# Patient Record
Sex: Male | Born: 1935 | Race: White | Hispanic: No | Smoking: Former smoker
Health system: Southern US, Community
[De-identification: ages and names within clinical notes are randomized; demographics above are authoritative.]

## PROBLEM LIST (undated history)

## (undated) DIAGNOSIS — H269 Unspecified cataract: Secondary | ICD-10-CM

## (undated) DIAGNOSIS — T7840XA Allergy, unspecified, initial encounter: Secondary | ICD-10-CM

## (undated) DIAGNOSIS — M1990 Unspecified osteoarthritis, unspecified site: Secondary | ICD-10-CM

## (undated) DIAGNOSIS — K219 Gastro-esophageal reflux disease without esophagitis: Secondary | ICD-10-CM

## (undated) HISTORY — DX: Unspecified osteoarthritis, unspecified site: M19.90

## (undated) HISTORY — PX: WRIST ARTHROPLASTY: SHX1088

## (undated) HISTORY — PX: REPLACEMENT TOTAL KNEE BILATERAL: SUR1225

## (undated) HISTORY — PX: TOTAL HIP ARTHROPLASTY: SHX124

## (undated) HISTORY — DX: Allergy, unspecified, initial encounter: T78.40XA

## (undated) HISTORY — DX: Gastro-esophageal reflux disease without esophagitis: K21.9

## (undated) HISTORY — DX: Unspecified cataract: H26.9

---

## 2012-10-10 ENCOUNTER — Ambulatory Visit: Payer: Self-pay | Admitting: Orthopedic Surgery

## 2012-10-10 DIAGNOSIS — I10 Essential (primary) hypertension: Secondary | ICD-10-CM

## 2012-10-10 LAB — BASIC METABOLIC PANEL
Anion Gap: 5 — ABNORMAL LOW (ref 7–16)
Calcium, Total: 8.8 mg/dL (ref 8.5–10.1)
Chloride: 105 mmol/L (ref 98–107)
Creatinine: 0.83 mg/dL (ref 0.60–1.30)
EGFR (Non-African Amer.): >60
Glucose: 131 mg/dL — ABNORMAL HIGH (ref 65–99)

## 2012-10-10 LAB — PROTIME-INR: Prothrombin Time: 13.5 secs (ref 11.5–14.7)

## 2012-10-10 LAB — CBC
HCT: 41.6 % (ref 40.0–52.0)
MCH: 30.6 pg (ref 26.0–34.0)
MCHC: 35.3 g/dL (ref 32.0–36.0)
Platelet: 241 10*3/uL (ref 150–440)
RDW: 13.2 % (ref 11.5–14.5)

## 2012-10-10 LAB — MRSA PCR SCREENING

## 2012-10-27 ENCOUNTER — Inpatient Hospital Stay: Payer: Self-pay | Admitting: Orthopedic Surgery

## 2012-10-28 LAB — BASIC METABOLIC PANEL
Anion Gap: 1 — ABNORMAL LOW (ref 7–16)
BUN: 10 mg/dL (ref 7–18)
Calcium, Total: 8.2 mg/dL — ABNORMAL LOW (ref 8.5–10.1)
Chloride: 107 mmol/L (ref 98–107)
Creatinine: 0.84 mg/dL (ref 0.60–1.30)
EGFR (African American): >60
EGFR (Non-African Amer.): >60
Glucose: 98 mg/dL (ref 65–99)
Osmolality: 277 (ref 275–301)
Potassium: 4 mmol/L (ref 3.5–5.1)

## 2012-10-28 LAB — PLATELET COUNT: Platelet: 188 10*3/uL (ref 150–440)

## 2012-11-01 LAB — PATHOLOGY REPORT

## 2013-03-13 ENCOUNTER — Ambulatory Visit: Payer: Self-pay | Admitting: Gastroenterology

## 2013-03-15 LAB — PATHOLOGY REPORT

## 2013-03-28 ENCOUNTER — Ambulatory Visit: Payer: Self-pay | Admitting: Orthopedic Surgery

## 2013-04-10 ENCOUNTER — Ambulatory Visit: Payer: Self-pay | Admitting: Orthopedic Surgery

## 2013-04-10 LAB — URINALYSIS, COMPLETE
Bacteria: NONE SEEN
Bilirubin,UR: NEGATIVE
Glucose,UR: NEGATIVE mg/dL (ref 0–75)
Ketone: NEGATIVE
Leukocyte Esterase: NEGATIVE
Ph: 5 (ref 4.5–8.0)
RBC,UR: 1 /HPF (ref 0–5)
Specific Gravity: 1.025 (ref 1.003–1.030)
Squamous Epithelial: <1

## 2013-04-10 LAB — PROTIME-INR: INR: 1.1

## 2013-04-10 LAB — CBC
HCT: 43.1 % (ref 40.0–52.0)
HGB: 15.1 g/dL (ref 13.0–18.0)
MCHC: 35 g/dL (ref 32.0–36.0)
Platelet: 235 10*3/uL (ref 150–440)
RDW: 14.1 % (ref 11.5–14.5)
WBC: 7.5 10*3/uL (ref 3.8–10.6)

## 2013-04-10 LAB — BASIC METABOLIC PANEL
Calcium, Total: 8.9 mg/dL (ref 8.5–10.1)
Chloride: 106 mmol/L (ref 98–107)
Co2: 33 mmol/L — ABNORMAL HIGH (ref 21–32)
EGFR (African American): >60
EGFR (Non-African Amer.): >60
Osmolality: 279 (ref 275–301)
Potassium: 3.9 mmol/L (ref 3.5–5.1)
Sodium: 139 mmol/L (ref 136–145)

## 2013-04-10 LAB — SEDIMENTATION RATE: Erythrocyte Sed Rate: 2 mm/hr (ref 0–20)

## 2013-04-10 LAB — APTT: Activated PTT: 29 secs (ref 23.6–35.9)

## 2013-04-25 ENCOUNTER — Inpatient Hospital Stay: Payer: Self-pay | Admitting: Orthopedic Surgery

## 2013-04-26 LAB — BASIC METABOLIC PANEL
Calcium, Total: 7.8 mg/dL — ABNORMAL LOW (ref 8.5–10.1)
Creatinine: 1.02 mg/dL (ref 0.60–1.30)
EGFR (Non-African Amer.): >60
Glucose: 97 mg/dL (ref 65–99)
Potassium: 3.8 mmol/L (ref 3.5–5.1)
Sodium: 139 mmol/L (ref 136–145)

## 2013-04-27 LAB — PATHOLOGY REPORT

## 2014-09-28 NOTE — Discharge Summary (Signed)
PATIENT NAME:  Donald Conley, Donald Conley MR#:  [id number] DATE OF BIRTH:  10/02/1935  DATE OF ADMISSION:  04/25/2013 DATE OF DISCHARGE:  04/27/2013  ADMITTING DIAGNOSIS: Left knee osteoarthritis.   DISCHARGE DIAGNOSIS: Left knee osteoarthritis.   PROCEDURE: Left total knee replacement.   SURGEON: Leitha Schuller, MD/[facility].   ESTIMATED BLOOD LOSS: 50 mL.   COMPLICATIONS: None.   SPECIMEN: Cut ends of bone.   TOURNIQUET TIME: 74 minutes at 300 mmHg.  IMPLANTS: Medacta GMK sphere, left 5 fixed tibial tray and a femoral left 5 sphere femoral component with a 11 x 65 extension stem, a size 5 left 10 mm Flex tibial insert, and a size 4 cemented patellar.   CONDITION: To recovery room stable.   COMPLICATIONS: None.  HISTORY: The patient is a 79 year old male who had a previous My Knee CT that showed severe osteoarthritis of the knee. He follows up to discuss surgery and schedule it. He has had persisting pain that has limited his activities of daily living.   PHYSICAL EXAMINATION: Lungs clear. Heart regular rate and rhythm. HEENT remarkable for full upper and lower dentures. On exam, he has 5 to 115 degrees with varus deformity. There is trace dorsalis pedis and posterior tibialis pulse. He has crepitation to the medial and patellofemoral joint as well as mild effusion. His varus deformity is passively correctable.   HOSPITAL COURSE: The patient was admitted to the hospital on 04/25/2013. He had surgery that same day and was brought to the orthopedic floor from the PAC-U in stable condition. On postop day 1, the patient progressed very well with physical therapy had ambulation of 280 feet and 79 degrees range of motion. His pain was well controlled. Vital signs and labs were stable. On postop day 2, the patient continued to progress with physical therapy. He was safe enough to return home with home physical therapy. He did have a bowel movement and he was ready for discharge to home with  home health PT.  CONDITION AT DISCHARGE: Stable.   DISCHARGE INSTRUCTIONS: The patient may gradually increase weight-bearing on the affected extremity and he is to elevate the affected foot or leg on 1 or 2 pillows with the foot higher than the knee. Thigh-high TED hose on both legs and remove at bedtime and replace when arising the next morning. Elevate the heels off the bed. Use incentive spirometry every hour while awake and encourage cough and deep breathing. He may resume a regular diet as tolerated. Continue using Polar Care unit maintaining a temp between 40 and 50 degrees. Do not get the dressing or bandage wet or dirty. Call [facility] orthopedics if the dressing gets water under it. Leave the dressing on. Call [facility] orthopedics if any of the following occur: Bright red bleeding from the incision or wound, fever above 101.5 degrees, redness, swelling, or drainage at the incision. Call [facility] orthopedics if you experience any increased leg pain, numbness or weakness in your legs or bowel or bladder symptoms. Home physical therapy has been arranged for continuation of rehab program. Please call [facility] orthopedics if a therapist has not contacted you within 48 hours of your return home. Call [facility] orthopedics for follow-up appointment in 2 weeks.   DISCHARGE MEDICATIONS: 1.  Fexofenadine 180 mg oral tablet 1 tablet orally once a day. 2.  Vitamin C 500 mg 1 tablet orally once a day. 3.  Calcium 600 plus D 600 mg/200 units of oral tablet 1 tablet orally  once a day. 4.  Doxazosin 8 mg 1 tab orally once a day. 5.  Fish oil 1200 mg 1 cap orally once a day. 6.  Bayer 81 mg 1 tab orally once a day. 7.  Finasteride 5 mg 1 tablet orally once a day in the morning. 8.  Simvastatin 40 mg 1/2 tab orally once a day in the morning. 9.  Omeprazole 20 mg oral delayed-release capsule 1 cap orally once a day in the morning. 10.  Tylenol 500 mg oral tablet 1 tablet orally  every 4 hours needed for pain or temp greater than 100.4.  11.  Oxycodone 5 mg 1 tablet orally every 4 hours as needed for pain. 12.  Milk of magnesia 30 mL orally 2 times a day as needed for constipation. 13.  Xarelto 10 mg oral tablet 1 tablet orally once a day in the morning x10 days. 14.  Bisacodyl 10 mg rectal suppository 1 suppository rectally once a day as needed for constipation.   ____________________________ T. Cranston Neighborhris Jeane Cashatt, PA-C tcg:sb D: 04/27/2013 10:25:27 ET T: 04/27/2013 10:52:04 ET JOB#: [id number]  cc: T. Cranston Neighborhris Bensyn Bornemann, PA-C, <Dictator> Evon SlackHOMAS C Jacobo Moncrief GeorgiaPA ELECTRONICALLY SIGNED 04/27/2013 13:18

## 2014-09-28 NOTE — Op Note (Signed)
PATIENT NAME:  Donald Conley, Donald Conley MR#:  [id number] DATE OF BIRTH:  [birth year]  DATE OF PROCEDURE:  04/25/2013  SURGEON: Kennedy Bucker, M.D.   ASSISTANT:  Cranston Neighbor, PA-C.   ANESTHESIA: Spinal.   PREOPERATIVE DIAGNOSIS: Left knee osteoarthritis.   POSTOPERATIVE DIAGNOSIS: Left knee osteoarthritis.   PROCEDURE: Left total knee replacement.   DESCRIPTION OF PROCEDURE: The patient was brought to the Operating Room and after adequate anesthesia was obtained, the left leg was prepped and draped in the usual sterile fashion with the tourniquet applied to the upper thigh. After patient identification and timeout procedures were completed and having prepped and draped in the usual sterile manner, the leg was exsanguinated with an Esmarch and the tourniquet raised to 300 mmHg. A skin incision was made over the anterior knee in the midline, followed by a medial parapatellar arthrotomy. Inspection revealed significant medial and lateral compartment degenerative change and significant loss of bone of the lateral facet of the patella with approximately half the thickness of the patella being worn. The anterior cruciate ligament and infrapatellar fat pad were excised. The proximal tibia was exposed to allow for application of the proximal tibia cutting block after excising the anterior horns of the menisci. The proximal tibia cut was carried out and most of the bone removed. Attention was then turned to the femur. Cartilage removed and the femoral cutting guide applied, drill holes made and the distal femoral cuts made. A 5 cutting block was applied, anterior and posterior chamfer cuts made without notching. The residual horns of the menisci were removed at this time along with remaining bone off the tibia. The tibial trial was placed and pinned in place, proximal drill hole made. The tibial bone was very soft, and it was deemed necessary to put a stem in to try to prevent subsidence. Preparation was done for the  stemmed implant. After this had been done, the trial was placed and followed by the 5 femur. A 10 mm insert gave excellent stability through range of motion with appropriate laxity on the lateral side and a 10 mm component was chosen. Distal femoral drill holes were made and the notch cut made with the trochlear cutting guide off of the femur. All trials were removed at this time and the patella was cut using the cutting guide. After drill holes were made a size 4 patella gave good coverage to the patella. Injection was made at this point with 0.5% Sensorcaine with morphine and Toradol and a mixture of saline in the periarticular tissue along with Exparel injection diluted with 40 mL of saline in the posterior knee as well as along the arthrotomy. Following this, the tourniquet was let down to check so hemostasis could be checked The tourniquet was then raised again. The bony surfaces thoroughly irrigated and dried. The components were then cemented into place first, first placing the tibial component. It had about 1 mm of overhang anteromedially, but otherwise had very good coverage. The tibial insert was then placed with the set screw, followed by the femoral component with excess cement removed. The knee was held in extension and full extension was obtained. The patellar button was clamped into place as well with cement to hold all components. After the cement had set, excess cement was removed and the knee was again thoroughly irrigated. The tourniquet was let down and the arthrotomy repaired using a heavy quill suture with excellent patellofemoral tracking, 2 - 0 quill subcutaneously and skin staples. Xeroform, 4 x 4's, ABD, Webril,  Polar Care and Ace wrap applied along with a knee immobilizer. The patient was sent to the recovery room in stable condition.   ESTIMATED BLOOD LOSS: 50 mL.   COMPLICATIONS: None.   SPECIMEN: Cut ends of bone.   TOURNIQUET TIME: 74 minutes at 300 mmHg.   IMPLANTS: Medacta  GMK Sphere left 5 fixed tibial tray and femoral left 5 Sphere femoral component with 11 x 65 extension stem, a size 5 left 10 mm flex tibial insert, and a size 4 cemented patella.   CONDITION: To recovery room, stable.   COMPLICATIONS: None.    ____________________________ Leitha SchullerMichael J. Ivyanna Sibert, MD mjm:cc D: 04/25/2013 21:06:20 ET T: 04/25/2013 21:43:26 ET JOB#: [id number]  cc: Leitha SchullerMichael J. Mallika Sanmiguel, MD, <Dictator> Leitha SchullerMICHAEL J Boomer Winders MD ELECTRONICALLY SIGNED 04/26/2013 8:16

## 2014-09-28 NOTE — Discharge Summary (Signed)
PATIENT NAME:  Donald Conley, Donald Conley MR#:  [id number] DATE OF BIRTH:  January 05, 1936  DATE OF ADMISSION:  10/27/2012 DATE OF DISCHARGE:  10/29/2012   ADMITTING DIAGNOSIS: Degenerative arthritis, left hip.   DISCHARGE DIAGNOSIS:  Degenerative arthritis, left hip.  OPERATION: On 10/27/2012, he had a left total hip arthroplasty.   SURGEON: Dr. Hessie Knows.  ANESTHESIA: Spinal.   ESTIMATED BLOOD LOSS: 250 mL.   DRAINS: None.   COMPLICATIONS: None.   IMPLANTS:  Medacta 3 Amis H stem, M head with 56 mm DM  liner and cup.  HISTORY: This patient is a 79 year old male who failed conservative measures for left hip degenerative arthritis. He consented for a total hip arthroplasty.   PHYSICAL EXAMINATION: LUNGS: Clear to auscultation.  HEART: Regular rate and rhythm.  HEENT: Has full upper and lower dentures.  MUSCULOSKELETAL: 10 degrees external rotation and contracture. The left hip can be actually rotated about 10 additional degrees. There is a prior right total knee with 0 to 20 degrees range of motion with stable knee and good result. On lumbar spine exam is limited flexion-extension, but no radicular symptoms with straight leg raising. No clonus.   HOSPITAL COURSE: After an initial admission on 10/27/2012, he underwent a total hip arthroplasty the same day. On postoperative day #1, 10/28/2012, his hemoglobin was 12.7 and platelets 188. He began physical therapy had good progress with therapy. He had one episode of emesis in the evening that he thought was possibly related to salad he ate. On postoperative day #2, he met his goals with physical therapy. No labs were drawn that day. He had a temperature overnight of 101.4, but this improved quickly without intervention. He tolerated therapy well on postoperative day #2, and has met his goals and is ready for discharge home.   CONDITION AT DISCHARGE: Stable.   DISPOSITION: The patient was sent home.   DISCHARGE INSTRUCTIONS: The patient will follow  up with [facility] orthopedics in 2 weeks for staple removal. He will do physical therapy and may weight-bear as tolerated on the left lower extremity. His diet is regular. TED hose knee-high bilaterally. Dressing can be changed once daily on an as needed basis.   DISCHARGE MEDICATIONS: 1.  Fexofenadine 100 mg oral tablet 1 tab p.o. once daily.  2.  Vitamin C with rose hips 500 mg oral tablet 1 tab p.o. once daily.  3.  Aspirin low dose 81 mg oral delayed release 1 tab p.o. once daily.  4.  Fish oil 1200 mg oral capsule 2 oral p.o. once daily.  5.  Omeprazole 20 mg oral delayed release one cap p.o. once daily.  6.  Calcium 600+ D 20o one tabs p.o. once daily.  7.  Finasteride 5 mg 1 tab p.o. once daily.  8.  Simvastatin 40 mg 1 tab p.o. once daily.  9.  Doxazosin 80 mg 1 tab p.o. at bedtime.  10.  Acetaminophen 500 mg 1 tab p.o. q.4h. p.r.n. pain or temperature greater than 100.4. 11.  11.  11.  Oxycodone 50 mg 1 tab p.o. q.4h. p.r.n. pain. (This prescription was placed in the chart).  12.  Magnesium hydroxide 8% oral suspension 30 mg p.o. 2 times daily p.r.n. constipation.  13. Aluminum hydroxide/magnesium hydroxide/stomach simethicone 400/400/40/5 mL oral suspension 30 mg p.o. q.6 hours p.r.n. indigestion or heartburn.  14.  Rivaroxaban 10 mg oral tablet 1 tab p.o. once daily q.a.m., x 33 days.  15.  Zolpidem 5 mg 1 tab p.o. at bedtime p.r.n. insomnia.  16.  Bisacodyl 10 mg rectal suppository, 1 tab p.o. once daily p.r.n. constipation.  17.  Docusate/senna 50 mg/8.6 mg 1 tab p.o. twice daily.    ____________________________ Kendarious Gudino M. Tretha Sciara, NP amb:ct D: 10/29/2012 08:31:15 ET T: 10/29/2012 09:46:41 ET JOB#: [id number]  cc: Domitila Stetler M. Tretha Sciara, NP, <Dictator> Laurene Footman, MD [place] FNP ELECTRONICALLY SIGNED 11/17/2012 9:36

## 2014-09-28 NOTE — Op Note (Signed)
PATIENT NAME:  Donald Conley, Donald MR#:  [id number] DATE OF BIRTH:  [birth year]  DATE OF PROCEDURE:  10/27/2012  PREOPERATIVE DIAGNOSIS: Left hip osteoarthritis.   POSTOPERATIVE DIAGNOSIS: Left hip osteoarthritis.   PROCEDURE: Left total hip replacement, anterior approach.   SURGEON: Leitha SchullerMichael J. Cottrell Gentles, MD  ASSISTANT:  Devota PaceApril Berndt, nurse practitioner.   ANESTHESIA: Spinal.   DESCRIPTION OF PROCEDURE: The patient was brought to the operating room and, after adequate spinal anesthesia was obtained, the patient was placed on the operative table with the right leg in a well legholder, left leg in the Medacta attachment. C-arm was brought in and preprocedure picture taken of the left hip. The hip was then prepped and draped using the usual sterile method. Appropriate patient identification and timeout procedures were completed. An anterolateral incision was then made over the tensor fascia muscle with the fascia incised and the muscle retracted laterally. Deep fascia was incised and the rectus sheath opened. The anterior circumflex vessels were identified and ligated. The anterior capsule was then opened with arthrotomy, opening the capsule, retractors were placed, and the femoral neck cut made. The head was then removed and there was severe degenerative change on the head and sclerotic bone around the entire acetabulum. Anterior labrum was excised and sequential reaming was carried out to 56 mm at which point there was good bleeding bone. The 56 mm trial fit well and the 56 cup was then impacted into place and was very stable with appropriate anteversion on imaging during the procedure. At this point, the pubofemoral and ischiofemoral pubofemoral ligaments were released. The leg was externally rotated and placed in extension and adduction. This provided exposure of the proximal femur. The proximal femur was opened with a box osteotome and rasp. Sequential broaching was carried out and the #3 gave a tight fit in  the canal with good rotational stability. Trials were made off of this and then a #3 stem impacted down the canal. Repeat trial was made with a medium head and leg lengths appeared equal so the medium head with 56 mm dual mobility liner were assembled, and the acetabulum checked and clear of any debris, thoroughly irrigated. The hip was reduced and stable to 90 degrees external rotation and leg lengths again appeared to be appropriate. The wound was irrigated.  Local and postop pain medication was infiltrated in the subcutaneous tissue with 30 mL of 0.25% Sensorcaine with epinephrine, Toradol, and morphine mixed with saline. The deep fascia was repaired using a heavy quill suture, 2-0 quill subcutaneously, and skin staples. Xeroform, 4 x 4's, ABD, and tape applied. The patient was sent to the recovery room in stable condition.   ESTIMATED BLOOD LOSS: 250 mL.   COMPLICATIONS: None.   SPECIMENS: Femoral head.   IMPLANTS: Medacta 3 Amis H stem with a 56 mm Versafit cup DM with a 56 mm dual mobility liner and a M 28 mm femoral head.  ____________________________ Leitha SchullerMichael J. Devarious Pavek, MD mjm:sb D: 10/27/2012 14:14:20 ET T: 10/27/2012 15:13:25 ET JOB#: [id number]  cc: Leitha SchullerMichael J. Laraina Sulton, MD, <Dictator> Leitha SchullerMICHAEL J Sherlin Sonier MD ELECTRONICALLY SIGNED 10/27/2012 17:35

## 2017-10-25 ENCOUNTER — Other Ambulatory Visit: Payer: Self-pay | Admitting: Internal Medicine

## 2017-10-25 ENCOUNTER — Ambulatory Visit
Admission: RE | Admit: 2017-10-25 | Discharge: 2017-10-25 | Disposition: A | Discharge status: Home / Self Care | Payer: Medicare Other | Source: Ambulatory Visit | Attending: Internal Medicine | Admitting: Internal Medicine

## 2017-10-25 ENCOUNTER — Encounter (INDEPENDENT_AMBULATORY_CARE_PROVIDER_SITE_OTHER): Payer: Self-pay

## 2017-10-25 DIAGNOSIS — R600 Localized edema: Secondary | ICD-10-CM | POA: Insufficient documentation

## 2017-10-26 ENCOUNTER — Other Ambulatory Visit
Admission: RE | Admit: 2017-10-26 | Discharge: 2017-10-26 | Disposition: A | Discharge status: Home / Self Care | Payer: Medicare Other | Source: Ambulatory Visit | Attending: Internal Medicine | Admitting: Internal Medicine

## 2017-10-26 ENCOUNTER — Ambulatory Visit (HOSPITAL_COMMUNITY)
Admission: RE | Admit: 2017-10-26 | Discharge: 2017-10-26 | Disposition: A | Discharge status: Home / Self Care | Payer: Medicare Other | Source: Ambulatory Visit | Attending: Internal Medicine | Admitting: Internal Medicine

## 2017-10-26 ENCOUNTER — Other Ambulatory Visit (HOSPITAL_COMMUNITY): Payer: Self-pay | Admitting: Internal Medicine

## 2017-10-26 DIAGNOSIS — I700 Atherosclerosis of aorta: Secondary | ICD-10-CM | POA: Insufficient documentation

## 2017-10-26 DIAGNOSIS — R600 Localized edema: Secondary | ICD-10-CM | POA: Diagnosis present

## 2017-10-26 DIAGNOSIS — R918 Other nonspecific abnormal finding of lung field: Secondary | ICD-10-CM | POA: Diagnosis not present

## 2017-10-26 DIAGNOSIS — K229 Disease of esophagus, unspecified: Secondary | ICD-10-CM | POA: Insufficient documentation

## 2017-10-26 DIAGNOSIS — I2510 Atherosclerotic heart disease of native coronary artery without angina pectoris: Secondary | ICD-10-CM | POA: Diagnosis not present

## 2017-10-26 DIAGNOSIS — R0602 Shortness of breath: Secondary | ICD-10-CM

## 2017-10-26 DIAGNOSIS — R7989 Other specified abnormal findings of blood chemistry: Secondary | ICD-10-CM

## 2017-10-26 DIAGNOSIS — E041 Nontoxic single thyroid nodule: Secondary | ICD-10-CM | POA: Diagnosis not present

## 2017-10-26 LAB — POCT I-STAT, CHEM 8
BUN: 11 mg/dL (ref 6–20)
CHLORIDE: 101 mmol/L (ref 101–111)
CREATININE: 0.9 mg/dL (ref 0.61–1.24)
Calcium, Ion: 1.17 mmol/L (ref 1.15–1.40)
GLUCOSE: 107 mg/dL — AB (ref 65–99)
HEMATOCRIT: 42 % (ref 39.0–52.0)
HEMOGLOBIN: 14.3 g/dL (ref 13.0–17.0)
POTASSIUM: 3.8 mmol/L (ref 3.5–5.1)
Sodium: 138 mmol/L (ref 135–145)
TCO2: 26 mmol/L (ref 22–32)

## 2017-10-26 LAB — FIBRIN DERIVATIVES D-DIMER (ARMC ONLY): FIBRIN DERIVATIVES D-DIMER (ARMC): 666.03 ng{FEU}/mL — AB (ref 0.00–499.00)

## 2017-10-26 MED ORDER — IOPAMIDOL (ISOVUE-370) INJECTION 76%
100.0000 mL | Freq: Once | INTRAVENOUS | Status: AC | PRN
  Administered 2017-10-26: 100 mL via INTRAVENOUS

## 2017-10-27 ENCOUNTER — Inpatient Hospital Stay: Payer: Medicare Other | Attending: Hematology and Oncology | Admitting: Hematology and Oncology

## 2017-10-27 ENCOUNTER — Encounter: Payer: Self-pay | Admitting: Hematology and Oncology

## 2017-10-27 ENCOUNTER — Other Ambulatory Visit: Payer: Self-pay

## 2017-10-27 ENCOUNTER — Inpatient Hospital Stay: Payer: Medicare Other

## 2017-10-27 VITALS — BP 152/83 | HR 85 | Temp 96.8°F | Resp 18 | Ht 67.0 in | Wt 210.1 lb

## 2017-10-27 DIAGNOSIS — Z87891 Personal history of nicotine dependence: Secondary | ICD-10-CM

## 2017-10-27 DIAGNOSIS — R351 Nocturia: Secondary | ICD-10-CM | POA: Diagnosis not present

## 2017-10-27 DIAGNOSIS — Z7982 Long term (current) use of aspirin: Secondary | ICD-10-CM | POA: Diagnosis not present

## 2017-10-27 DIAGNOSIS — I2782 Chronic pulmonary embolism: Secondary | ICD-10-CM | POA: Diagnosis present

## 2017-10-27 DIAGNOSIS — R233 Spontaneous ecchymoses: Secondary | ICD-10-CM | POA: Insufficient documentation

## 2017-10-27 DIAGNOSIS — M2550 Pain in unspecified joint: Secondary | ICD-10-CM | POA: Insufficient documentation

## 2017-10-27 DIAGNOSIS — I2690 Septic pulmonary embolism without acute cor pulmonale: Secondary | ICD-10-CM

## 2017-10-27 DIAGNOSIS — Z77090 Contact with and (suspected) exposure to asbestos: Secondary | ICD-10-CM | POA: Diagnosis not present

## 2017-10-27 DIAGNOSIS — Z807 Family history of other malignant neoplasms of lymphoid, hematopoietic and related tissues: Secondary | ICD-10-CM | POA: Insufficient documentation

## 2017-10-27 DIAGNOSIS — Z8042 Family history of malignant neoplasm of prostate: Secondary | ICD-10-CM | POA: Diagnosis not present

## 2017-10-27 DIAGNOSIS — R600 Localized edema: Secondary | ICD-10-CM | POA: Insufficient documentation

## 2017-10-27 DIAGNOSIS — Z808 Family history of malignant neoplasm of other organs or systems: Secondary | ICD-10-CM | POA: Insufficient documentation

## 2017-10-27 DIAGNOSIS — R05 Cough: Secondary | ICD-10-CM | POA: Diagnosis not present

## 2017-10-27 DIAGNOSIS — R0789 Other chest pain: Secondary | ICD-10-CM | POA: Insufficient documentation

## 2017-10-27 DIAGNOSIS — R0602 Shortness of breath: Secondary | ICD-10-CM | POA: Insufficient documentation

## 2017-10-27 LAB — CBC WITH DIFFERENTIAL/PLATELET
Basophils Absolute: 0.1 10*3/uL (ref 0–0.1)
Basophils Relative: 1 %
Eosinophils Absolute: 0.5 10*3/uL (ref 0–0.7)
Eosinophils Relative: 6 %
HEMATOCRIT: 46.2 % (ref 40.0–52.0)
Hemoglobin: 16.3 g/dL (ref 13.0–18.0)
LYMPHS PCT: 20 %
Lymphs Abs: 1.7 10*3/uL (ref 1.0–3.6)
MCH: 30.9 pg (ref 26.0–34.0)
MCHC: 35.2 g/dL (ref 32.0–36.0)
MCV: 87.7 fL (ref 80.0–100.0)
Monocytes Absolute: 0.7 10*3/uL (ref 0.2–1.0)
Monocytes Relative: 8 %
NEUTROS ABS: 5.8 10*3/uL (ref 1.4–6.5)
Neutrophils Relative %: 65 %
PLATELETS: 263 10*3/uL (ref 150–440)
RBC: 5.27 MIL/uL (ref 4.40–5.90)
RDW: 13.3 % (ref 11.5–14.5)
WBC: 8.9 10*3/uL (ref 3.8–10.6)

## 2017-10-27 NOTE — Progress Notes (Signed)
[facility]-  Cancer Center  Clinic day:  10/27/2017  Chief Complaint: Donald Conley is a 82 y.o. male with a pulmonary embolism who is referred in consultation by Dr. Einar Crow for assessment and management.  HPI:   The patient notes shortness of breath that developed gradually over the course of a month. Family notes that patient is normally "hyper", but as of late, he has been sitting around more. Patient experiences chest pain after eating and sometimes late at night. Patient denies radiation of pain into his arms, neck, and subscapular area. Patient has "weird feelings" in his chest at times. When this happens, he hits himself in the chest and the sensation resolved.   Labs on 10/26/2017 revealed a D-dimer of 666.03 (0-499).  Creatinine was 0.90.  PSA was 0.45.  CMP on 07/12/2017 revealed a creatinine of 0.8 and normal LFTs.  Left lower extremity duplex on 10/26/2017 revealed no evidence of DVT.  Chest CT angiogram on 10/26/2017 revealed a 3 mm focus of what was felt to represent chronic nonobstructing thrombus/embolus in a subsegmental branch of the right pulmonary artery.  There was no other evidence suggesting thrombus or embolus in the pulmonary arterial system. Significance of this finding clinically was uncertain given its small size and likely chronic nature.  There was no thoracic aortic aneurysm or dissection. There was a 4 mm nodular opacity inferior lingula.  There was no thoracic adenopathy.  There was mild generalized esophageal wall thickening, a finding likely representing chronic reflux esophagitis.  There was a subcentimeter nodular opacity in the left lobe of the thyroid.  Patient underwent a nuclear stress test last week. He has an echocardiogram planned for next week. He is scheduled to be seen in consult by cardiology (provider unknown) and pulmonology (Dr. Meredeth Ide).   Symptomatically, he has been having increased sweating at night. He will wake  up and his sheets are "damp".  Patient has had a chronic cough "all of his life". Patient is a former 4 pack per day smoker for 6-7 years. He quit 50 years ago. 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  Pain or cough is not reproducible with deep inspiration. He denies pleuritic chest pain.   Last colonoscopy was about 5 years ago. He has chronic arthritic pain in his lower extremities. He is status post BILATERAL knee and LEFT replacements. Patient notes that he "bruises and bleeds" a lot. He is on daily ASA 81 mg. Patient has swelling to his LEFT lower extremity. He experiences intermittent contralateral edema at times that he attributes to wearing "tight socks". Patient denies claudication pain.   Patient's father had a history of prostate and bone cancer. His son (age 73) has Hodgkin's lymphoma, and his daughter has a history of thyroid cancer.    Past Medical History:  Diagnosis Date  . Allergy   . Arthritis   . Cataract   . GERD (gastroesophageal reflux disease)     Past Surgical History:  Procedure Laterality Date  . REPLACEMENT TOTAL KNEE BILATERAL    . TOTAL HIP ARTHROPLASTY Left   . WRIST ARTHROPLASTY Right     Family History  Problem Relation Age of Onset  . Cancer Father     Social History:  has no tobacco, alcohol, and drug history on file.  Patient is a former 4 pack per day smoker for 6-7 years. He quit 50 years ago. 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  No other radiation or toxin exposure. Patient lives in [place], [place], with his daughter. The patient is accompanied by his daughter, [name], today.  Allergies: No Known Allergies  Current Medications: Current Outpatient Medications  Medication Sig Dispense Refill  . acetaminophen (TYLENOL) 650 MG CR tablet Take 650 mg by mouth as needed.    . Ascorbic Acid (VITAMIN C) 1000 MG tablet Take 1,000 mg by mouth  daily.    Marland Kitchen aspirin 81 MG chewable tablet Chew 81 mg by mouth daily.    . Calcium Carb-Cholecalciferol (CALCIUM 1000 + D PO) Take 1 tablet by mouth daily.    . Cholecalciferol (VITAMIN D-1000 MAX ST) 1000 units tablet Take 1,000 Units by mouth daily.    Marland Kitchen docusate sodium (COLACE) 100 MG capsule Take 100 mg by mouth daily.    Marland Kitchen doxazosin (CARDURA) 8 MG tablet Take 8 mg by mouth daily.    . fexofenadine (ALLEGRA) 180 MG tablet Take 180 mg by mouth daily.    . finasteride (PROSCAR) 5 MG tablet Take 5 mg by mouth daily.    . Omega-3 Fatty Acids (KP FISH OIL) 1200 MG CAPS Take 1,200 mg by mouth daily.    Marland Kitchen omeprazole (PRILOSEC) 20 MG capsule Take 20 mg by mouth daily.    . simvastatin (ZOCOR) 20 MG tablet Take 20 mg by mouth daily.     No current facility-administered medications for this visit.     Review of Systems  Constitutional: Positive for diaphoresis (damp at night). Negative for fever and weight loss.  HENT: Negative.   Eyes: Negative.   Respiratory: Positive for cough and shortness of breath (exertional). Negative for hemoptysis and sputum production.        Works with sheet rock. Previous exposures to asbestos.  Cardiovascular: Positive for chest pain, palpitations and leg swelling. Negative for orthopnea and PND.  Gastrointestinal: Positive for constipation (now and then). Negative for abdominal pain, blood in stool, diarrhea, melena, nausea and vomiting.  Genitourinary: Positive for frequency (nocturia). Negative for dysuria, hematuria and urgency.  Musculoskeletal: Positive for joint pain. Negative for back pain, falls and myalgias.  Skin: Negative for itching and rash.  Neurological: Negative for dizziness, tremors, weakness and headaches.  Endo/Heme/Allergies: Bruises/bleeds easily.  Psychiatric/Behavioral: Negative for depression, memory loss and suicidal ideas. The patient is not nervous/anxious and does not have insomnia.   All other systems reviewed and are  negative.  Physical Exam: Blood pressure (!) 152/83, pulse 85, temperature (!) 96.8 F (36 C), temperature source Tympanic, resp. rate 18, height 5\' 7"  (1.702 m), weight 210 lb 1.6 oz (95.3 kg), SpO2 97 %. GENERAL:  Well developed, well nourished, gentleman sitting comfortably in the exam room in no acute distress. MENTAL STATUS:  Alert and oriented to person, place and time. HEAD:  Wallace Cullens hair.  Normocephalic, atraumatic, face symmetric, no Cushingoid features. EYES:  Blue eyes.  Pupils equal round and reactive to light and accomodation.  No conjunctivitis or scleral icterus. ENT:  Oropharynx clear without lesion.  Tongue normal. Mucous membranes moist.  RESPIRATORY:  Clear to auscultation without rales, wheezes or rhonchi. CARDIOVASCULAR:  Regular rate and rhythm without murmur, rub or gallop. ABDOMEN:  Ventral hernia.  Soft, non-tender, with active bowel sounds, and no hepatosplenomegaly.  No masses. SKIN:  No rashes, ulcers or lesions.  Upper extremity scratches. EXTREMITIES:  1+ left lower extremity edema.  No skin discoloration or tenderness.  No palpable cords.  No Homan's  sign. LYMPH NODES: No palpable cervical, supraclavicular, axillary or inguinal adenopathy  NEUROLOGICAL: Unremarkable. PSYCH:  Appropriate.   Appointment on 10/27/2017  Component Date Value Ref Range Status  . Beta-2 Glyco I IgG 10/27/2017 <9  0 - 20 GPI IgG units Final   Comment: (NOTE) The reference interval reflects a 3SD or 99th percentile interval, which is thought to represent a potentially clinically significant result in accordance with the International Consensus Statement on the classification criteria for definitive antiphospholipid syndrome (APS). J Thromb Haem 2006;4:295-306.   . Beta-2-Glycoprotein I IgM 10/27/2017 <9  0 - 32 GPI IgM units Final   Comment: (NOTE) The reference interval reflects a 3SD or 99th percentile interval, which is thought to represent a potentially clinically  significant result in accordance with the International Consensus Statement on the classification criteria for definitive antiphospholipid syndrome (APS). J Thromb Haem 2006;4:295-306. Performed At: [facility] [address] [place], [place] [id number] Jolene Schimke MD FA:[id number]   . Beta-2-Glycoprotein I IgA 10/27/2017 <9  0 - 25 GPI IgA units Final   Comment: (NOTE) The reference interval reflects a 3SD or 99th percentile interval, which is thought to represent a potentially clinically significant result in accordance with the International Consensus Statement on the classification criteria for definitive antiphospholipid syndrome (APS). J Thromb Haem 2006;4:295-306. Performed at [facility], [address] [place], [place] [address]   . Anticardiolipin IgG 10/27/2017 <9  0 - 14 GPL U/mL Final   Comment: (NOTE)                          Negative:              <15                          Indeterminate:     15 - 20                          Low-Med Positive: >20 - 80                          High Positive:         >80   . Anticardiolipin IgM 10/27/2017 <9  0 - 12 MPL U/mL Final   Comment: (NOTE)                          Negative:              <13                          Indeterminate:     13 - 20                          Low-Med Positive: >20 - 80                          High Positive:         >80   . Anticardiolipin IgA 10/27/2017 <9  0 - 11 APL U/mL Final   Comment: (NOTE)                          Negative:              <  12                          Indeterminate:     12 - 20                          Low-Med Positive: >20 - 80                          High Positive:         >80 Performed At: [facility] [address] [place], [place] [id number] Jolene Schimke MD WU:[id number] Performed at [facility], [address], [place], [place] [address]   . PTT Lupus Anticoagulant 10/27/2017 30.4  0.0 - 51.9 sec Final  . DRVVT  10/27/2017 34.2  0.0 - 47.0 sec Final  . Lupus Anticoag Interp 10/27/2017 Comment:   Corrected   Comment: (NOTE) No lupus anticoagulant was detected. Performed At: [facility] [address] [place], [place] [id number] Jolene Schimke MD NG:[id number] Performed at [facility], [address], [place], [place] [address]   . Recommendations-PTGENE: 10/27/2017 Comment   Final   Comment: (NOTE) NEGATIVE No mutation identified. Comment: A point mutation ([id number]) in the factor II (prothrombin) gene is the second most common cause of inherited thrombophilia. The incidence of this mutation in the [place] Caucasian population is about 2% and in the [place] American population it is approximately 0.5%. This mutation is rare in the [place] and Native American population. Being heterozygous for a prothrombin mutation increases the risk for developing venous thrombosis about 2 to 3 times above the general population risk. Being homozygous for the prothrombin gene mutation increases the relative risk for venous thrombosis further, although it is not yet known how much further the risk is increased. In women heterozygous for the prothrombin gene mutation, the use of estrogen containing oral contraceptives increases the relative risk of venous thrombosis about 16 times and the risk of developing cerebral thrombosis is also significantly increased. In pregnancy the pr                          othrombin gene mutation increases risk for venous thrombosis and may increase risk for stillbirth, placental abruption, pre-eclampsia and fetal growth restriction. If the patient possesses two or more congenital or acquired thrombophilic risk factors, the risk for thrombosis may rise to more than the sum of the risk ratios for the individual mutations. This assay detects only the prothrombin G20210A mutation and does not measure genetic abnormalities elsewhere in the genome. Other  thrombotic risk factors may be pursued through systematic clinical laboratory analysis. These factors include the R506Q (Leiden) mutation in the Factor V gene, plasma homocysteine levels, as well as testing for deficiencies of antithrombin III, protein C and protein S. Genetic Counselors are available for health care providers to discuss results at [phone]). Methodology: DNA analysis of the Factor II gene was performed by PCR amplification followed by restriction analysis. The di                          agnostic sensitivity is >99% for both. All the tests must be combined with clinical information for the most accurate interpretation. Molecular-based testing is highly accurate, but as in any laboratory test, diagnostic errors may occur. This test was developed and its  performance characteristics determined by [facility]. It has not been cleared or approved by the Food and Drug Administration. Poort SR, et al. Blood. 1996; 16:1096-0454. Varga EA. Circulation. 2004; 110:e15-e18. Marland Kitchen, et al. Arterioscler Thromb Vasc Biol. [phone]. Vincenza Hews, PhD, [facility] Ernestene Mention, PhD, [facility] Wyatt Portela, M.S., PhD, [facility] Manya Silvas, PhD, [facility] Bonnielee Haff, PhD, [facility] Alpha Gula, PhD, [facility] Performed At: [facility] [address] [place], [place] [id number] Oley Balm MD HQ:[id number] Performed at [facility], [address] [place], [place] [address]   . WBC 10/27/2017 8.9  3.8 - 10.6 K/uL Final  . RBC 10/27/2017 5.27  4.40 - 5.90 MIL/uL Final  . Hemoglobin 10/27/2017 16.3  13.0 - 18.0 g/dL Final  . HCT 06/10/7251 46.2  40.0 - 52.0 % Final  . MCV 10/27/2017 87.7  80.0 - 100.0 fL Final  . MCH 10/27/2017 30.9  26.0 - 34.0 pg Final  . MCHC 10/27/2017 35.2  32.0 - 36.0 g/dL Final  . RDW 66/44/0347 13.3  11.5 - 14.5 % Final  . Platelets 10/27/2017 263  150 - 440 K/uL Final  . Neutrophils Relative % 10/27/2017 65  %  Final  . Neutro Abs 10/27/2017 5.8  1.4 - 6.5 K/uL Final  . Lymphocytes Relative 10/27/2017 20  % Final  . Lymphs Abs 10/27/2017 1.7  1.0 - 3.6 K/uL Final  . Monocytes Relative 10/27/2017 8  % Final  . Monocytes Absolute 10/27/2017 0.7  0.2 - 1.0 K/uL Final  . Eosinophils Relative 10/27/2017 6  % Final  . Eosinophils Absolute 10/27/2017 0.5  0 - 0.7 K/uL Final  . Basophils Relative 10/27/2017 1  % Final  . Basophils Absolute 10/27/2017 0.1  0 - 0.1 K/uL Final   Performed at [facility], [address] [place], [place] [address]  Hospital Outpatient Visit on 10/26/2017  Component Date Value Ref Range Status  . Sodium 10/26/2017 138  135 - 145 mmol/L Final  . Potassium 10/26/2017 3.8  3.5 - 5.1 mmol/L Final  . Chloride 10/26/2017 101  101 - 111 mmol/L Final  . BUN 10/26/2017 11  6 - 20 mg/dL Final  . Creatinine, Ser 10/26/2017 0.90  0.61 - 1.24 mg/dL Final  . Glucose, Bld 63/87/5643 107* 65 - 99 mg/dL Final  . Calcium, Ion 32/95/1884 1.17  1.15 - 1.40 mmol/L Final  . TCO2 10/26/2017 26  22 - 32 mmol/L Final  . Hemoglobin 10/26/2017 14.3  13.0 - 17.0 g/dL Final  . HCT 16/60/6301 42.0  39.0 - 52.0 % Final  Hospital Outpatient Visit on 10/26/2017  Component Date Value Ref Range Status  . Fibrin derivatives D-dimer ([facility]) 10/26/2017 666.03* 0.00 - 499.00 ng/mL (FEU) Final   Comment: (NOTE) <> Exclusion of Venous Thromboembolism (VTE) - OUTPATIENT ONLY   (Emergency Department or [place])   0-499 ng/ml (FEU): With a low to intermediate pretest probability                      for VTE this test result excludes the diagnosis                      of VTE.   >499 ng/ml (FEU) : VTE not excluded; additional work up for VTE is                      required. <> Testing on Inpatients and Evaluation of Disseminated Intravascular  Coagulation (DIC) Reference Range:   0-499 ng/ml (FEU) Performed at [facility], [address] [place] [place], [place] [address]     Assessment:   Donald Conley is a 82 y.o. male with small pulmonary embolism (chronic) discovered on evaluation of shortness of breath.  D-dimer was 666.03 (0-499 on 10/26/2017.  Chest CT angiogram on 10/26/2017 revealed a 3 mm focus of what was felt to represent chronic nonobstructing thrombus/embolus in a subsegmental branch of the right pulmonary artery.  There was no other evidence suggesting thrombus or embolus in the pulmonary arterial system. Significance of this finding clinically was uncertain given its small size and likely chronic nature.  There was no thoracic aortic aneurysm or dissection. There was a 4 mm nodular opacity inferior lingula.  There was no thoracic adenopathy.  There was mild generalized esophageal wall thickening, a finding likely representing chronic reflux esophagitis.  There was a subcentimeter nodular opacity in the left lobe of the thyroid.  Left lower extremity duplex on 10/26/2017 revealed no evidence of DVT.  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  He has a 24-28 pack year smoking history.  Symptomatically, he has shortness of breath.  He has a chronic cough.  Exam reveals mild LLE edema.  Plan: 1. Labs today: CBC with diff, Factor V Leiden, prothrombin gene mutation, anticardiolipin antibodies, beta-2 glycoprotein, lupus anticoagulant panel 2. Discuss pulmonary embolism. Likely chronic in nature. Thrombus is 3 mm in size and  felt not to be causing patient's symptoms. Will perform limited hypercoagulable workup to further assess for any significant risk factors of thrombosis.  Do not feel patient requires anticoagulation unless hypercoagulable work-up concerning. 3. Discuss elevated D-dimer.  Differential is broad and includes: MI, CVA, atrial fibrillation, CVD, CHF, infection/sepsis, trauma, liver disease, kidney disease, malignancy as well as DVT/PE.  D-dimer on 10/26/2017 was only mildly elevated. 4. Discuss shortness of breath. Patient is  scheduled to see both cardiology and pulmonology in consult for further evaluation and testing.  5.   RTC in 1 week for MD assessment and review of workup and discussion regarding direction of therapy.    Quentin Mulling, NP  10/27/2017, 4:22 PM   I saw and evaluated the patient, participating in the key portions of the service and reviewing pertinent diagnostic studies and records.  I reviewed the nurse practitioner's note and agree with the findings and the plan.  The assessment and plan were discussed with the patient.  Multiple questions were asked by the patient and answered.   Nelva Nay, MD 10/27/2017,4:22 PM

## 2017-10-27 NOTE — Progress Notes (Signed)
Patient here today as new evaluation regarding SOB.  Referred by Dr. Dareen Piano @ [facility].  Patient accompanied by his daughter, Gerome Apley,  today.

## 2017-10-28 LAB — LUPUS ANTICOAGULANT PANEL
DRVVT: 34.2 s (ref 0.0–47.0)
PTT Lupus Anticoagulant: 30.4 s (ref 0.0–51.9)

## 2017-10-29 LAB — BETA-2-GLYCOPROTEIN I ABS, IGG/M/A: Beta-2-Glycoprotein I IgA: <9 GPI IgA units (ref 0–25)

## 2017-10-31 LAB — CARDIOLIPIN ANTIBODIES, IGG, IGM, IGA
Anticardiolipin IgA: <9 APL U/mL (ref 0–11)
Anticardiolipin IgG: <9 GPL U/mL (ref 0–14)
Anticardiolipin IgM: <9 MPL U/mL (ref 0–12)

## 2017-11-02 LAB — PROTHROMBIN GENE MUTATION

## 2017-11-03 ENCOUNTER — Encounter: Payer: Self-pay | Admitting: Hematology and Oncology

## 2017-11-03 ENCOUNTER — Inpatient Hospital Stay (HOSPITAL_BASED_OUTPATIENT_CLINIC_OR_DEPARTMENT_OTHER): Payer: Medicare Other | Admitting: Hematology and Oncology

## 2017-11-03 VITALS — BP 143/79 | HR 82 | Temp 97.7°F | Resp 18 | Wt 214.5 lb

## 2017-11-03 DIAGNOSIS — Z87891 Personal history of nicotine dependence: Secondary | ICD-10-CM | POA: Diagnosis not present

## 2017-11-03 DIAGNOSIS — R233 Spontaneous ecchymoses: Secondary | ICD-10-CM | POA: Diagnosis not present

## 2017-11-03 DIAGNOSIS — R600 Localized edema: Secondary | ICD-10-CM | POA: Diagnosis not present

## 2017-11-03 DIAGNOSIS — R351 Nocturia: Secondary | ICD-10-CM | POA: Diagnosis not present

## 2017-11-03 DIAGNOSIS — M2550 Pain in unspecified joint: Secondary | ICD-10-CM

## 2017-11-03 DIAGNOSIS — R05 Cough: Secondary | ICD-10-CM

## 2017-11-03 DIAGNOSIS — R0602 Shortness of breath: Secondary | ICD-10-CM | POA: Diagnosis not present

## 2017-11-03 DIAGNOSIS — Z77090 Contact with and (suspected) exposure to asbestos: Secondary | ICD-10-CM | POA: Diagnosis not present

## 2017-11-03 DIAGNOSIS — I2782 Chronic pulmonary embolism: Secondary | ICD-10-CM

## 2017-11-03 DIAGNOSIS — R0789 Other chest pain: Secondary | ICD-10-CM

## 2017-11-03 LAB — FACTOR 5 LEIDEN

## 2017-11-03 NOTE — Progress Notes (Signed)
Patient offers no complaints today. 

## 2017-11-03 NOTE — Progress Notes (Signed)
[facility]-  Cancer Center  Clinic day:  11/03/2017   Chief Complaint: Donald Conley is a 82 y.o. male with a pulmonary embolism who is seen for review of work-up and discussion regarding direction of therapy.  HPI:   The patient was last seen in the hematology clinic on 10/27/2017 for initial consultation.  On a work-up for shortness of breath, chest CT angiogram revealed a 3 mm focus of chronic non-obstructing thrombus/embolus in a subsegmental branch of the pulmonary artery.  Significance was unclear.  He has mild left lower extremity edema.  LLE duplex revealed no DVT.  D-dimers were mildly elevated.  Labs on 10/27/2017 revealed a hematocrit of 46.6, hemoglobin 16.3, MCV 87.7, platelets 263,000, WBC 8900 with an ANC of 5800.  Negative studies included: prothrombin gene mutation, lupus anticoagulant panel, beta-2 glycoprotein, anticardiolipin antibodies.  Factor V Leiden is pending.  During the interim, he denies any new complaints.  He denies any shortness of breath or cough.  He denies any change in LLE edema.   Past Medical History:  Diagnosis Date  . Allergy   . Arthritis   . Cataract   . GERD (gastroesophageal reflux disease)     Past Surgical History:  Procedure Laterality Date  . REPLACEMENT TOTAL KNEE BILATERAL    . TOTAL HIP ARTHROPLASTY Left   . WRIST ARTHROPLASTY Right     Family History  Problem Relation Age of Onset  . Cancer Father     Social History:  reports that he has quit smoking. He has a 24.00 pack-year smoking history. He has never used smokeless tobacco. His alcohol and drug histories are not on file.  Patient is a former 4 pack per day smoker for 6-7 years. He quit 50 years ago. 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  No other radiation or toxin exposure. Patient lives in [place], [place], with his daughter. The patient is accompanied by his daughter, Donald Conley, today.  Allergies: No Known  Allergies  Current Medications: Current Outpatient Medications  Medication Sig Dispense Refill  . acetaminophen (TYLENOL) 650 MG CR tablet Take 650 mg by mouth as needed.    . Ascorbic Acid (VITAMIN C) 1000 MG tablet Take 1,000 mg by mouth daily.    Marland Kitchen aspirin 81 MG chewable tablet Chew 81 mg by mouth daily.    . Calcium Carb-Cholecalciferol (CALCIUM 1000 + D PO) Take 1 tablet by mouth daily.    . Cholecalciferol (VITAMIN D-1000 MAX ST) 1000 units tablet Take 1,000 Units by mouth daily.    Marland Kitchen docusate sodium (COLACE) 100 MG capsule Take 100 mg by mouth daily.    Marland Kitchen doxazosin (CARDURA) 8 MG tablet Take 8 mg by mouth daily.    . fexofenadine (ALLEGRA) 180 MG tablet Take 180 mg by mouth daily.    . finasteride (PROSCAR) 5 MG tablet Take 5 mg by mouth daily.    . Omega-3 Fatty Acids (KP FISH OIL) 1200 MG CAPS Take 1,200 mg by mouth daily.    Marland Kitchen omeprazole (PRILOSEC) 20 MG capsule Take 20 mg by mouth daily.    . simvastatin (ZOCOR) 20 MG tablet Take 20 mg by mouth daily.     No current facility-administered medications for this visit.     Review of Systems  Constitutional: Positive for diaphoresis (damp at night). Negative for fever and weight loss.  HENT: Negative.  Negative for congestion, ear pain, nosebleeds and sore throat.   Eyes: Negative.  Negative for  blurred vision, discharge and redness.  Respiratory: Positive for cough and shortness of breath (exertional). Negative for hemoptysis and sputum production.        Works with sheet rock. Previous exposures to asbestos.  Cardiovascular: Positive for chest pain, palpitations and leg swelling. Negative for orthopnea and PND.  Gastrointestinal: Positive for constipation (now and then). Negative for abdominal pain, blood in stool, diarrhea, melena, nausea and vomiting.  Genitourinary: Positive for frequency (nocturia). Negative for dysuria, hematuria and urgency.  Musculoskeletal: Positive for joint pain. Negative for back pain, falls and  myalgias.  Skin: Negative.  Negative for itching and rash.  Neurological: Negative.  Negative for dizziness, tremors, focal weakness, weakness and headaches.  Endo/Heme/Allergies: Bruises/bleeds easily.  Psychiatric/Behavioral: Negative for depression and memory loss. The patient is not nervous/anxious and does not have insomnia.   All other systems reviewed and are negative.  Physical Exam: Blood pressure (!) 143/79, pulse 82, temperature 97.7 F (36.5 C), temperature source Tympanic, resp. rate 18, weight 214 lb 8.1 oz (97.3 kg), SpO2 99 %. GENERAL:  Well developed, well nourished, gentleman sitting comfortably in the exam room in no acute distress. MENTAL STATUS:  Alert and oriented to person, place and time. HEAD:  Wallace Cullens hair.  Normocephalic, atraumatic, face symmetric, no Cushingoid features. EYES:  Blue eyes. No conjunctivitis or scleral icterus. EXTREMITIES: 1+ left lower extremity edema. NEUROLOGICAL: Unremarkable. PSYCH:  Appropriate.    No visits with results within 3 Day(s) from this visit.  Latest known visit with results is:  Appointment on 10/27/2017  Component Date Value Ref Range Status  . Beta-2 Glyco I IgG 10/27/2017 <9  0 - 20 GPI IgG units Final   Comment: (NOTE) The reference interval reflects a 3SD or 99th percentile interval, which is thought to represent a potentially clinically significant result in accordance with the International Consensus Statement on the classification criteria for definitive antiphospholipid syndrome (APS). J Thromb Haem 2006;4:295-306.   . Beta-2-Glycoprotein I IgM 10/27/2017 <9  0 - 32 GPI IgM units Final   Comment: (NOTE) The reference interval reflects a 3SD or 99th percentile interval, which is thought to represent a potentially clinically significant result in accordance with the International Consensus Statement on the classification criteria for definitive antiphospholipid syndrome (APS). J Thromb Haem  2006;4:295-306. Performed At: [facility] [address] [place], [place] [id number] Jolene Schimke MD [id number]   . Beta-2-Glycoprotein I IgA 10/27/2017 <9  0 - 25 GPI IgA units Final   Comment: (NOTE) The reference interval reflects a 3SD or 99th percentile interval, which is thought to represent a potentially clinically significant result in accordance with the International Consensus Statement on the classification criteria for definitive antiphospholipid syndrome (APS). J Thromb Haem 2006;4:295-306. Performed at [facility], [address] [place], [place] [address]   . Anticardiolipin IgG 10/27/2017 <9  0 - 14 GPL U/mL Final   Comment: (NOTE)                          Negative:              <15                          Indeterminate:     15 - 20                          Low-Med Positive: >20 -  80                          High Positive:         >80   . Anticardiolipin IgM 10/27/2017 <9  0 - 12 MPL U/mL Final   Comment: (NOTE)                          Negative:              <13                          Indeterminate:     13 - 20                          Low-Med Positive: >20 - 80                          High Positive:         >80   . Anticardiolipin IgA 10/27/2017 <9  0 - 11 APL U/mL Final   Comment: (NOTE)                          Negative:              <12                          Indeterminate:     12 - 20                          Low-Med Positive: >20 - 80                          High Positive:         >80 Performed At: [facility] [address] [place], [place] [id number] Jolene Schimke MD WU:[id number] Performed at [facility], [address], [place], [place] [address]   . PTT Lupus Anticoagulant 10/27/2017 30.4  0.0 - 51.9 sec Final  . DRVVT 10/27/2017 34.2  0.0 - 47.0 sec Final  . Lupus Anticoag Interp 10/27/2017 Comment:   Corrected   Comment: (NOTE) No lupus anticoagulant was detected. Performed At: [facility] [address] [place], [place] [id number] Jolene Schimke MD NG:[id number] Performed at [facility], [address], [place], [place] [address]   . Recommendations-PTGENE: 10/27/2017 Comment   Final   Comment: (NOTE) NEGATIVE No mutation identified. Comment: A point mutation (G20210A) in the factor II (prothrombin) gene is the second most common cause of inherited thrombophilia. The incidence of this mutation in the [place] Caucasian population is about 2% and in the [place] American population it is approximately 0.5%. This mutation is rare in the [place] and Native American population. Being heterozygous for a prothrombin mutation increases the risk for developing venous thrombosis about 2 to 3 times above the general population risk. Being homozygous for the prothrombin gene mutation increases the relative risk for venous thrombosis further, although it is not yet known how much further the risk is increased. In women heterozygous for the prothrombin gene mutation, the use of estrogen containing oral contraceptives increases the relative risk of venous thrombosis about 16 times and  the risk of developing cerebral thrombosis is also significantly increased. In pregnancy the pr                          othrombin gene mutation increases risk for venous thrombosis and may increase risk for stillbirth, placental abruption, pre-eclampsia and fetal growth restriction. If the patient possesses two or more congenital or acquired thrombophilic risk factors, the risk for thrombosis may rise to more than the sum of the risk ratios for the individual mutations. This assay detects only the prothrombin G20210A mutation and does not measure genetic abnormalities elsewhere in the genome. Other thrombotic risk factors may be pursued through systematic clinical laboratory analysis. These factors include the R506Q (Leiden) mutation in the Factor V gene, plasma  homocysteine levels, as well as testing for deficiencies of antithrombin III, protein C and protein S. Genetic Counselors are available for health care providers to discuss results at [phone] [phone]). Methodology: DNA analysis of the Factor II gene was performed by PCR amplification followed by restriction analysis. The di                          agnostic sensitivity is >99% for both. All the tests must be combined with clinical information for the most accurate interpretation. Molecular-based testing is highly accurate, but as in any laboratory test, diagnostic errors may occur. This test was developed and its performance characteristics determined by [facility]. It has not been cleared or approved by the Food and Drug Administration. Poort SR, et al. Blood. 1996; 96:0454-0981. Varga EA. Circulation. 2004; 110:e15-e18. Marland Kitchen, et al. Arterioscler Thromb Vasc Biol. [phone]. Vincenza Hews, PhD, [facility] Ernestene Mention, PhD, [facility] Wyatt Portela, M.S., PhD, [facility] Manya Silvas, PhD, [facility] Bonnielee Haff, PhD, [facility] Alpha Gula, PhD, [facility] Performed At: [facility] [address] [place], [place] [id number] Oley Balm MD [id number] Performed at [facility], [address], [place], [place] [address]   . Recommendations-F5LEID: 10/27/2017 Comment   Final   Comment: (NOTE) Result:  Negative (no mutation found) Factor V Leiden is a specific mutation (R506Q) in the factor V gene that is associated with an increased risk of venous thrombosis. Factor V Leiden is more resistant to inactivation by activated protein C.  As a result, factor V persists in the circulation leading to a mild hyper- coagulable state.  The Leiden mutation accounts for 90% - 95% of APC resistance.  Factor V Leiden has been reported in patients with deep vein thrombosis, pulmonary embolus, central retinal vein occlusion, cerebral sinus thrombosis and  hepatic vein thrombosis. Other risk factors to be considered in the workup for venous thrombosis include the G20210A mutation in the factor II (prothrombin) gene, protein S and C deficiency, and antithrombin deficiencies. Anticardiolipin antibody and lupus anticoagulant analysis may be appropriate for certain patients, as well as homocysteine levels. Contact your local [facility] for information on how to order additi                          onal testing if desired. **Genetic counselors are available for health care providers to**  discuss results at [phone] [phone]). Methodology: DNA analysis of the Factor V gene was performed by allele-specific PCR. The diagnostic sensitivity and specificity is >99% for both. Molecular-based testing is highly accurate, but as in any laboratory test, diagnostic errors may occur. All test results  must be combined with clinical information for the most accurate interpretation. This test was developed and its performance characteristics determined by [facility]. It has not been cleared or approved by the Food and Drug Administration. References: Voelkerding K (1996).  Clin Lab Med [phone]. Vincenza Hews, PhD, [facility] Ernestene Mention, PhD, [facility] Wyatt Portela, M.S., PhD, [facility] Manya Silvas, PhD, [facility] Bonnielee Haff, PhD, [facility] Alpha Gula PhD, [facility] Performed At: [facility] [address] [place], [place] [id number] Oley Balm MD YN:[id number]                          7 Performed at [facility], [address], [place], [place] [address]   . WBC 10/27/2017 8.9  3.8 - 10.6 K/uL Final  . RBC 10/27/2017 5.27  4.40 - 5.90 MIL/uL Final  . Hemoglobin 10/27/2017 16.3  13.0 - 18.0 g/dL Final  . HCT 46/96/2952 46.2  40.0 - 52.0 % Final  . MCV 10/27/2017 87.7  80.0 - 100.0 fL Final  . MCH 10/27/2017 30.9  26.0 - 34.0 pg Final  . MCHC 10/27/2017 35.2  32.0 - 36.0 g/dL Final  . RDW 84/13/2440 13.3  11.5 - 14.5 %  Final  . Platelets 10/27/2017 263  150 - 440 K/uL Final  . Neutrophils Relative % 10/27/2017 65  % Final  . Neutro Abs 10/27/2017 5.8  1.4 - 6.5 K/uL Final  . Lymphocytes Relative 10/27/2017 20  % Final  . Lymphs Abs 10/27/2017 1.7  1.0 - 3.6 K/uL Final  . Monocytes Relative 10/27/2017 8  % Final  . Monocytes Absolute 10/27/2017 0.7  0.2 - 1.0 K/uL Final  . Eosinophils Relative 10/27/2017 6  % Final  . Eosinophils Absolute 10/27/2017 0.5  0 - 0.7 K/uL Final  . Basophils Relative 10/27/2017 1  % Final  . Basophils Absolute 10/27/2017 0.1  0 - 0.1 K/uL Final   Performed at [facility], [address] [place], [place] [address]    Assessment:  Tyrone Pautsch is a 82 y.o. male with small pulmonary embolism (chronic) discovered on evaluation of shortness of breath.  D-dimer was 666.03 (0-499 on 10/26/2017.  Chest CT angiogram on 10/26/2017 revealed a 3 mm focus of what was felt to represent chronic nonobstructing thrombus/embolus in a subsegmental branch of the right pulmonary artery.  There was no other evidence suggesting thrombus or embolus in the pulmonary arterial system. Significance of this finding clinically was uncertain given its small size and likely chronic nature.  There was no thoracic aortic aneurysm or dissection. There was a 4 mm nodular opacity inferior lingula.  There was no thoracic adenopathy.  There was mild generalized esophageal wall thickening, a finding likely representing chronic reflux esophagitis.  There was a subcentimeter nodular opacity in the left lobe of the thyroid.  Left lower extremity duplex on 10/26/2017 revealed no evidence of DVT.  Work-up on 10/27/2017 revealed a hematocrit of 46.6, hemoglobin 16.3, and platelets 263,000.  Negative studies included: Factor V Leiden, prothrombin gene mutation, lupus anticoagulant panel, beta-2 glycoprotein, anticardiolipin antibodies.  Factor V Leiden is pending.  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  He has a 24-28 pack year smoking history.  Symptomatically, he has shortness of breath.  He has a chronic cough.  Exam reveals mild LLE edema.  Plan: 1. Review work-up.  No evidence of a myeloproliferative disorder or concerning hypercoagulable state.  Prothrombin gene mutation and lupus anticoagulant testing was negative.  Patient will be contacted about pending Factor V Leiden testing. 2.   Discuss follow-up of 4 mm lingular nodule with imaging in 6 months. 3.   Discuss follow-up esophageal wall thickening with EGD.  4.   Discuss need for life long anticoagulation if thrombosis in future detected. 5.   RTC prn.  Addendum:  Patient contacted regarded negative Factor V Leiden (returned after clinic).   Nelva Nay, MD 11/03/2017, 4:35 PM

## 2017-11-04 ENCOUNTER — Telehealth: Payer: Self-pay | Admitting: *Deleted

## 2017-11-04 NOTE — Telephone Encounter (Signed)
-----   Message from Rosey Bath, MD sent at 11/04/2017  6:56 AM EDT ----- Regarding: Please call patient with Factor V Leiden result  Negative.  M  ----- Message ----- From: Verlee Monte, NP Sent: 11/03/2017   6:42 PM To: Rosey Bath, MD  MAGIC!!!!  ----- Message ----- From: Leory Plowman, Lab In [place] Sent: 10/27/2017   4:34 PM To: Verlee Monte, NP

## 2017-11-04 NOTE — Telephone Encounter (Signed)
Called patient to let him know his Factor V Leiden is negative.

## 2017-11-05 ENCOUNTER — Encounter: Payer: Self-pay | Admitting: Oncology

## 2018-05-17 ENCOUNTER — Other Ambulatory Visit: Payer: Self-pay | Admitting: Specialist

## 2018-05-17 DIAGNOSIS — R0602 Shortness of breath: Secondary | ICD-10-CM

## 2018-05-17 DIAGNOSIS — R053 Chronic cough: Secondary | ICD-10-CM

## 2018-05-17 DIAGNOSIS — R05 Cough: Secondary | ICD-10-CM

## 2018-06-14 ENCOUNTER — Ambulatory Visit: Payer: Medicare PPO | Attending: Specialist

## 2018-06-14 DIAGNOSIS — R05 Cough: Secondary | ICD-10-CM | POA: Diagnosis present

## 2018-06-14 DIAGNOSIS — R0602 Shortness of breath: Secondary | ICD-10-CM | POA: Insufficient documentation

## 2018-06-14 DIAGNOSIS — R053 Chronic cough: Secondary | ICD-10-CM

## 2018-06-14 MED ORDER — METHACHOLINE 0.0625 MG/ML NEB SOLN
2.0000 mL | Freq: Once | RESPIRATORY_TRACT | Status: AC
  Administered 2018-06-14: 0.125 mg via RESPIRATORY_TRACT
  Filled 2018-06-14: qty 2

## 2018-06-14 MED ORDER — SODIUM CHLORIDE 0.9 % IN NEBU
3.0000 mL | INHALATION_SOLUTION | Freq: Once | RESPIRATORY_TRACT | Status: AC
  Administered 2018-06-14: 3 mL via RESPIRATORY_TRACT

## 2018-06-14 MED ORDER — METHACHOLINE 16 MG/ML NEB SOLN
2.0000 mL | Freq: Once | RESPIRATORY_TRACT | Status: AC
  Administered 2018-06-14: 32 mg via RESPIRATORY_TRACT
  Filled 2018-06-14: qty 2

## 2018-06-14 MED ORDER — METHACHOLINE 4 MG/ML NEB SOLN
2.0000 mL | Freq: Once | RESPIRATORY_TRACT | Status: AC
  Administered 2018-06-14: 8 mg via RESPIRATORY_TRACT
  Filled 2018-06-14: qty 2

## 2018-06-14 MED ORDER — ALBUTEROL SULFATE (2.5 MG/3ML) 0.083% IN NEBU
2.5000 mg | INHALATION_SOLUTION | Freq: Once | RESPIRATORY_TRACT | Status: AC
  Administered 2018-06-14: 2.5 mg via RESPIRATORY_TRACT
  Filled 2018-06-14: qty 3

## 2018-06-14 MED ORDER — METHACHOLINE 0.25 MG/ML NEB SOLN
2.0000 mL | Freq: Once | RESPIRATORY_TRACT | Status: AC
  Administered 2018-06-14: 0.5 mg via RESPIRATORY_TRACT
  Filled 2018-06-14: qty 2

## 2018-06-14 MED ORDER — METHACHOLINE 1 MG/ML NEB SOLN
2.0000 mL | Freq: Once | RESPIRATORY_TRACT | Status: AC
  Administered 2018-06-14: 2 mg via RESPIRATORY_TRACT
  Filled 2018-06-14: qty 2

## 2018-07-05 ENCOUNTER — Other Ambulatory Visit: Payer: Self-pay | Admitting: Orthopedic Surgery

## 2018-07-05 ENCOUNTER — Other Ambulatory Visit (HOSPITAL_COMMUNITY): Payer: Self-pay | Admitting: Orthopedic Surgery

## 2018-07-05 DIAGNOSIS — M5442 Lumbago with sciatica, left side: Principal | ICD-10-CM

## 2018-07-05 DIAGNOSIS — M5441 Lumbago with sciatica, right side: Secondary | ICD-10-CM

## 2018-07-05 DIAGNOSIS — M4807 Spinal stenosis, lumbosacral region: Secondary | ICD-10-CM

## 2018-07-12 ENCOUNTER — Ambulatory Visit (HOSPITAL_COMMUNITY)
Admission: RE | Admit: 2018-07-12 | Discharge: 2018-07-12 | Disposition: A | Discharge status: Home / Self Care | Payer: Medicare PPO | Source: Ambulatory Visit | Attending: Orthopedic Surgery | Admitting: Orthopedic Surgery

## 2018-07-12 DIAGNOSIS — M5441 Lumbago with sciatica, right side: Secondary | ICD-10-CM | POA: Diagnosis present

## 2018-07-12 DIAGNOSIS — M4807 Spinal stenosis, lumbosacral region: Secondary | ICD-10-CM | POA: Insufficient documentation

## 2018-07-12 DIAGNOSIS — M5442 Lumbago with sciatica, left side: Secondary | ICD-10-CM | POA: Insufficient documentation

## 2018-11-21 ENCOUNTER — Other Ambulatory Visit: Payer: Self-pay | Admitting: Specialist

## 2018-11-21 ENCOUNTER — Other Ambulatory Visit (HOSPITAL_COMMUNITY): Payer: Self-pay | Admitting: Specialist

## 2018-11-21 DIAGNOSIS — R0602 Shortness of breath: Secondary | ICD-10-CM

## 2018-11-21 DIAGNOSIS — R05 Cough: Secondary | ICD-10-CM

## 2018-11-21 DIAGNOSIS — R053 Chronic cough: Secondary | ICD-10-CM

## 2018-11-28 ENCOUNTER — Other Ambulatory Visit: Payer: Self-pay

## 2018-11-28 ENCOUNTER — Ambulatory Visit (HOSPITAL_COMMUNITY)
Admission: RE | Admit: 2018-11-28 | Discharge: 2018-11-28 | Disposition: A | Discharge status: Home / Self Care | Payer: Medicare PPO | Source: Ambulatory Visit | Attending: Specialist | Admitting: Specialist

## 2018-11-28 DIAGNOSIS — R0602 Shortness of breath: Secondary | ICD-10-CM | POA: Insufficient documentation

## 2018-11-28 DIAGNOSIS — R05 Cough: Secondary | ICD-10-CM | POA: Diagnosis present

## 2018-11-28 DIAGNOSIS — R053 Chronic cough: Secondary | ICD-10-CM

## 2019-03-28 ENCOUNTER — Other Ambulatory Visit: Payer: Self-pay | Admitting: Internal Medicine

## 2019-03-28 DIAGNOSIS — R270 Ataxia, unspecified: Secondary | ICD-10-CM

## 2019-03-29 ENCOUNTER — Ambulatory Visit (HOSPITAL_COMMUNITY)
Admission: RE | Admit: 2019-03-29 | Discharge: 2019-03-29 | Disposition: A | Discharge status: Home / Self Care | Payer: Medicare PPO | Source: Ambulatory Visit | Attending: Internal Medicine | Admitting: Internal Medicine

## 2019-03-29 ENCOUNTER — Other Ambulatory Visit: Payer: Self-pay

## 2019-03-29 ENCOUNTER — Other Ambulatory Visit: Payer: Self-pay | Admitting: Internal Medicine

## 2019-03-29 ENCOUNTER — Other Ambulatory Visit (HOSPITAL_COMMUNITY): Payer: Self-pay | Admitting: Internal Medicine

## 2019-03-29 DIAGNOSIS — R270 Ataxia, unspecified: Secondary | ICD-10-CM | POA: Insufficient documentation

## 2019-03-30 ENCOUNTER — Encounter (HOSPITAL_COMMUNITY): Payer: Self-pay

## 2019-03-30 ENCOUNTER — Emergency Department (HOSPITAL_COMMUNITY)
Admission: EM | Admit: 2019-03-30 | Discharge: 2019-03-30 | Disposition: A | Discharge status: Home / Self Care | Payer: Medicare PPO | Attending: Emergency Medicine | Admitting: Emergency Medicine

## 2019-03-30 ENCOUNTER — Emergency Department (HOSPITAL_COMMUNITY): Payer: Medicare PPO

## 2019-03-30 DIAGNOSIS — Z7982 Long term (current) use of aspirin: Secondary | ICD-10-CM | POA: Insufficient documentation

## 2019-03-30 DIAGNOSIS — Z96642 Presence of left artificial hip joint: Secondary | ICD-10-CM | POA: Insufficient documentation

## 2019-03-30 DIAGNOSIS — R42 Dizziness and giddiness: Secondary | ICD-10-CM | POA: Insufficient documentation

## 2019-03-30 DIAGNOSIS — W19XXXD Unspecified fall, subsequent encounter: Secondary | ICD-10-CM

## 2019-03-30 DIAGNOSIS — Z79899 Other long term (current) drug therapy: Secondary | ICD-10-CM | POA: Diagnosis not present

## 2019-03-30 DIAGNOSIS — M25512 Pain in left shoulder: Secondary | ICD-10-CM | POA: Insufficient documentation

## 2019-03-30 DIAGNOSIS — W010XXD Fall on same level from slipping, tripping and stumbling without subsequent striking against object, subsequent encounter: Secondary | ICD-10-CM | POA: Insufficient documentation

## 2019-03-30 DIAGNOSIS — Z96653 Presence of artificial knee joint, bilateral: Secondary | ICD-10-CM | POA: Diagnosis not present

## 2019-03-30 DIAGNOSIS — Z87891 Personal history of nicotine dependence: Secondary | ICD-10-CM | POA: Diagnosis not present

## 2019-03-30 DIAGNOSIS — M25511 Pain in right shoulder: Secondary | ICD-10-CM | POA: Diagnosis not present

## 2019-03-30 LAB — CBC WITH DIFFERENTIAL/PLATELET
Abs Immature Granulocytes: 0.04 10*3/uL (ref 0.00–0.07)
Basophils Absolute: 0.1 10*3/uL (ref 0.0–0.1)
Basophils Relative: 1 %
Eosinophils Absolute: 0.1 10*3/uL (ref 0.0–0.5)
Eosinophils Relative: 1 %
HCT: 46.6 % (ref 39.0–52.0)
Hemoglobin: 16.2 g/dL (ref 13.0–17.0)
Immature Granulocytes: 0 %
Lymphocytes Relative: 12 %
Lymphs Abs: 1.3 10*3/uL (ref 0.7–4.0)
MCH: 30.5 pg (ref 26.0–34.0)
MCHC: 34.8 g/dL (ref 30.0–36.0)
MCV: 87.6 fL (ref 80.0–100.0)
Monocytes Absolute: 0.7 10*3/uL (ref 0.1–1.0)
Monocytes Relative: 7 %
Neutro Abs: 8.8 10*3/uL — ABNORMAL HIGH (ref 1.7–7.7)
Neutrophils Relative %: 79 %
Platelets: 243 10*3/uL (ref 150–400)
RBC: 5.32 MIL/uL (ref 4.22–5.81)
RDW: 12.5 % (ref 11.5–15.5)
WBC: 11.1 10*3/uL — ABNORMAL HIGH (ref 4.0–10.5)
nRBC: 0 % (ref 0.0–0.2)

## 2019-03-30 LAB — URINALYSIS, ROUTINE W REFLEX MICROSCOPIC
Bilirubin Urine: NEGATIVE
Glucose, UA: 50 mg/dL — AB
Hgb urine dipstick: NEGATIVE
Ketones, ur: NEGATIVE mg/dL
Leukocytes,Ua: NEGATIVE
Nitrite: NEGATIVE
Protein, ur: NEGATIVE mg/dL
Specific Gravity, Urine: 1.02 (ref 1.005–1.030)
pH: 5 (ref 5.0–8.0)

## 2019-03-30 LAB — TSH: TSH: 1.081 u[IU]/mL (ref 0.350–4.500)

## 2019-03-30 LAB — SEDIMENTATION RATE: Sed Rate: 8 mm/hr (ref 0–16)

## 2019-03-30 LAB — C-REACTIVE PROTEIN: CRP: <0.8 mg/dL (ref <1.0)

## 2019-03-30 NOTE — ED Provider Notes (Addendum)
[facility] EMERGENCY DEPARTMENT Provider Note   CSN: [id number] Arrival date & time: 03/30/19  9407  History    Chief Complaint  Patient presents with   Fall    HPI Donald Conley is a 83 y.o. male with PMHx s/f Bilateral total knee, left total hip, hypertension, generalized OA, BPH, controlled type 2 diabetes, PVD, history of PE, who presents to the ED with recent fall with pain to his bilateral shoulders, right worse than left.  Patient believes that his falls are likely more due to his left replaced hip. Pt saw Dr. Ouida Sills on 03/28/19 and took DG of left hip (no report on available), but patient's daughter reports that they said it had "fallen apart".  Patient denies any dizziness at the time of falls.  He reports that he does not know what happens he just falls over.  This morning, he went up to go to the bathroom and tried to catch himself on his dresser with his left arm and ultimately fell on his right arm.  He reported that he did not have any shoulder pain prior to the fall.  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  The pain that he is experiencing today is an acute pain.  Allergies: Patient has no known allergies. Medications: Current Outpatient Medications:    acetaminophen (TYLENOL) 650 MG CR tablet, Take 650 mg by mouth as needed., Disp: , Rfl:    Ascorbic Acid (VITAMIN C) 1000 MG tablet, Take 1,000 mg by mouth daily., Disp: , Rfl:    aspirin 81 MG chewable tablet, Chew 81 mg by mouth daily., Disp: , Rfl:    Calcium Carb-Cholecalciferol (CALCIUM 1000 + D PO), Take 1 tablet by mouth daily., Disp: , Rfl:    Cholecalciferol (VITAMIN D-1000 MAX ST) 1000 units tablet, Take 1,000 Units by mouth daily., Disp: , Rfl:    docusate sodium (COLACE) 100 MG capsule, Take 100 mg by mouth daily., Disp: , Rfl:    doxazosin (CARDURA) 8 MG tablet, Take 8 mg by mouth daily., Disp: ,  Rfl:    fexofenadine (ALLEGRA) 180 MG tablet, Take 180 mg by mouth daily., Disp: , Rfl:    finasteride (PROSCAR) 5 MG tablet, Take 5 mg by mouth daily., Disp: , Rfl:    Omega-3 Fatty Acids (KP FISH OIL) 1200 MG CAPS, Take 1,200 mg by mouth daily., Disp: , Rfl:    omeprazole (PRILOSEC) 20 MG capsule, Take 20 mg by mouth daily., Disp: , Rfl:    simvastatin (ZOCOR) 20 MG tablet, Take 20 mg by mouth daily., Disp: , Rfl:    Past Medical/Surgical History Past Medical History:  Diagnosis Date   Allergy    Arthritis    Cataract    GERD (gastroesophageal reflux disease)     Patient Active Problem List   Diagnosis Date Noted   Chronic pulmonary embolism without acute cor pulmonale ([place]) 11/03/2017    Past Surgical History:  Procedure Laterality Date   REPLACEMENT TOTAL KNEE BILATERAL     TOTAL HIP ARTHROPLASTY Left    WRIST ARTHROPLASTY Right     Family History  Problem Relation Age of Onset   Cancer Father    Social History   Tobacco Use   Smoking status: Former Smoker    Packs/day: 4.00    Years: 6.00    Pack years: 24.00   Smokeless tobacco: Never Used  Substance  Use Topics   Alcohol use: Not on file   Drug use: Not on file   Review of Systems Review of Systems  Constitutional: Negative for chills and fever.  HENT: Negative for ear pain and sore throat.   Eyes: Negative for pain and visual disturbance.  Respiratory: Negative for cough and shortness of breath.   Cardiovascular: Negative for chest pain and palpitations.  Gastrointestinal: Negative for abdominal pain and vomiting.  Genitourinary: Negative for dysuria and hematuria.  Musculoskeletal: Negative for arthralgias and back pain.  Skin: Negative for color change and rash.  Neurological: Negative for dizziness, seizures, syncope, light-headedness and numbness.  All other systems reviewed and are negative.   Physical Exam Updated Vital Signs BP (!) 134/96 (BP Location: Right Arm)    Pulse 86     Temp 97.6 F (36.4 C) (Oral)    Resp 12    Ht 5' 6" (1.676 m)    Wt 95.7 kg    SpO2 97%    BMI 34.06 kg/m   Physical Exam Vitals signs and nursing note reviewed.  Constitutional:      Appearance: He is well-developed.  HENT:     Head: Normocephalic and atraumatic.  Eyes:     Conjunctiva/sclera: Conjunctivae normal.  Neck:     Musculoskeletal: Neck supple.  Cardiovascular:     Rate and Rhythm: Normal rate and regular rhythm.     Heart sounds: No murmur.  Pulmonary:     Effort: Pulmonary effort is normal. No respiratory distress.     Breath sounds: Normal breath sounds.  Abdominal:     Palpations: Abdomen is soft.     Tenderness: There is no abdominal tenderness.  Musculoskeletal:     Right shoulder: He exhibits bony tenderness. He exhibits normal range of motion and normal strength.     Left shoulder: He exhibits normal range of motion, no bony tenderness, no effusion, no deformity and normal strength.  Skin:    General: Skin is warm and dry.  Neurological:     Mental Status: He is alert.     ED Treatments / Results  Labs (all labs ordered are listed, but only abnormal results are displayed) Labs Reviewed  CBC WITH DIFFERENTIAL/PLATELET - Abnormal; Notable for the following components:      Result Value   WBC 11.1 (*)    Neutro Abs 8.8 (*)    All other components within normal limits  SEDIMENTATION RATE  C-REACTIVE PROTEIN  TSH  URINALYSIS, ROUTINE W REFLEX MICROSCOPIC    EKG None  Radiology Dg Shoulder Right  Result Date: 03/30/2019 CLINICAL DATA:  Fall, bilateral shoulder pain right worse than left. EXAM: RIGHT SHOULDER - 2+ VIEW COMPARISON:  Contralateral shoulder. FINDINGS: Mild glenohumeral degenerative changes without signs of acute fracture or dislocation. Incidentally imaged portions of the upper chest are unremarkable. IMPRESSION: Negative. Electronically Signed   By: Zetta Bills M.D.   On: 03/30/2019 07:58   Ct Head Wo Contrast  Result Date:  03/29/2019 CLINICAL DATA:  Dizziness, ataxia, fell 1 week ago after getting dizzy, denies head injury and loss of consciousness, former smoker EXAM: CT HEAD WITHOUT CONTRAST TECHNIQUE: Contiguous axial images were obtained from the base of the skull through the vertex without intravenous contrast. Sagittal and coronal MPR images reconstructed from axial data set. COMPARISON:  None FINDINGS: Brain: Generalized atrophy. Normal ventricular morphology. No midline shift or mass effect. Small vessel chronic ischemic changes of deep cerebral white matter. Small LEFT occipital infarct, likely subacute to  old. No intracranial hemorrhage, mass lesion, evidence of acute infarction, or extra-axial fluid collection. Vascular: Atherosclerotic calcifications of internal carotid and vertebral arteries at skull base Skull: Intact Sinuses/Orbits: Mucosal thickening and small amount of mucous within the sphenoid sinus. Partial opacification of posterior RIGHT ethmoid air cells. Other: N/A IMPRESSION: Atrophy with small vessel chronic ischemic changes of deep cerebral white matter. 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 No acute intracranial abnormalities. Electronically Signed   By: Lavonia Dana M.D.   On: 03/29/2019 17:08   Mr Brain Wo Contrast  Result Date: 03/30/2019 CLINICAL DATA:  Worsening falls over the last several weeks. Dizziness and ataxia. Abnormal head CT yesterday. EXAM: MRI HEAD WITHOUT CONTRAST TECHNIQUE: Multiplanar, multiecho pulse sequences of the brain and surrounding structures were obtained without intravenous contrast. COMPARISON:  Head CT 03/29/2019 FINDINGS: Brain: Diffusion imaging does not show any acute or subacute infarction. Chronic small-vessel ischemic changes affect the pons. There are old small vessel cerebellar infarctions. There is an old left occipital cortical and subcortical infarction. There chronic small-vessel changes of the thalami and basal ganglia. Extensive chronic small-vessel  ischemic changes affect the cerebral hemispheric white matter. No cortical or large vessel territory infarction otherwise. No mass lesion, hemorrhage, hydrocephalus or extra-axial collection. Vascular: Major vessels at the base of the brain show flow. Skull and upper cervical spine: Negative Sinuses/Orbits: Clear/normal Other: None IMPRESSION: No acute, subacute or reversible finding. Extensive chronic small-vessel ischemic changes throughout the brain as outlined above. 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 Electronically Signed   By: Nelson Chimes M.D.   On: 03/30/2019 10:51   Dg Shoulder Left  Result Date: 03/30/2019 CLINICAL DATA:  Bilateral shoulder pain., status post fall. EXAM: LEFT SHOULDER - 2+ VIEW COMPARISON:  Contralateral shoulder. FINDINGS: Moderate left glenohumeral degenerative changes. No signs of acute fracture or subluxation. Incidentally imaged portions of the left upper chest are unremarkable. IMPRESSION: Moderate degenerative changes in the left glenohumeral joint. No acute abnormality. Electronically Signed   By: Zetta Bills M.D.   On: 03/30/2019 08:00   Procedures Procedures (including critical care time)  Medications Ordered in ED Medications - No data to display  Initial Impression / Assessment and Plan / ED Course  I have reviewed the triage vital signs and the nursing notes. Pertinent labs & imaging results that were available during my care of the patient were reviewed by me and considered in my medical decision making (see chart for details). Patient presenting with multiple falls over the last several weeks and presents to make sure he does not have any acute injuries to his bilateral shoulders and arm pain after fall this morning.  Patient's family has been to their PCP, Dr. Ouida Sills who obtained radiographs of his left hip a few days ago.  I can seen the x-ray was done in care everywhere but there are no results available for review.  Patient was  told to follow-up with orthopedist following that visit, however, patient and daughter report that they have not received any call recently.  I have called over to the clinic to ask them to fax over any results from 03/28/2019.  Additionally, spoke to Dr. Theodore Demark nurse on the phone.  Dr. Rudene Christians does not have any room in her schedule to see the patient in the next couple weeks, but I have asked to talk to the [facility] for a consult on what to do in this patient's case.  Patient is otherwise stable at this time.  He has  full range of motion in his upper extremities, however does have some pain on the right side with shoulder flexion and abduction.  His shoulder x-rays both came back within normal limits without any acute findings. Patient denies any dizziness or side effects from blood pressure medications. He does not take his blood pressure at home, but it is always normal in the office. He denies any new medications, LOC, syncope. Patient will likely be discharged home as there are no acute abnormalities with close follow-up with his orthopedic surgeon.  Additionally, have encouraged patient to use walker and assistive walking devices at home when possible.  Patient does have a history of pulmonary embolism but is only on antiplatelet (Plavix, just changed from ASA this morning by his PCP).   Spoke to PCP office who sent her over radiograph diagnostic report showing mild loosening of the upper portion of the left femoral prosthesis cannot be excluded.  Otherwise no acute bony or joint abnormalities.  Patient does have peripheral vascular disease.   Spoke to orthopedic PA Gerald Stabs from Dr. Theodore Demark office who is going to try to fit the patient into be seen tomorrow.  They recommend that patient use a walker at home.  They would like [place] to obtain an ESR, CRP, CBC to rule out any infection at this time.  Patient can continue his Plavix. No other recommendations at this time.   Obtain MRI to rule out small infarct.   MRI came back normal with no acute findings.  Additionally, will obtain UA, TSH to rule out other major causes of falls. CT head yesterday was negative for any acute findings.    final Clinical Impressions(s) / ED Diagnoses   Final diagnoses:  Fall, subsequent encounter   ED Discharge Orders    None    We will discharge patient home with return precautions and safety precautions.  Patient to follow-up with his PCP with referral for home health PT/OT and home evaluation for safety.   Disposition: home   Wilber Oliphant, M.D. FM PGY-2     Wilber Oliphant, MD 03/30/19 1135    Wilber Oliphant, MD 03/30/19 1221    Elnora Morrison, MD 03/31/19 [phone]

## 2019-03-30 NOTE — ED Triage Notes (Signed)
Pt has been having increasing falls for the last few weeks. Yesterday, pt had a CT of his head. Pt has also had left hip pain. Got an xray of that hip a few weeks in [place] and believes he is falling due to this hip. Complaining right and left shoulder pain. Denies hitting head or losing consciousness.

## 2019-03-30 NOTE — Discharge Instructions (Addendum)
Your imaging came back normal today.  We also took labs, which were stable.  Please go to your orthopedic surgery appointment tomorrow for further follow-up.  Please use your walker and assistive devices at home.  Please refrain from walking by yourself and do ask for assistance when needed.  Check your blood pressures at home once daily to ensure they are not dropping too low.  This could also cause falls.

## 2019-08-01 IMAGING — MR MR LUMBAR SPINE W/O CM
4 of 5 series · 15 of 48 positions shown · non-contrast
Comparison: None.

CLINICAL DATA: Low back pain extending into the lower extremities
bilaterally.

EXAM:
MRI LUMBAR SPINE WITHOUT CONTRAST
TECHNIQUE: Multiplanar, multisequence MR imaging of the lumbar spine was
performed. No intravenous contrast was administered.

[Series 3: T2 · sagittal · 4.0mm · 0.62mm/px · 6 of 17 slices shown (1 of 2)]
[im 1/17]
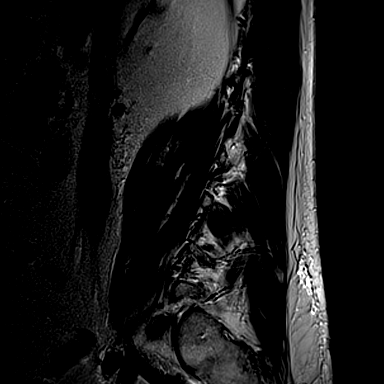
[im 4/17]
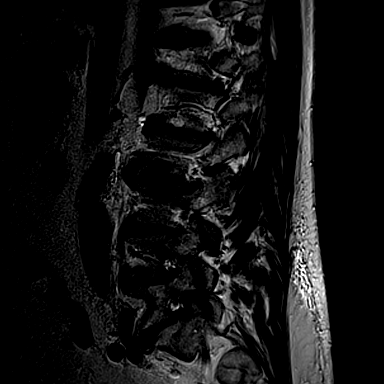
[im 7/17]
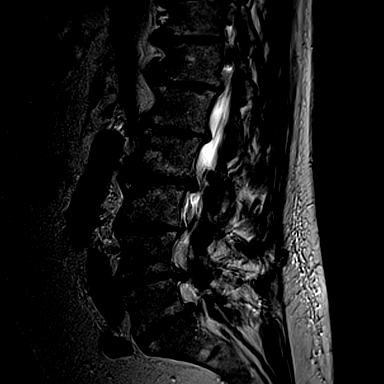
[im 10/17]
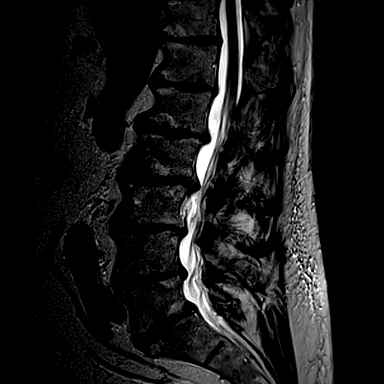
[im 13/17]
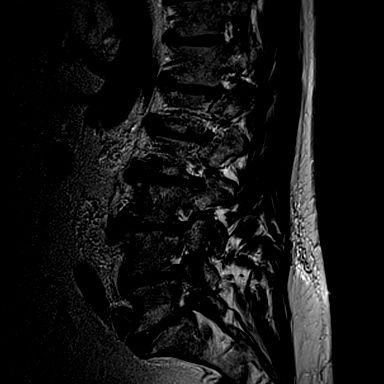
[im 17/17]
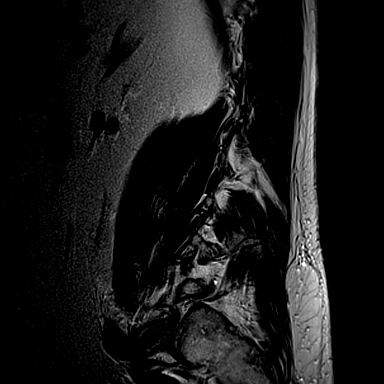

[Series 4: T1 · sagittal · 4.0mm · 0.31mm/px · 3 of 17 slices shown (1 of 2)]
[im 3/17]
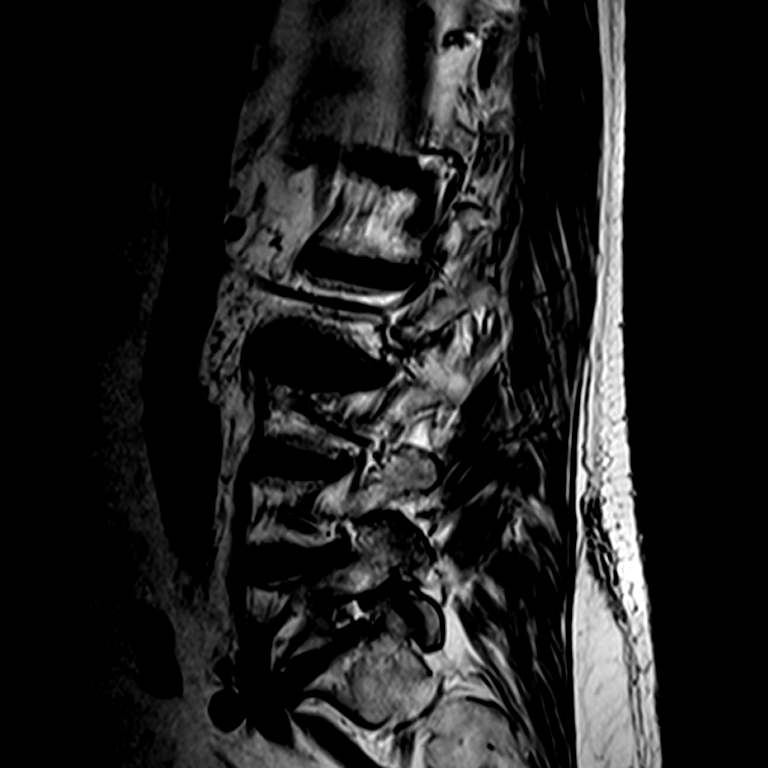
[im 9/17]
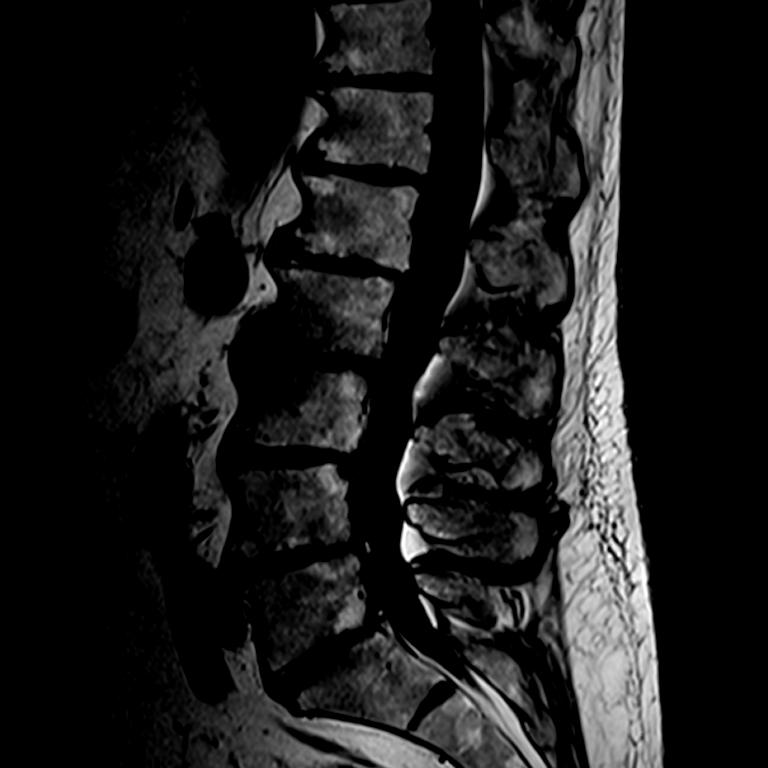
[im 14/17]
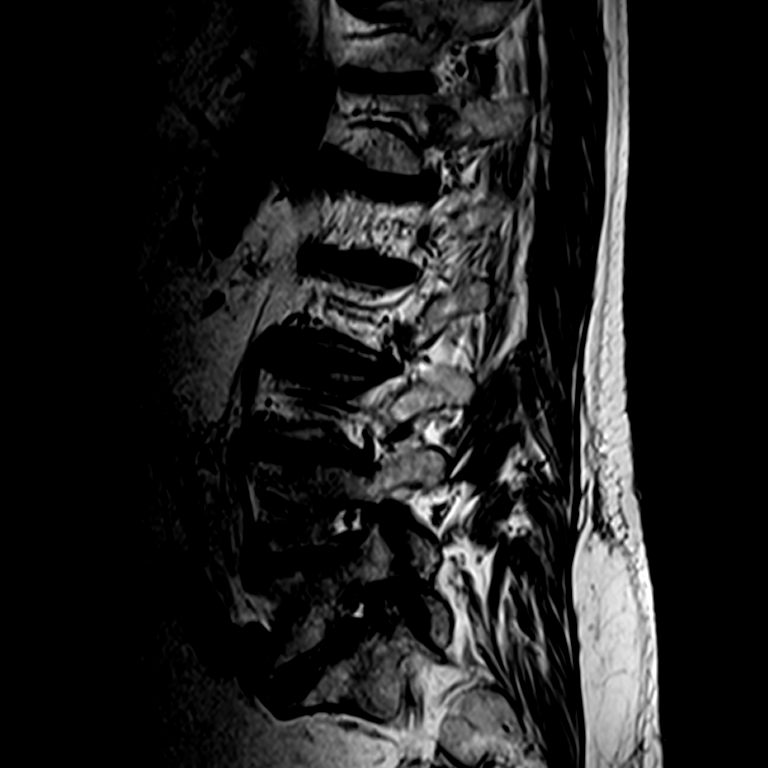

[Series 6: T2 · axial · 4.0mm · 0.24mm/px · z∈[-73,+50]mm · 3 of 37 slices shown (2 of 2)]
[im 6/37]
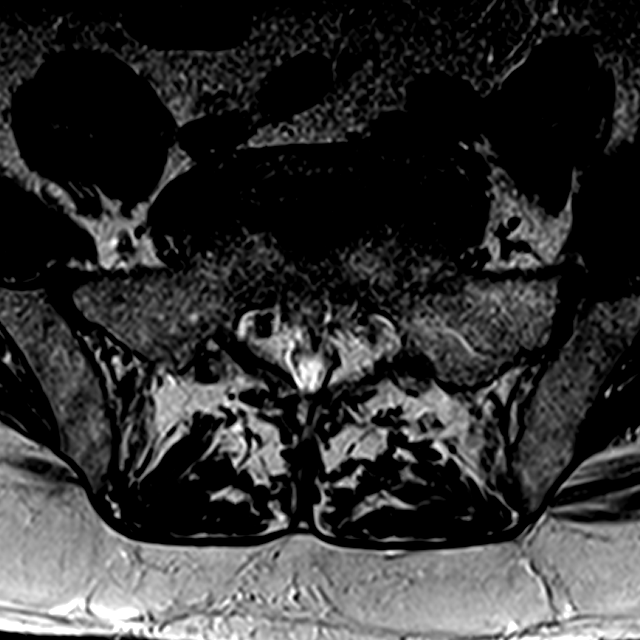
[im 20/37]
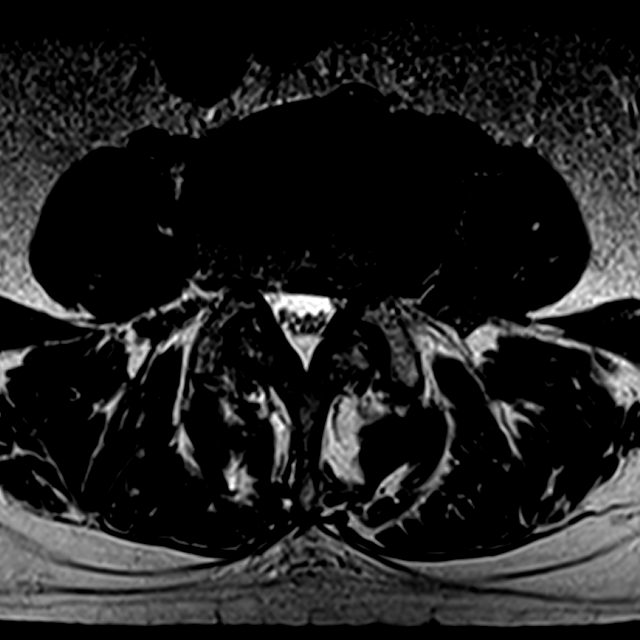
[im 31/37]
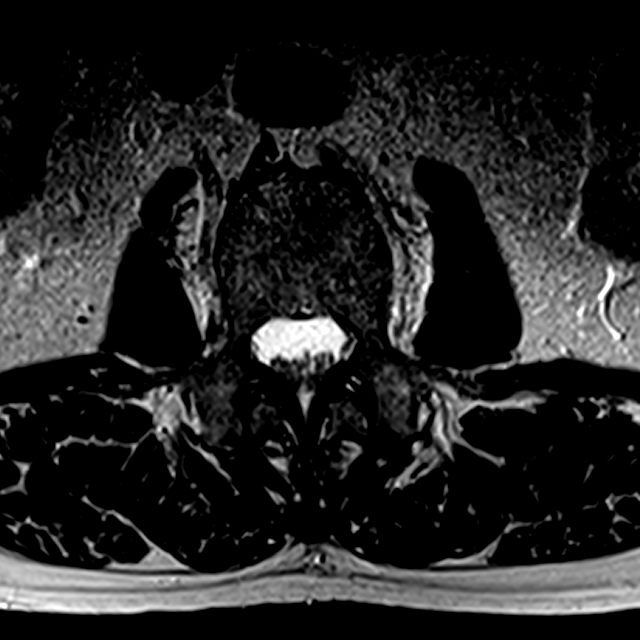

[Series 7: T1 · axial · 4.0mm · 0.24mm/px · z∈[-73,+50]mm · 3 of 37 slices shown (2 of 2)]
[im 6/37]
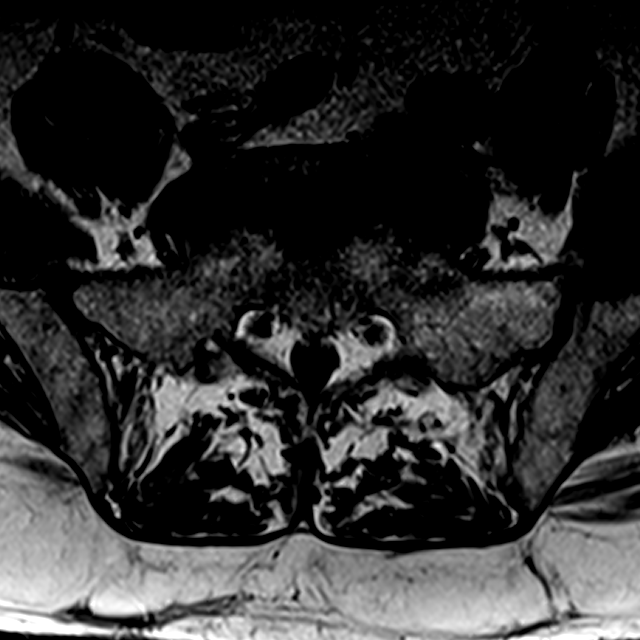
[im 20/37]
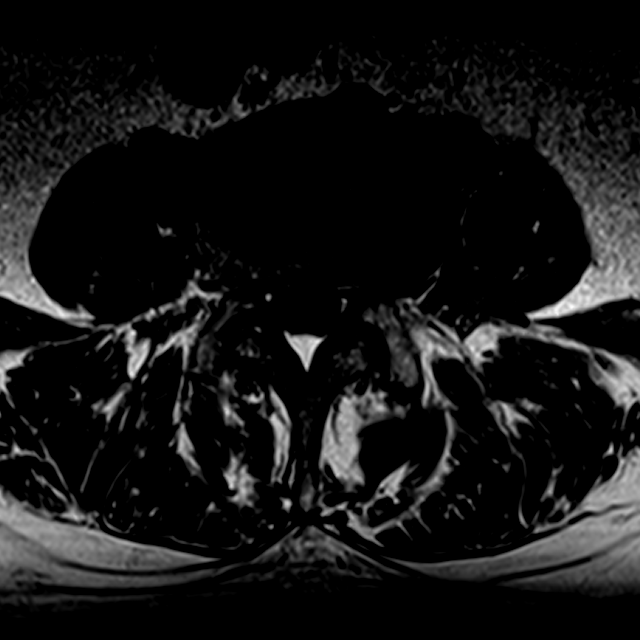
[im 31/37]
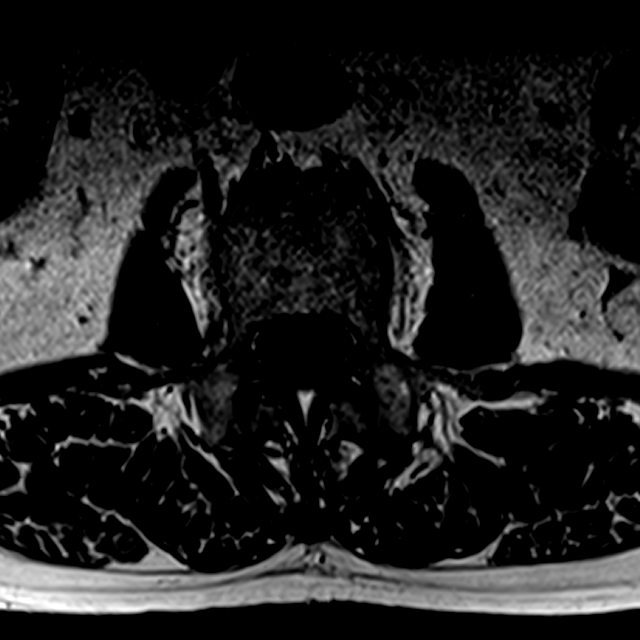

[15 of 48 positions shown; findings below may reference images not displayed]

FINDINGS: Segmentation: 5 non rib-bearing lumbar type vertebral bodies are
present. The lowest fully formed vertebral body is L5.

Alignment: Slight retrolisthesis is present at T12-L1, L1-2, and
L2-3.

Vertebrae: Chronic endplate marrow changes are present posteriorly
at L3-4, L4-5, and L5-S1. Edematous endplate marrow changes are
present anteriorly at L2-3. The hemangioma on the right inferiorly
at L1 measures 13 mm.

Conus medullaris and cauda equina: Conus extends to the L1-2 level.
Conus and cauda equina appear normal.

Paraspinal and other soft tissues: Limited imaging the abdomen is
unremarkable. There is no significant adenopathy. No solid organ
lesions are present.

Disc levels:

L1-2: Mild disc bulging and facet hypertrophy is present
bilaterally. The central canal is patent. Mild bilateral foraminal
narrowing is noted.

L2-3: A broad-based disc protrusion is present. Moderate facet
hypertrophy is noted. This leads to mild subarticular and foraminal
narrowing bilaterally.

L3-4: A broad-based disc protrusion is present. Bilateral facet
hypertrophy is noted. Mild foraminal narrowing is worse on the left.
The central canal is patent.

L4-5: A broad-based disc protrusion is present. Facet hypertrophy is
worse on the right. Subarticular narrowing is worse on the left.
Moderate foraminal stenosis is present bilaterally.

L5-S1: A shallow rightward disc protrusion is present. There is
potential contact of the traversing right S1 nerve roots. Mild
foraminal stenosis is present bilaterally.
IMPRESSION: 1. Multilevel spondylosis of the lumbar spine is greatest at L4-5.
2. Mild subarticular narrowing bilaterally at L4-5 is worse on the
left. Moderate foraminal stenosis is present bilaterally.
3. Rightward disc protrusion at L5-S1 potentially contacts the
traversing right S1 nerve roots.
4. Mild foraminal narrowing bilaterally at L5-S1.
5. Mild foraminal narrowing at L3-4 is worse on the left.
6. Mild central and foraminal narrowing bilaterally at L2-3.
7. Mild bilateral foraminal narrowing at L1-2.

## 2019-09-21 ENCOUNTER — Inpatient Hospital Stay (HOSPITAL_COMMUNITY)
Admission: EM | Admit: 2019-09-21 | Discharge: 2019-09-29 | DRG: 064 | Disposition: A | Discharge status: Hospice | Payer: Medicare PPO | Attending: Internal Medicine | Admitting: Internal Medicine

## 2019-09-21 ENCOUNTER — Emergency Department (HOSPITAL_COMMUNITY): Payer: Medicare PPO

## 2019-09-21 ENCOUNTER — Observation Stay (HOSPITAL_COMMUNITY): Payer: Medicare PPO

## 2019-09-21 ENCOUNTER — Other Ambulatory Visit: Payer: Self-pay

## 2019-09-21 ENCOUNTER — Encounter (HOSPITAL_COMMUNITY): Payer: Self-pay

## 2019-09-21 DIAGNOSIS — R0902 Hypoxemia: Secondary | ICD-10-CM

## 2019-09-21 DIAGNOSIS — I639 Cerebral infarction, unspecified: Secondary | ICD-10-CM

## 2019-09-21 DIAGNOSIS — I10 Essential (primary) hypertension: Secondary | ICD-10-CM | POA: Diagnosis present

## 2019-09-21 DIAGNOSIS — G9341 Metabolic encephalopathy: Secondary | ICD-10-CM | POA: Diagnosis not present

## 2019-09-21 DIAGNOSIS — R2990 Unspecified symptoms and signs involving the nervous system: Secondary | ICD-10-CM | POA: Diagnosis not present

## 2019-09-21 DIAGNOSIS — E538 Deficiency of other specified B group vitamins: Secondary | ICD-10-CM | POA: Diagnosis present

## 2019-09-21 DIAGNOSIS — R29810 Facial weakness: Secondary | ICD-10-CM | POA: Diagnosis present

## 2019-09-21 DIAGNOSIS — R1310 Dysphagia, unspecified: Secondary | ICD-10-CM

## 2019-09-21 DIAGNOSIS — Z79899 Other long term (current) drug therapy: Secondary | ICD-10-CM

## 2019-09-21 DIAGNOSIS — J690 Pneumonitis due to inhalation of food and vomit: Secondary | ICD-10-CM | POA: Diagnosis not present

## 2019-09-21 DIAGNOSIS — K219 Gastro-esophageal reflux disease without esophagitis: Secondary | ICD-10-CM | POA: Diagnosis present

## 2019-09-21 DIAGNOSIS — Z66 Do not resuscitate: Secondary | ICD-10-CM | POA: Diagnosis not present

## 2019-09-21 DIAGNOSIS — K317 Polyp of stomach and duodenum: Secondary | ICD-10-CM | POA: Diagnosis present

## 2019-09-21 DIAGNOSIS — E872 Acidosis: Secondary | ICD-10-CM | POA: Diagnosis not present

## 2019-09-21 DIAGNOSIS — E1151 Type 2 diabetes mellitus with diabetic peripheral angiopathy without gangrene: Secondary | ICD-10-CM | POA: Diagnosis present

## 2019-09-21 DIAGNOSIS — Z7189 Other specified counseling: Secondary | ICD-10-CM

## 2019-09-21 DIAGNOSIS — Z20822 Contact with and (suspected) exposure to covid-19: Secondary | ICD-10-CM | POA: Diagnosis present

## 2019-09-21 DIAGNOSIS — N400 Enlarged prostate without lower urinary tract symptoms: Secondary | ICD-10-CM | POA: Diagnosis present

## 2019-09-21 DIAGNOSIS — R471 Dysarthria and anarthria: Secondary | ICD-10-CM | POA: Diagnosis present

## 2019-09-21 DIAGNOSIS — Z87891 Personal history of nicotine dependence: Secondary | ICD-10-CM

## 2019-09-21 DIAGNOSIS — Z8673 Personal history of transient ischemic attack (TIA), and cerebral infarction without residual deficits: Secondary | ICD-10-CM

## 2019-09-21 DIAGNOSIS — R270 Ataxia, unspecified: Secondary | ICD-10-CM | POA: Diagnosis present

## 2019-09-21 DIAGNOSIS — I071 Rheumatic tricuspid insufficiency: Secondary | ICD-10-CM | POA: Diagnosis present

## 2019-09-21 DIAGNOSIS — J9601 Acute respiratory failure with hypoxia: Secondary | ICD-10-CM | POA: Diagnosis not present

## 2019-09-21 DIAGNOSIS — Z7902 Long term (current) use of antithrombotics/antiplatelets: Secondary | ICD-10-CM

## 2019-09-21 DIAGNOSIS — J310 Chronic rhinitis: Secondary | ICD-10-CM | POA: Diagnosis present

## 2019-09-21 DIAGNOSIS — Z515 Encounter for palliative care: Secondary | ICD-10-CM | POA: Diagnosis not present

## 2019-09-21 DIAGNOSIS — Z96642 Presence of left artificial hip joint: Secondary | ICD-10-CM | POA: Diagnosis present

## 2019-09-21 DIAGNOSIS — H5500 Unspecified nystagmus: Secondary | ICD-10-CM | POA: Diagnosis present

## 2019-09-21 DIAGNOSIS — R4781 Slurred speech: Secondary | ICD-10-CM | POA: Diagnosis present

## 2019-09-21 DIAGNOSIS — R1313 Dysphagia, pharyngeal phase: Secondary | ICD-10-CM | POA: Diagnosis present

## 2019-09-21 DIAGNOSIS — Z96653 Presence of artificial knee joint, bilateral: Secondary | ICD-10-CM | POA: Diagnosis present

## 2019-09-21 DIAGNOSIS — M1990 Unspecified osteoarthritis, unspecified site: Secondary | ICD-10-CM | POA: Diagnosis present

## 2019-09-21 DIAGNOSIS — Z01818 Encounter for other preprocedural examination: Secondary | ICD-10-CM

## 2019-09-21 DIAGNOSIS — E785 Hyperlipidemia, unspecified: Secondary | ICD-10-CM | POA: Diagnosis present

## 2019-09-21 DIAGNOSIS — E869 Volume depletion, unspecified: Secondary | ICD-10-CM | POA: Diagnosis not present

## 2019-09-21 DIAGNOSIS — H269 Unspecified cataract: Secondary | ICD-10-CM | POA: Diagnosis present

## 2019-09-21 DIAGNOSIS — I469 Cardiac arrest, cause unspecified: Secondary | ICD-10-CM | POA: Diagnosis not present

## 2019-09-21 DIAGNOSIS — R29703 NIHSS score 3: Secondary | ICD-10-CM | POA: Diagnosis present

## 2019-09-21 DIAGNOSIS — I9581 Postprocedural hypotension: Secondary | ICD-10-CM | POA: Diagnosis not present

## 2019-09-21 DIAGNOSIS — Z7984 Long term (current) use of oral hypoglycemic drugs: Secondary | ICD-10-CM

## 2019-09-21 DIAGNOSIS — K2970 Gastritis, unspecified, without bleeding: Secondary | ICD-10-CM | POA: Diagnosis present

## 2019-09-21 LAB — COMPREHENSIVE METABOLIC PANEL
ALT: 19 U/L (ref 0–44)
AST: 19 U/L (ref 15–41)
Albumin: 4.1 g/dL (ref 3.5–5.0)
Alkaline Phosphatase: 46 U/L (ref 38–126)
Anion gap: 13 (ref 5–15)
BUN: 11 mg/dL (ref 8–23)
CO2: 26 mmol/L (ref 22–32)
Calcium: 9.2 mg/dL (ref 8.9–10.3)
Chloride: 99 mmol/L (ref 98–111)
Creatinine, Ser: 0.79 mg/dL (ref 0.61–1.24)
GFR calc Af Amer: >60 mL/min (ref >60)
GFR calc non Af Amer: >60 mL/min (ref >60)
Glucose, Bld: 218 mg/dL — ABNORMAL HIGH (ref 70–99)
Potassium: 3.7 mmol/L (ref 3.5–5.1)
Sodium: 138 mmol/L (ref 135–145)
Total Bilirubin: 1.1 mg/dL (ref 0.3–1.2)
Total Protein: 7.4 g/dL (ref 6.5–8.1)

## 2019-09-21 LAB — CBC WITH DIFFERENTIAL/PLATELET
Abs Immature Granulocytes: 0.05 10*3/uL (ref 0.00–0.07)
Basophils Absolute: 0 10*3/uL (ref 0.0–0.1)
Basophils Relative: 0 %
Eosinophils Absolute: 0.1 10*3/uL (ref 0.0–0.5)
Eosinophils Relative: 1 %
HCT: 45.5 % (ref 39.0–52.0)
Hemoglobin: 15.3 g/dL (ref 13.0–17.0)
Immature Granulocytes: 1 %
Lymphocytes Relative: 11 %
Lymphs Abs: 1.1 10*3/uL (ref 0.7–4.0)
MCH: 29.9 pg (ref 26.0–34.0)
MCHC: 33.6 g/dL (ref 30.0–36.0)
MCV: 88.9 fL (ref 80.0–100.0)
Monocytes Absolute: 0.6 10*3/uL (ref 0.1–1.0)
Monocytes Relative: 6 %
Neutro Abs: 8 10*3/uL — ABNORMAL HIGH (ref 1.7–7.7)
Neutrophils Relative %: 81 %
Platelets: 257 10*3/uL (ref 150–400)
RBC: 5.12 MIL/uL (ref 4.22–5.81)
RDW: 12.8 % (ref 11.5–15.5)
WBC: 9.8 10*3/uL (ref 4.0–10.5)
nRBC: 0 % (ref 0.0–0.2)

## 2019-09-21 LAB — GLUCOSE, CAPILLARY: Glucose-Capillary: 148 mg/dL — ABNORMAL HIGH (ref 70–99)

## 2019-09-21 LAB — TSH: TSH: 0.65 u[IU]/mL (ref 0.350–4.500)

## 2019-09-21 LAB — RESPIRATORY PANEL BY RT PCR (FLU A&B, COVID)
Influenza A by PCR: NEGATIVE
Influenza B by PCR: NEGATIVE
SARS Coronavirus 2 by RT PCR: NEGATIVE

## 2019-09-21 LAB — VITAMIN B12: Vitamin B-12: 212 pg/mL (ref 180–914)

## 2019-09-21 LAB — LIPASE, BLOOD: Lipase: 22 U/L (ref 11–51)

## 2019-09-21 MED ORDER — LORATADINE 10 MG PO TABS
10.0000 mg | ORAL_TABLET | Freq: Every day | ORAL | Status: DC
  Filled 2019-09-21 (×4): qty 1

## 2019-09-21 MED ORDER — SODIUM CHLORIDE 0.9 % IV BOLUS
500.0000 mL | Freq: Once | INTRAVENOUS | Status: AC
  Administered 2019-09-21: 500 mL via INTRAVENOUS

## 2019-09-21 MED ORDER — PANTOPRAZOLE SODIUM 40 MG PO TBEC
40.0000 mg | DELAYED_RELEASE_TABLET | Freq: Every day | ORAL | Status: DC
  Filled 2019-09-21: qty 1

## 2019-09-21 MED ORDER — SIMVASTATIN 20 MG PO TABS
20.0000 mg | ORAL_TABLET | Freq: Every day | ORAL | Status: DC
  Filled 2019-09-21 (×4): qty 1

## 2019-09-21 MED ORDER — ACETAMINOPHEN 325 MG PO TABS: 650.0000 mg | ORAL_TABLET | ORAL | Status: DC | PRN

## 2019-09-21 MED ORDER — ACETAMINOPHEN 160 MG/5ML PO SOLN: 650.0000 mg | ORAL | Status: DC | PRN

## 2019-09-21 MED ORDER — DOXAZOSIN MESYLATE 4 MG PO TABS
8.0000 mg | ORAL_TABLET | Freq: Every day | ORAL | Status: DC
  Filled 2019-09-21: qty 1
  Filled 2019-09-21: qty 2
  Filled 2019-09-21 (×2): qty 4
  Filled 2019-09-21 (×2): qty 1
  Filled 2019-09-21: qty 4
  Filled 2019-09-21: qty 2
  Filled 2019-09-21: qty 1
  Filled 2019-09-21: qty 2
  Filled 2019-09-21: qty 1
  Filled 2019-09-21: qty 4
  Filled 2019-09-21: qty 2

## 2019-09-21 MED ORDER — SENNOSIDES-DOCUSATE SODIUM 8.6-50 MG PO TABS
1.0000 | ORAL_TABLET | Freq: Every evening | ORAL | Status: DC | PRN
  Filled 2019-09-21: qty 1

## 2019-09-21 MED ORDER — ASCORBIC ACID 500 MG PO TABS
1000.0000 mg | ORAL_TABLET | Freq: Every day | ORAL | Status: DC
  Filled 2019-09-21 (×4): qty 2

## 2019-09-21 MED ORDER — CLOPIDOGREL BISULFATE 75 MG PO TABS
75.0000 mg | ORAL_TABLET | Freq: Every day | ORAL | Status: DC
  Filled 2019-09-21 (×4): qty 1

## 2019-09-21 MED ORDER — SODIUM CHLORIDE 0.9 % IV SOLN: INTRAVENOUS | Status: DC

## 2019-09-21 MED ORDER — HEPARIN SODIUM (PORCINE) 5000 UNIT/ML IJ SOLN
5000.0000 [IU] | Freq: Three times a day (TID) | INTRAMUSCULAR | Status: DC
  Administered 2019-09-22 – 2019-09-26 (×13): 5000 [IU] via SUBCUTANEOUS
  Filled 2019-09-21 (×12): qty 1

## 2019-09-21 MED ORDER — STROKE: EARLY STAGES OF RECOVERY BOOK
Freq: Once | Status: AC
  Filled 2019-09-21: qty 1

## 2019-09-21 MED ORDER — VITAMIN D 25 MCG (1000 UNIT) PO TABS
1000.0000 [IU] | ORAL_TABLET | Freq: Every day | ORAL | Status: DC
  Filled 2019-09-21 (×4): qty 1

## 2019-09-21 MED ORDER — INSULIN ASPART 100 UNIT/ML ~~LOC~~ SOLN
0.0000 [IU] | Freq: Three times a day (TID) | SUBCUTANEOUS | Status: DC
  Administered 2019-09-22: 1 [IU] via SUBCUTANEOUS
  Administered 2019-09-22: 2 [IU] via SUBCUTANEOUS

## 2019-09-21 MED ORDER — INSULIN ASPART 100 UNIT/ML ~~LOC~~ SOLN: 0.0000 [IU] | Freq: Every day | SUBCUTANEOUS | Status: DC

## 2019-09-21 MED ORDER — ACETAMINOPHEN 650 MG RE SUPP: 650.0000 mg | RECTAL | Status: DC | PRN

## 2019-09-21 MED ORDER — LORAZEPAM 2 MG/ML IJ SOLN
1.0000 mg | Freq: Once | INTRAMUSCULAR | Status: AC | PRN
  Administered 2019-09-21: 1 mg via INTRAVENOUS
  Filled 2019-09-21: qty 1

## 2019-09-21 MED ORDER — FINASTERIDE 5 MG PO TABS
5.0000 mg | ORAL_TABLET | Freq: Every day | ORAL | Status: DC
  Filled 2019-09-21 (×4): qty 1

## 2019-09-21 MED ORDER — FLUTICASONE PROPIONATE 50 MCG/ACT NA SUSP
1.0000 | Freq: Every day | NASAL | Status: DC
  Administered 2019-09-22 – 2019-09-27 (×6): 1 via NASAL
  Filled 2019-09-21: qty 16

## 2019-09-21 NOTE — H&P (Signed)
History and Physical    Donald Conley JME:[id number] DOB: 02-23-1936 DOA: 09/21/2019  Referring MD/NP/PA: Dr. Laverta Baltimore. PCP: Kirk Ruths, MD  Patient coming from: Home  Chief Complaint: Slurred speech, dysphagia, dizziness and weakness on the left side.  HPI: Donald Conley is a 84 y.o. 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  Patient woke up earlier not noticing any significant abnormality, he used the bathroom and then went to bed again; after waking up and trying to eat breakfast noticing difficulty swallowing and at the moment was noted by family members difficulty on his speech and left facial droop.  Patient was brought to the hospital for further evaluation and management. Of note, patient reported while attempting to eat experiencing some nausea and vomiting after having difficulty passing the food from the back of his throat; it was just the content of the food that he was trying to wait seen in the vomitus.  Patient also expressed having some left-sided weakness. No fever, no chest pain, no palpitation, no dysuria, no hematuria, no cough, no shortness of breath, no melena, no hematochezia or any other complaints.  In the ED work-up demonstrated CT scan of the head negative for acute intracranial normalities, no signs of acute infection on her chest x-ray; UA without signs of infection, normal electrolytes, renal function WBCs and hemoglobin.  No EKGs abnormalities.  Neurology was consulted which recommended admission for TIA/CVA work-up.  TRH was consulted to facilitate further evaluation and management.  Past Medical/Surgical  History: Past Medical History:  Diagnosis Date  . Allergy   . Arthritis   . Cataract   . GERD (gastroesophageal reflux disease)     Past Surgical History:  Procedure Laterality Date  . REPLACEMENT TOTAL KNEE BILATERAL    . TOTAL HIP ARTHROPLASTY Left   . WRIST ARTHROPLASTY Right     Social History:  reports that he has quit smoking. He has a 24.00 pack-year smoking history. He has never used smokeless tobacco. He reports that he does not drink alcohol or use drugs.  Allergies: No Known Allergies  Family History:  Family History  Problem Relation Age of Onset  . Cancer Father     Prior to Admission medications   Medication Sig Start Date End Date Taking? Authorizing Provider  acetaminophen (TYLENOL) 650 MG CR tablet Take 650 mg by mouth as needed.   Yes [provider]  Ascorbic Acid (VITAMIN C) 1000 MG tablet Take 1,000 mg by mouth daily.   Yes [provider]  Calcium Carb-Cholecalciferol (CALCIUM 1000 + D PO) Take 1 tablet by mouth daily.   Yes [provider]  Cholecalciferol (VITAMIN D-1000 MAX ST) 1000 units tablet Take 1,000 Units by mouth daily.   Yes [provider]  clopidogrel (PLAVIX) 75 MG tablet Take 75 mg by mouth daily.   Yes [provider]  doxazosin (CARDURA) 8 MG tablet Take 8 mg by mouth daily. 04/12/17  Yes [provider]  fexofenadine (ALLEGRA) 180 MG tablet Take 180 mg by mouth daily. 07/19/17  Yes [provider]  finasteride (PROSCAR) 5 MG tablet Take 5 mg by mouth daily. 04/12/17  Yes [provider]  fluticasone (FLONASE) 50 MCG/ACT nasal spray Place 1  spray into both nostrils daily.   Yes [provider]  metFORMIN (GLUCOPHAGE) 500 MG tablet Take 500 mg by mouth 2 (two) times daily with a meal.   Yes [provider]  Omega-3 Fatty Acids (KP FISH OIL) 1200 MG CAPS Take 1,200 mg by mouth daily.   Yes [provider]  omeprazole (PRILOSEC) 20 MG capsule  Take 20 mg by mouth daily. 04/12/17  Yes [provider]  psyllium (METAMUCIL) 58.6 % powder Take 1 packet by mouth daily.   Yes [provider]  simvastatin (ZOCOR) 20 MG tablet Take 20 mg by mouth daily. 04/12/17  Yes [provider]    Review of Systems: Negative except as otherwise mentioned in HPI.  Physical Exam: Vitals:   09/21/19 1430 09/21/19 1500 09/21/19 1530 09/21/19 1712  BP: (!) 146/80 139/69 133/68 136/77  Pulse: 82 78 82 81  Resp: (!) 26 19 (!) 22 (!) 21  Temp:    98 F (36.7 C)  TempSrc:    Oral  SpO2: 97% 96% 96% 96%  Weight:      Height:       Constitutional: reports some difficulties finding words and feeling weak on left side. Calm, comfortable; no fever, no nausea, no vomiting. Eyes: PERRL, lids and conjunctivae normal, no icterus. ENMT: Mucous membranes are moist. Posterior pharynx clear of any exudate or lesions. No thrush  Neck: normal, supple, no masses, no thyromegaly, no JVD Respiratory: clear to auscultation bilaterally, no wheezing, no crackles. Normal respiratory effort. No accessory muscle use.  Good oxygen saturation on room air. Cardiovascular: Regular rate and rhythm, no murmurs / rubs / gallops. No extremity edema. 2+ pedal pulses.  Abdomen: no tenderness, no masses palpated. No hepatosplenomegaly. Bowel sounds positive.  Musculoskeletal: no clubbing / cyanosis. No joint deformity upper and lower extremities. Good ROM, no contractures. Normal muscle tone.  Skin: no rashes, lesions, ulcers. No petechiae. Neurologic: CN 2-12 grossly intact. Sensation intact, DTR normal. Strength 4/5 in his upper and lower extremities; no significant weakness on right side appreciated. Positive left facial droop. Psychiatric: Normal judgment and insight. Alert and oriented x 3. Normal mood.    Labs on Admission: I have personally reviewed the following labs and imaging studies  CBC: Recent Labs  Lab 09/21/19 1027  WBC 9.8  NEUTROABS  8.0*  HGB 15.3  HCT 45.5  MCV 88.9  PLT 257   Basic Metabolic Panel: Recent Labs  Lab 09/21/19 1027  NA 138  K 3.7  CL 99  CO2 26  GLUCOSE 218*  BUN 11  CREATININE 0.79  CALCIUM 9.2   GFR: Estimated Creatinine Clearance: 82.1 mL/min (by C-G formula based on SCr of 0.79 mg/dL).   Liver Function Tests: Recent Labs  Lab 09/21/19 1027  AST 19  ALT 19  ALKPHOS 46  BILITOT 1.1  PROT 7.4  ALBUMIN 4.1   Recent Labs  Lab 09/21/19 1027  LIPASE 22   Thyroid Function Tests: Recent Labs    09/21/19 1027  TSH 0.650   Urine analysis:    Component Value Date/Time   COLORURINE YELLOW 03/30/2019 0916   APPEARANCEUR CLEAR 03/30/2019 0916   APPEARANCEUR Clear 04/10/2013 1155   LABSPEC 1.020 03/30/2019 0916   LABSPEC 1.025 04/10/2013 1155   PHURINE 5.0 03/30/2019 0916   GLUCOSEU 50 (A) 03/30/2019 0916   GLUCOSEU Negative 04/10/2013 1155   HGBUR NEGATIVE 03/30/2019 0916   BILIRUBINUR NEGATIVE 03/30/2019 0916   BILIRUBINUR Negative 04/10/2013 1155   KETONESUR NEGATIVE  03/30/2019 0916   PROTEINUR NEGATIVE 03/30/2019 0916   NITRITE NEGATIVE 03/30/2019 0916   LEUKOCYTESUR NEGATIVE 03/30/2019 0916   LEUKOCYTESUR Negative 04/10/2013 1155    Recent Results (from the past 240 hour(s))  Respiratory Panel by RT PCR (Flu A&B, Covid) - Nasopharyngeal Swab     Status: None   Collection Time: 09/21/19 10:53 AM   Specimen: Nasopharyngeal Swab  Result Value Ref Range Status   SARS Coronavirus 2 by RT PCR NEGATIVE NEGATIVE Final    Comment: (NOTE) SARS-CoV-2 target nucleic acids are NOT DETECTED. The SARS-CoV-2 RNA is generally detectable in upper respiratoy specimens during the acute phase of infection. The lowest concentration of SARS-CoV-2 viral copies this assay can detect is 131 copies/mL. A negative result does not preclude SARS-Cov-2 infection and should not be used as the sole basis for treatment or other patient management decisions. A negative result may occur with   improper specimen collection/handling, submission of specimen other than nasopharyngeal swab, presence of viral mutation(s) within the areas targeted by this assay, and inadequate number of viral copies (<131 copies/mL). A negative result must be combined with clinical observations, patient history, and epidemiological information. The expected result is Negative. Fact Sheet for Patients:  https://www.moore.com/https://www.fda.gov/media/142436/download Fact Sheet for Healthcare Providers:  https://www.young.biz/https://www.fda.gov/media/142435/download This test is not yet ap proved or cleared by the [place] FDA and  has been authorized for detection and/or diagnosis of SARS-CoV-2 by FDA under an Emergency Use Authorization (EUA). This EUA will remain  in effect (meaning this test can be used) for the duration of the COVID-19 declaration under Section 564(b)(1) of the Act, 21 U.S.C. section 360bbb-3(b)(1), unless the authorization is terminated or revoked sooner.    Influenza A by PCR NEGATIVE NEGATIVE Final   Influenza B by PCR NEGATIVE NEGATIVE Final    Comment: (NOTE) The Xpert Xpress SARS-CoV-2/FLU/RSV assay is intended as an aid in  the diagnosis of influenza from Nasopharyngeal swab specimens and  should not be used as a sole basis for treatment. Nasal washings and  aspirates are unacceptable for Xpert Xpress SARS-CoV-2/FLU/RSV  testing. Fact Sheet for Patients: https://www.moore.com/https://www.fda.gov/media/142436/download Fact Sheet for Healthcare Providers: https://www.young.biz/https://www.fda.gov/media/142435/download This test is not yet approved or cleared by the [place] FDA and  has been authorized for detection and/or diagnosis of SARS-CoV-2 by  FDA under an Emergency Use Authorization (EUA). This EUA will remain  in effect (meaning this test can be used) for the duration of the  Covid-19 declaration under Section 564(b)(1) of the Act, 21  U.S.C. section 360bbb-3(b)(1), unless the authorization is  terminated or revoked. Performed at  [facility], [address] [place], [place] [id number]      Radiological Exams on Admission: CT Head Wo Contrast  Result Date: 09/21/2019 CLINICAL DATA:  Dysphagia EXAM: CT HEAD WITHOUT CONTRAST CT NECK WITHOUT CONTRAST TECHNIQUE: Contiguous axial images were obtained from the base of the skull through the vertex without contrast. Multidetector CT imaging of the neck was performed using the standard protocol without intravenous contrast. COMPARISON:  MRI brain 03/30/2019 FINDINGS: CT HEAD Brain: There is no acute intracranial hemorrhage mass effect or edema. 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 Gray-white differentiation is otherwise preserved. Confluent areas of hypoattenuation in the supratentorial white matter nonspecific but may reflect moderate to advanced chronic microvascular ischemic changes. There is ex vacuo dilatation lateral ventricles. No extra-axial fluid collection. Vascular: There is intracranial atherosclerotic calcification at the skull base. Skull: Unremarkable. Orbits: Unremarkable. CT NECK Pharynx and larynx: Unremarkable without mass  or swelling. Salivary glands: Unremarkable. Thyroid: Subcentimeter hypodense nodule of the left thyroid lobe. No further follow-up required per current guidelines. Lymph nodes: No enlarged lymph nodes. Vascular: Calcified plaque at the common carotid bifurcations. Mastoids and visualized paranasal sinuses: Patchy retained secretions in right sphenoid and posterior ethmoid sinuses. Mild left maxillary sinus mucosal thickening. Included mastoid air cells are clear. Skeleton: Degenerative changes of the included spine. Upper chest: No apical lung mass. Other: None. IMPRESSION: No acute intracranial abnormality. Stable chronic findings detailed above No neck mass or adenopathy. Electronically Signed   By: Guadlupe Spanish M.D.   On: 09/21/2019 13:00   CT Soft Tissue Neck Wo Contrast  Result Date: 09/21/2019 CLINICAL DATA:  Dysphagia EXAM: CT HEAD  WITHOUT CONTRAST CT NECK WITHOUT CONTRAST TECHNIQUE: Contiguous axial images were obtained from the base of the skull through the vertex without contrast. Multidetector CT imaging of the neck was performed using the standard protocol without intravenous contrast. COMPARISON:  MRI brain 03/30/2019 FINDINGS: CT HEAD Brain: There is no acute intracranial hemorrhage mass effect or edema. 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 Gray-white differentiation is otherwise preserved. Confluent areas of hypoattenuation in the supratentorial white matter nonspecific but may reflect moderate to advanced chronic microvascular ischemic changes. There is ex vacuo dilatation lateral ventricles. No extra-axial fluid collection. Vascular: There is intracranial atherosclerotic calcification at the skull base. Skull: Unremarkable. Orbits: Unremarkable. CT NECK Pharynx and larynx: Unremarkable without mass or swelling. Salivary glands: Unremarkable. Thyroid: Subcentimeter hypodense nodule of the left thyroid lobe. No further follow-up required per current guidelines. Lymph nodes: No enlarged lymph nodes. Vascular: Calcified plaque at the common carotid bifurcations. Mastoids and visualized paranasal sinuses: Patchy retained secretions in right sphenoid and posterior ethmoid sinuses. Mild left maxillary sinus mucosal thickening. Included mastoid air cells are clear. Skeleton: Degenerative changes of the included spine. Upper chest: No apical lung mass. Other: None. IMPRESSION: No acute intracranial abnormality. Stable chronic findings detailed above No neck mass or adenopathy. Electronically Signed   By: Guadlupe Spanish M.D.   On: 09/21/2019 13:00   MR BRAIN WO CONTRAST  Result Date: 09/21/2019 CLINICAL DATA:  Dizziness, headache EXAM: MRI HEAD WITHOUT CONTRAST TECHNIQUE: Multiplanar, multiecho pulse sequences of the brain and surrounding structures were obtained without intravenous contrast. COMPARISON:  October 2020 FINDINGS:  Motion artifact is present. Brain: There is no acute infarction or intracranial hemorrhage. There is no intracranial mass, mass effect, or edema. There is no hydrocephalus or extra-axial fluid collection. Confluent areas of T2 hyperintensity in the supratentorial white matter are nonspecific probably reflect moderate chronic microvascular ischemic changes. 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 Small chronic cerebellar infarcts are again identified. Prominence of the ventricles and sulci reflects stable parenchymal volume loss. Vascular: Loss of the expected flow void of the intracranial proximal to mid right vertebral artery is again noted. This may reflect chronic occlusion or slow flow. Major vessel flow voids at the skull base are otherwise preserved. Skull and upper cervical spine: Normal marrow signal is preserved. Sinuses/Orbits: Mild mucosal thickening.  Orbits are unremarkable. Other: Sella is unremarkable.  Mastoid air cells are clear. IMPRESSION: No evidence of recent infarction, hemorrhage, or mass. Stable chronic findings detailed above. Electronically Signed   By: Guadlupe Spanish M.D.   On: 09/21/2019 16:59   US Carotid Bilateral (at [facility] and [facility] only)  Result Date: 09/21/2019 CLINICAL DATA:  84 year old male with a history of dizziness EXAM: BILATERAL CAROTID DUPLEX ULTRASOUND TECHNIQUE: Wallace Cullens scale imaging, color Doppler and  duplex ultrasound were performed of bilateral carotid and vertebral arteries in the neck. COMPARISON:  None. FINDINGS: Criteria: Quantification of carotid stenosis is based on velocity parameters that correlate the residual internal carotid diameter with NASCET-based stenosis levels, using the diameter of the distal internal carotid lumen as the denominator for stenosis measurement. The following velocity measurements were obtained: RIGHT ICA:  Systolic 103 cm/sec, Diastolic 23 cm/sec CCA:  106 cm/sec SYSTOLIC ICA/CCA RATIO:  0.97 ECA:  120 cm/sec LEFT ICA:  Systolic 124  cm/sec, Diastolic 26 cm/sec CCA:  91 cm/sec SYSTOLIC ICA/CCA RATIO:  1.3 ECA:  93 cm/sec Right Brachial SBP: Not acquired Left Brachial SBP: Not acquired RIGHT CAROTID ARTERY: No significant calcifications of the right common carotid artery. Intermediate waveform maintained. Heterogeneous and partially calcified plaque at the right carotid bifurcation. No significant lumen shadowing. Low resistance waveform of the right ICA. Tortuosity RIGHT VERTEBRAL ARTERY: Antegrade flow with low resistance waveform. LEFT CAROTID ARTERY: No significant calcifications of the left common carotid artery. Intermediate waveform maintained. Heterogeneous and partially calcified plaque at the left carotid bifurcation without significant lumen shadowing. Low resistance waveform of the left ICA. Tortuosity LEFT VERTEBRAL ARTERY:  Antegrade flow with low resistance waveform. IMPRESSION: Color duplex indicates minimal heterogeneous and calcified plaque, with no hemodynamically significant stenosis by duplex criteria in the extracranial cerebrovascular circulation. Signed, Yvone Neu. Reyne Dumas, RPVI [facility] Electronically Signed   By: Gilmer Mor D.O.   On: 09/21/2019 16:22   DG Chest Portable 1 View  Result Date: 09/21/2019 CLINICAL DATA:  Dysphagia EXAM: PORTABLE CHEST 1 VIEW COMPARISON:  10/10/2012.  CT 11/28/2018 FINDINGS: Heart is normal size. No confluent opacities, effusions or edema. No acute bony abnormality. IMPRESSION: No active disease. Electronically Signed   By: Charlett Nose M.D.   On: 09/21/2019 10:56    EKG: Independently reviewed.  Sinus rhythm, normal rate, normal QT; no acute ischemic changes.  Assessment/Plan 1-stroke-like symptoms: TIA vs acute ischemic stroke  -Patient with dysphagia and left-sided weakness -Prior history of a stroke also affecting the left side (no significant residual deficits after previous nonhemorrhagic CVA). -Risk factors  includes hypertension, diabetes, hyperlipidemia, prior hx of stroke and age. -For now we will continue the use of Plavix -Neurology service has been consulted -Check an MRI, carotid Dopplers, 2D echo, PT, OT and SPL -Will also check TSH, B12 and lipid panel -Follow clinical response.  2-essential hypertension -Blood pressure stable -Will allow permissive hypertension in the setting of acute ischemia  3-BPH -Currently without complaints of symptomatic urinary retention -Continue Proscar and Cardura  4-hyperlipidemia -As mentioned above we will check lipid panel -Continue Zocor  5-type 2 diabetes mellitus (chronically not on insulin therapy) -Holding oral hypoglycemic agents while inpatient -Follow CBGs and A1c -Patient started on sliding scale insulin.  6-allergy rhinitis -Continue Flonase and loratadine  7-GERD -Continue PPI.   DVT prophylaxis: Heparin Code Status: Full code Family Communication: no family at bedside. Disposition Plan: Discharge either home or SNF base on evaluation by PT/OT after stroke/TIA work up completed.  Consults called: neurology   Admission status:    Time Spent: 70 minutes.  Vassie Loll MD [facility] Pager [phone]  09/21/2019, 5:59 PM

## 2019-09-21 NOTE — ED Notes (Signed)
PT to MRI

## 2019-09-21 NOTE — ED Provider Notes (Signed)
Emergency Department Provider Note   I have reviewed the triage vital signs and the nursing notes.   HISTORY  Chief Complaint Dysphagia   HPI Donald Conley is a 84 y.o. male with past history of GERD and report of dysphagia in the past but unsure of the ultimate diagnosis returns emergency department with inability to swallow with vomiting.  Patient states that he awoke this morning and was feeling overall well.  He had some dizzy/lightheaded feeling this morning along with headache.  He was able to get up and walk around without difficulty.  He went back to bed and awoke at 7:30 AM with difficulty swallowing. Did not drink anything earlier in the night so cannot comment on dysphagia in the night. States that he tried to eat eggs and cereal but he "could not get it to go down."  Tells me he just seemed to sit in the back of his mouth.  He does not feel tightness or soreness in his throat as if something is stuck.  He did not take an initial bite of food and then have difficulty.  He states for a while he was having to spit out saliva as well but that this is improved and he is now able to swallow his own saliva.  He denies any unilateral weakness or numbness.  No vision changes.  No speech changes. Denies any known history of esophageal food impaction or need for dilation. No prior CVA history.    Past Medical History:  Diagnosis Date  . Allergy   . Arthritis   . Cataract   . GERD (gastroesophageal reflux disease)     Patient Active Problem List   Diagnosis Date Noted  . Stroke-like symptoms 09/21/2019  . Chronic pulmonary embolism without acute cor pulmonale (HCC) 11/03/2017    Past Surgical History:  Procedure Laterality Date  . REPLACEMENT TOTAL KNEE BILATERAL    . TOTAL HIP ARTHROPLASTY Left   . WRIST ARTHROPLASTY Right     Allergies Patient has no known allergies.  Family History  Problem Relation Age of Onset  . Cancer Father     Social History Social History     Tobacco Use  . Smoking status: Former Smoker    Packs/day: 4.00    Years: 6.00    Pack years: 24.00  . Smokeless tobacco: Never Used  Substance Use Topics  . Alcohol use: Never  . Drug use: Never    Review of Systems  Constitutional: No fever/chills Eyes: No visual changes. ENT: No sore throat.  Cardiovascular: Denies chest pain. Respiratory: Denies shortness of breath. Gastrointestinal: No abdominal pain.  Positive dysphagia. Positive vomiting.  No diarrhea.  No constipation. Genitourinary: Negative for dysuria. Musculoskeletal: Negative for back pain. Skin: Negative for rash. Neurological: Negative for headaches, focal weakness or numbness.  10-point ROS otherwise negative.  ____________________________________________   PHYSICAL EXAM:  VITAL SIGNS: ED Triage Vitals  Enc Vitals Group     BP 09/21/19 1012 (!) 174/122     Pulse Rate 09/21/19 1012 (!) 114     Resp 09/21/19 1012 18     Temp 09/21/19 1012 97.8 F (36.6 C)     Temp Source 09/21/19 1012 Oral     SpO2 09/21/19 1012 97 %     Weight 09/21/19 1009 200 lb (90.7 kg)     Height 09/21/19 1009 5\' 11"  (1.803 m)   Constitutional: Alert and oriented. Well appearing and in no acute distress. Eyes: Conjunctivae are normal.  Head: Atraumatic.  Nose: No congestion/rhinnorhea. Mouth/Throat: Mucous membranes are moist. Normal oropharynx. Managing oral secretions. Soft submandibular compartment.  Neck: No stridor.  Cardiovascular: Tachycardia. Good peripheral circulation. Grossly normal heart sounds.   Respiratory: Normal respiratory effort.  No retractions. Lungs CTAB. Gastrointestinal: Soft and nontender. No distention.  Musculoskeletal: No lower extremity tenderness nor edema. No gross deformities of extremities. Neurologic:  Normal speech and language. No gross focal neurologic deficits are appreciated. Equal strength and sensation in the bilateral upper and lower extremities without drift.  Skin:  Skin is warm,  dry and intact. No rash noted.  ____________________________________________   LABS (all labs ordered are listed, but only abnormal results are displayed)  Labs Reviewed  COMPREHENSIVE METABOLIC PANEL - Abnormal; Notable for the following components:      Result Value   Glucose, Bld 218 (*)    All other components within normal limits  CBC WITH DIFFERENTIAL/PLATELET - Abnormal; Notable for the following components:   Neutro Abs 8.0 (*)    All other components within normal limits  RESPIRATORY PANEL BY RT PCR (FLU A&B, COVID)  LIPASE, BLOOD  TSH  VITAMIN B12   ____________________________________________  EKG   EKG Interpretation  Date/Time:  Thursday September 21 2019 10:15:13 EDT Ventricular Rate:  99 PR Interval:    QRS Duration: 89 QT Interval:  366 QTC Calculation: 470 R Axis:     Text Interpretation: Sinus rhythm Abnormal R-wave progression, early transition No STEMI Confirmed by Alona Bene [phone]) on 09/21/2019 4:57:27 PM       ____________________________________________  RADIOLOGY  CT Head Wo Contrast  Result Date: 09/21/2019 CLINICAL DATA:  Dysphagia EXAM: CT HEAD WITHOUT CONTRAST CT NECK WITHOUT CONTRAST TECHNIQUE: Contiguous axial images were obtained from the base of the skull through the vertex without contrast. Multidetector CT imaging of the neck was performed using the standard protocol without intravenous contrast. COMPARISON:  MRI brain 03/30/2019 FINDINGS: CT HEAD Brain: There is no acute intracranial hemorrhage mass effect or edema. 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 Gray-white differentiation is otherwise preserved. Confluent areas of hypoattenuation in the supratentorial white matter nonspecific but may reflect moderate to advanced chronic microvascular ischemic changes. There is ex vacuo dilatation lateral ventricles. No extra-axial fluid collection. Vascular: There is intracranial atherosclerotic calcification at the skull base. Skull:  Unremarkable. Orbits: Unremarkable. CT NECK Pharynx and larynx: Unremarkable without mass or swelling. Salivary glands: Unremarkable. Thyroid: Subcentimeter hypodense nodule of the left thyroid lobe. No further follow-up required per current guidelines. Lymph nodes: No enlarged lymph nodes. Vascular: Calcified plaque at the common carotid bifurcations. Mastoids and visualized paranasal sinuses: Patchy retained secretions in right sphenoid and posterior ethmoid sinuses. Mild left maxillary sinus mucosal thickening. Included mastoid air cells are clear. Skeleton: Degenerative changes of the included spine. Upper chest: No apical lung mass. Other: None. IMPRESSION: No acute intracranial abnormality. Stable chronic findings detailed above No neck mass or adenopathy. Electronically Signed   By: Guadlupe Spanish M.D.   On: 09/21/2019 13:00   CT Soft Tissue Neck Wo Contrast  Result Date: 09/21/2019 CLINICAL DATA:  Dysphagia EXAM: CT HEAD WITHOUT CONTRAST CT NECK WITHOUT CONTRAST TECHNIQUE: Contiguous axial images were obtained from the base of the skull through the vertex without contrast. Multidetector CT imaging of the neck was performed using the standard protocol without intravenous contrast. COMPARISON:  MRI brain 03/30/2019 FINDINGS: CT HEAD Brain: There is no acute intracranial hemorrhage mass effect or edema. 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 Gray-white differentiation is otherwise preserved. Confluent  areas of hypoattenuation in the supratentorial white matter nonspecific but may reflect moderate to advanced chronic microvascular ischemic changes. There is ex vacuo dilatation lateral ventricles. No extra-axial fluid collection. Vascular: There is intracranial atherosclerotic calcification at the skull base. Skull: Unremarkable. Orbits: Unremarkable. CT NECK Pharynx and larynx: Unremarkable without mass or swelling. Salivary glands: Unremarkable. Thyroid: Subcentimeter hypodense nodule of the left  thyroid lobe. No further follow-up required per current guidelines. Lymph nodes: No enlarged lymph nodes. Vascular: Calcified plaque at the common carotid bifurcations. Mastoids and visualized paranasal sinuses: Patchy retained secretions in right sphenoid and posterior ethmoid sinuses. Mild left maxillary sinus mucosal thickening. Included mastoid air cells are clear. Skeleton: Degenerative changes of the included spine. Upper chest: No apical lung mass. Other: None. IMPRESSION: No acute intracranial abnormality. Stable chronic findings detailed above No neck mass or adenopathy. Electronically Signed   By: Macy Mis M.D.   On: 09/21/2019 13:00   US Carotid Bilateral (at [facility] and [facility] only)  Result Date: 09/21/2019 CLINICAL DATA:  84 year old male with a history of dizziness EXAM: BILATERAL CAROTID DUPLEX ULTRASOUND TECHNIQUE: Pearline Cables scale imaging, color Doppler and duplex ultrasound were performed of bilateral carotid and vertebral arteries in the neck. COMPARISON:  None. FINDINGS: Criteria: Quantification of carotid stenosis is based on velocity parameters that correlate the residual internal carotid diameter with NASCET-based stenosis levels, using the diameter of the distal internal carotid lumen as the denominator for stenosis measurement. The following velocity measurements were obtained: RIGHT ICA:  Systolic 619 cm/sec, Diastolic 23 cm/sec CCA:  509 cm/sec SYSTOLIC ICA/CCA RATIO:  3.26 ECA:  120 cm/sec LEFT ICA:  Systolic 712 cm/sec, Diastolic 26 cm/sec CCA:  91 cm/sec SYSTOLIC ICA/CCA RATIO:  1.3 ECA:  93 cm/sec Right Brachial SBP: Not acquired Left Brachial SBP: Not acquired RIGHT CAROTID ARTERY: No significant calcifications of the right common carotid artery. Intermediate waveform maintained. Heterogeneous and partially calcified plaque at the right carotid bifurcation. No significant lumen shadowing. Low resistance waveform of the right ICA. Tortuosity RIGHT VERTEBRAL ARTERY: Antegrade flow with low  resistance waveform. LEFT CAROTID ARTERY: No significant calcifications of the left common carotid artery. Intermediate waveform maintained. Heterogeneous and partially calcified plaque at the left carotid bifurcation without significant lumen shadowing. Low resistance waveform of the left ICA. Tortuosity LEFT VERTEBRAL ARTERY:  Antegrade flow with low resistance waveform. IMPRESSION: Color duplex indicates minimal heterogeneous and calcified plaque, with no hemodynamically significant stenosis by duplex criteria in the extracranial cerebrovascular circulation. Signed, Dulcy Fanny. Dellia Nims, RPVI [facility] Electronically Signed   By: Corrie Mckusick D.O.   On: 09/21/2019 16:22   DG Chest Portable 1 View  Result Date: 09/21/2019 CLINICAL DATA:  Dysphagia EXAM: PORTABLE CHEST 1 VIEW COMPARISON:  10/10/2012.  CT 11/28/2018 FINDINGS: Heart is normal size. No confluent opacities, effusions or edema. No acute bony abnormality. IMPRESSION: No active disease. Electronically Signed   By: Rolm Baptise M.D.   On: 09/21/2019 10:56    ____________________________________________   PROCEDURES  Procedure(s) performed:   Procedures  None ____________________________________________   INITIAL IMPRESSION / ASSESSMENT AND PLAN / ED COURSE  Pertinent labs & imaging results that were available during my care of the patient were reviewed by me and considered in my medical decision making (see chart for details).   Patient presents emergency department for evaluation of acute onset dysphagia.  No history or symptoms at this time to suspect esophageal food impaction.  Patient is tolerating his saliva.  No  focal neurologic deficits.  Plan for CT imaging and reassess.  Acute stroke is on my differential.  Patient seems to have woken up with symptoms and so last seen normal was last night.  He falls out of the code stroke window to consider TPA.  12:15  PM Patient's daughter is now at bedside.  She was with him this morning and notes that in addition to the dysphagia as she noticed some change in his speech and face droop which is noted on exam here as well.  No better timeline to establish last seen normal other than last night.  She felt like he was relatively normal this morning but then almost immediately could not swallow without exhibiting other symptoms.   Spoke with Neuro after their evaluation. Agree with admit for CVA/TIA w/u. On their assessment the symptoms are improved from prior. Labs and CT head and neck reviewed. No acute findings.   Discussed patient's case with [facility], Dr. Gwenlyn Perking to request admission. Patient and family (if present) updated with plan. Care transferred to [facility] service.  I reviewed all nursing notes, vitals, pertinent old records, EKGs, labs, imaging (as available).  ____________________________________________  FINAL CLINICAL IMPRESSION(S) / ED DIAGNOSES  Final diagnoses:  Stroke-like symptoms     MEDICATIONS GIVEN DURING THIS VISIT:  Medications  heparin injection 5,000 Units (has no administration in time range)   stroke: mapping our early stages of recovery book (has no administration in time range)  0.9 %  sodium chloride infusion (has no administration in time range)  acetaminophen (TYLENOL) tablet 650 mg (has no administration in time range)    Or  acetaminophen (TYLENOL) 160 MG/5ML solution 650 mg (has no administration in time range)    Or  acetaminophen (TYLENOL) suppository 650 mg (has no administration in time range)  senna-docusate (Senokot-S) tablet 1 tablet (has no administration in time range)  sodium chloride 0.9 % bolus 500 mL (0 mLs Intravenous Stopped 09/21/19 1437)  LORazepam (ATIVAN) injection 1 mg (1 mg Intravenous Given 09/21/19 1546)    Note:  This document was prepared using Dragon voice recognition software and may include unintentional dictation errors.  Alona Bene, MD,  [facility] Emergency Medicine    Caretha Rumbaugh, Arlyss Repress, MD 09/21/19 [phone]

## 2019-09-21 NOTE — ED Triage Notes (Signed)
Pt reports went to bed last night at 8pm and felt normal.  Woke up at 5am feeling dizzy and had headache.  Says was able to walk to restroom then went back to bed.  GOt up again at 7:30 and tried to eat but says couldn't swallow.  Pt says he tried to eat egg then cereal but says had to spit it out.  Pt has emesis bag and says has been spitting up.  Denies any weakness.

## 2019-09-21 NOTE — Consult Note (Signed)
[facility] TeleNeurology Consult Services  Stat Consult  Date of Service:   09/21/2019 13:37:32  Impression:     .  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  Recommendations:     .  Antiplatelet Therapy     .  admit for TIA/stroke evaluation; check swallow; give ASA 325 now; IV fluids with NS; permissive HTN SBP 140-200, dvt prophy is ok; check cbc, bmp, tsh, lipid panel, hga1c, u/a, CXR   Imaging Studies:     .  MRI Head  Therapies:     .  Physical Therapy, Occupational Therapy, Speech Therapy Assessment When Applicable  Other WorkUp:     .  Check TSH  Disposition: Neurology Follow Up Recommended  Sign Out:     .  Discussed with Emergency Department Provider  ----------------------------------------------------------------------------------------------------  Chief Complaint: slurred speech, L facial droop, dysphagia  History of Present Illness: Patient is a 84 year old Male.  P/w 730 am noted to have difficulty swallowing and egg and wife noted slurred speech, with nausea/emesis; dizziness at 5 am this morning; in ER noted to have L facial droop CT head NEG for any acute findings but exam notable for mild L facial droop, mild L arm and leg drift; wife reports this was the same side as his prior stroke            Examination: BP(SBP 140-200), Pulse(<120), Blood Glucose(<110) 1A: Level of Consciousness - Alert; keenly responsive + 0 1B: Ask Month and Age - Both Questions Right + 0 1C: Blink Eyes & Squeeze Hands - Performs Both Tasks + 0 2: Test Horizontal Extraocular Movements - Normal + 0 3: Test Visual Fields - No Visual Loss + 0 4: Test Facial Palsy (Use Grimace if Obtunded) - Minor paralysis (flat nasolabial fold, smile asymmetry) + 1 5A: Test Left Arm Motor Drift - Drift, but doesn't hit bed + 1 5B: Test Right Arm Motor Drift - No Drift for 10 Seconds + 0 6A: Test Left Leg Motor Drift - Drift, but doesn't hit bed + 1 6B: Test Right Leg Motor Drift - No Drift for 5 Seconds + 0 7: Test Limb Ataxia (FNF/Heel-Shin) - No Ataxia + 0 8: Test Sensation - Normal; No sensory loss + 0 9: Test Language/Aphasia - Normal; No aphasia + 0 10: Test Dysarthria - Normal + 0 11: Test Extinction/Inattention - No abnormality + 0  NIHSS Score: 3   Patient/Family was informed the Neurology Consult would occur via TeleHealth consult by way of interactive audio and video telecommunications and consented to receiving care in this manner.  Due to the immediate potential for life-threatening deterioration due to underlying acute neurologic illness, I spent 30 minutes providing critical care. This time includes time for face to face visit via telemedicine, review of medical records, imaging studies and discussion of findings with providers, the patient and/or family.   Dr Agustin Cree   [facility] [phone]  Case [id number]

## 2019-09-21 NOTE — ED Notes (Signed)
Pt in bed, monitor in place, pt has equal strength bilaterally, resps even and unlabored, pt has slight R facial droop, md aware.  Pt reports 1/10 headache, states that he doesn't need anything at this time

## 2019-09-22 ENCOUNTER — Observation Stay (HOSPITAL_COMMUNITY): Payer: Medicare PPO

## 2019-09-22 DIAGNOSIS — E1169 Type 2 diabetes mellitus with other specified complication: Secondary | ICD-10-CM | POA: Diagnosis not present

## 2019-09-22 DIAGNOSIS — E785 Hyperlipidemia, unspecified: Secondary | ICD-10-CM

## 2019-09-22 DIAGNOSIS — N400 Enlarged prostate without lower urinary tract symptoms: Secondary | ICD-10-CM

## 2019-09-22 DIAGNOSIS — R2990 Unspecified symptoms and signs involving the nervous system: Secondary | ICD-10-CM | POA: Diagnosis not present

## 2019-09-22 DIAGNOSIS — I10 Essential (primary) hypertension: Secondary | ICD-10-CM | POA: Diagnosis not present

## 2019-09-22 DIAGNOSIS — I361 Nonrheumatic tricuspid (valve) insufficiency: Secondary | ICD-10-CM

## 2019-09-22 DIAGNOSIS — K219 Gastro-esophageal reflux disease without esophagitis: Secondary | ICD-10-CM

## 2019-09-22 LAB — BASIC METABOLIC PANEL
Anion gap: 10 (ref 5–15)
BUN: 10 mg/dL (ref 8–23)
CO2: 26 mmol/L (ref 22–32)
Calcium: 8.7 mg/dL — ABNORMAL LOW (ref 8.9–10.3)
Chloride: 104 mmol/L (ref 98–111)
Creatinine, Ser: 0.61 mg/dL (ref 0.61–1.24)
GFR calc Af Amer: >60 mL/min (ref >60)
GFR calc non Af Amer: >60 mL/min (ref >60)
Glucose, Bld: 122 mg/dL — ABNORMAL HIGH (ref 70–99)
Potassium: 3.4 mmol/L — ABNORMAL LOW (ref 3.5–5.1)
Sodium: 140 mmol/L (ref 135–145)

## 2019-09-22 LAB — GLUCOSE, CAPILLARY
Glucose-Capillary: 141 mg/dL — ABNORMAL HIGH (ref 70–99)
Glucose-Capillary: 151 mg/dL — ABNORMAL HIGH (ref 70–99)
Glucose-Capillary: 117 mg/dL — ABNORMAL HIGH (ref 70–99)
Glucose-Capillary: 109 mg/dL — ABNORMAL HIGH (ref 70–99)

## 2019-09-22 LAB — LIPID PANEL
Cholesterol: 168 mg/dL (ref 0–200)
HDL: 36 mg/dL — ABNORMAL LOW (ref >40)
LDL Cholesterol: 110 mg/dL — ABNORMAL HIGH (ref 0–99)
Total CHOL/HDL Ratio: 4.7 RATIO
Triglycerides: 110 mg/dL (ref <150)
VLDL: 22 mg/dL (ref 0–40)

## 2019-09-22 LAB — HEMOGLOBIN A1C
Hgb A1c MFr Bld: 6.6 % — ABNORMAL HIGH (ref 4.8–5.6)
Mean Plasma Glucose: 142.72 mg/dL

## 2019-09-22 LAB — ECHOCARDIOGRAM COMPLETE
Height: 71 in
Weight: 3200 oz

## 2019-09-22 LAB — CBC
HCT: 41.5 % (ref 39.0–52.0)
Hemoglobin: 14.1 g/dL (ref 13.0–17.0)
MCH: 30.2 pg (ref 26.0–34.0)
MCHC: 34 g/dL (ref 30.0–36.0)
MCV: 88.9 fL (ref 80.0–100.0)
Platelets: 263 10*3/uL (ref 150–400)
RBC: 4.67 MIL/uL (ref 4.22–5.81)
RDW: 12.8 % (ref 11.5–15.5)
WBC: 11 10*3/uL — ABNORMAL HIGH (ref 4.0–10.5)
nRBC: 0 % (ref 0.0–0.2)

## 2019-09-22 MED ORDER — PANTOPRAZOLE SODIUM 40 MG IV SOLR
40.0000 mg | INTRAVENOUS | Status: DC
  Administered 2019-09-22 – 2019-09-26 (×5): 40 mg via INTRAVENOUS
  Filled 2019-09-22 (×6): qty 40

## 2019-09-22 MED ORDER — ASPIRIN 325 MG PO TABS
325.0000 mg | ORAL_TABLET | Freq: Every day | ORAL | Status: DC
  Filled 2019-09-22 (×2): qty 1

## 2019-09-22 MED ORDER — IOHEXOL 350 MG/ML SOLN
100.0000 mL | Freq: Once | INTRAVENOUS | Status: AC | PRN
  Administered 2019-09-22: 100 mL via INTRAVENOUS

## 2019-09-22 MED ORDER — CYANOCOBALAMIN 1000 MCG/ML IJ SOLN
1000.0000 ug | Freq: Every day | INTRAMUSCULAR | Status: DC
  Administered 2019-09-22 – 2019-09-27 (×6): 1000 ug via INTRAMUSCULAR
  Filled 2019-09-22 (×6): qty 1

## 2019-09-22 NOTE — Evaluation (Signed)
Physical Therapy Evaluation Patient Details Name: Donald Conley MRN: [id number] DOB: 1935/10/13 Today's Date: 09/22/2019   History of Present Illness  Donald Conley is a 84 y.o. 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  Patient woke up earlier not noticing any significant abnormality, he used the bathroom and then went to bed again; after waking up and trying to eat breakfast noticing difficulty swallowing and at the moment was noted by family members difficulty on his speech and left facial droop.  Patient was brought to the hospital for further evaluation and management.    Clinical Impression  Patient limited for functional mobility as stated below secondary to BLE weakness, fatigue and poor sitting balance. Patient requires assist to transition to seated EOB. Upon seated, patient demonstrates right truncal lean with inability to hold himself up showing severely impaired postural control. Patient also not oriented to midline, when assisted to midline he feels that he is leaning to the left. Patient requires max assist for seated balance. Patient is returned to supine with daughter present for session. Patient will benefit from continued physical therapy in hospital and recommended venue below to increase strength, balance, endurance for safe ADLs and gait.     Follow Up Recommendations SNF    Equipment Recommendations  None recommended by PT    Recommendations for Other Services       Precautions / Restrictions Precautions Precautions: Fall Precaution Comments: Lean to R upon seated, poor postural control, not orientated to  midline Restrictions Weight Bearing Restrictions: No      Mobility  Bed Mobility Overal bed mobility: Needs Assistance Bed Mobility: Supine to Sit;Sit to Supine     Supine to sit: Mod assist Sit to supine: Mod assist   General bed mobility comments: patient requires mod assist to transition  to seated EOB, unable to maintain seated balance  Transfers                    Ambulation/Gait                Stairs            Wheelchair Mobility    Modified Rankin (Stroke Patients Only)       Balance Overall balance assessment: Needs assistance Sitting-balance support: Feet supported;Bilateral upper extremity supported Sitting balance-Leahy Scale: Zero Sitting balance - Comments: Patient requires max assist to sit EOB, lean to R, not oriented to midline, impaired postural control                                     Pertinent Vitals/Pain Pain Assessment: No/denies pain    Home Living Family/patient expects to be discharged to:: Private residence Living Arrangements: Children Available Help at Discharge: Family Type of Home: House Home Access: Ramped entrance     Home Layout: One level Home Equipment: Environmental consultant - 2 wheels;Walker - 4 wheels;Walker - standard;Cane - single point;Shower seat;Transport chair;Toilet riser Additional Comments: Patient had difficulty speaking, information provided by daughter    Prior Function Level of Independence: Independent         Comments: Patient and daughter state he was independent with basic  ADL, limited community ambulator without AD     Hand Dominance        Extremity/Trunk Assessment   Upper Extremity Assessment Upper Extremity Assessment: Generalized weakness    Lower Extremity Assessment Lower Extremity Assessment: Generalized weakness    Cervical / Trunk Assessment Cervical / Trunk Assessment: Kyphotic  Communication   Communication: Other (comment)(trouble speaking)   Cognition Arousal/Alertness: Awake/alert Behavior During Therapy: WFL for tasks assessed/performed Overall Cognitive Status: Within Functional Limits for tasks assessed                                        General Comments      Exercises     Assessment/Plan    PT Assessment Patient needs continued PT services  PT Problem List Decreased strength;Decreased activity tolerance;Decreased safety awareness;Decreased balance;Decreased mobility;Decreased coordination       PT Treatment Interventions DME instruction;Balance training;Gait training;Neuromuscular re-education;Stair training;Functional mobility training;Patient/family education;Therapeutic activities;Therapeutic exercise    PT Goals (Current goals can be found in the Care Plan section)  Acute Rehab PT Goals Patient Stated Goal: Recover and then go home PT Goal Formulation: With patient/family Time For Goal Achievement: 10/06/19 Potential to Achieve Goals: Fair    Frequency Min 3X/week   Barriers to discharge        Co-evaluation               AM-PAC PT "6 Clicks" Mobility  Outcome Measure Help needed turning from your back to your side while in a flat bed without using bedrails?: None Help needed moving from lying on your back to sitting on the side of a flat bed without using bedrails?: A Little Help needed moving to and from a bed to a chair (including a wheelchair)?: Total Help needed standing up from a chair using your arms (e.g., wheelchair or bedside chair)?: Total Help needed to walk in hospital room?: Total Help needed climbing 3-5 steps with a railing? : Total 6 Click Score: 11    End of Session   Activity Tolerance: Patient tolerated treatment well;Patient limited by fatigue Patient left: in bed;with call bell/phone within reach;with bed alarm set;with family/visitor present Nurse Communication: Mobility status PT Visit Diagnosis: Unsteadiness on feet (R26.81);Other  abnormalities of gait and mobility (R26.89);Muscle weakness (generalized) (M62.81);History of falling (Z91.81);Other symptoms and signs involving the nervous system (R29.898)    Time: 7371-0626 PT Time Calculation (min) (ACUTE ONLY): 21 min   Charges:   PT Evaluation $PT Eval Moderate Complexity: 1 Mod PT Treatments $Therapeutic Activity: 8-22 mins        2:50 PM, 09/22/19 Wyman Songster PT, DPT Physical Therapist at [facility]

## 2019-09-22 NOTE — Progress Notes (Signed)
SLP Cancellation Note  Patient Details Name: Donald Conley MRN: [id number] DOB: 31-Dec-1935   Cancelled treatment:       Reason Eval/Treat Not Completed: SLE - SLP screened or speech and/or language impairment; Pt is functioning at baseline for speech and language at this time. Acknowledge BSE, Report to follow for dysphagia. Thank you,  Chaos Carlile H. Romie Levee, CCC-SLP Speech Language Pathologist     Georgetta Haber 09/22/2019, 9:58 AM

## 2019-09-22 NOTE — Progress Notes (Signed)
Improvement with stroke scale as far a limb drift , ataxia this evening. Has c/o right side face numbness but can feel pin prick.  Daughter commented that he improved yesterday late in day vs the morning.  Speech still somewhat halted.  Patient never learned to read or write.

## 2019-09-22 NOTE — Evaluation (Signed)
Clinical/Bedside Swallow Evaluation Patient Details  Name: Donald Conley MRN: [id number] Date of Birth: [birth year]  Today's Date: 09/22/2019 Time: SLP Start Time (ACUTE ONLY): 0945 SLP Stop Time (ACUTE ONLY): 1016 SLP Time Calculation (min) (ACUTE ONLY): 31 min  Past Medical History:  Past Medical History:  Diagnosis Date  . Allergy   . Arthritis   . Cataract   . GERD (gastroesophageal reflux disease)    Past Surgical History:  Past Surgical History:  Procedure Laterality Date  . REPLACEMENT TOTAL KNEE BILATERAL    . TOTAL HIP ARTHROPLASTY Left   . WRIST ARTHROPLASTY Right    HPI:  Donald Conley is a 84 y.o. 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  Patient woke up earlier not noticing any significant abnormality,but after waking up and trying to eat breakfast noticing difficulty swallowing and at the moment was noted by family members difficulty on his speech and left facial droop.  Patient was brought to the hospital for further evaluation and management. MRI reveals "No evidence of recent infarction, hemorrhage, or mass. Stable chronic findings detailed above."   Assessment / Plan / Recommendation Clinical Impression  Clinical swallowing evaluation completed while Pt was sitting upright in bed. Daughter who is caregiver at home was present for evaluation reporting Pt has never had swallowing difficulty before this acute episode. pt presents with a slight R facial droop which daughter reports is new; she further reports he had a L facial droop yesterday which has resolved. Upon SLP entering the room Pt was coughing and expectorating yellow  and clear phlegm and has reportedly been vomiting. Pt was given a single ice chip which he allowed to melt and then swallowed without incident. He then attempted to swallow a cup sip of water; when attempting to swallow he began to cough and it sounded as though some of the sip passed into the pharynx and no swallow was triggered - as evidenced by a very wet and strangled sounding cough and Pt expectorated the entire thin sip.  Pt then attempted a half tsp bite of puree which was also expectorated. Note Daughter reports he had the same difficulty yesterday (unable to swallow in the morning) but that it had resolved by the evening. After expectorating bolus (both thin and puree) Pt did swallow residuals indicating Pt is capable of swallowing however he clearly is unable to safely consume textures and/or liquids at this time. Pt is not appropriate for MBS at this time secondary to inability to consume textures/consistencies. Recommend continue NPO at this time, ST will continue to follow. SLP Visit Diagnosis: Dysphagia, unspecified (R13.10)    Aspiration Risk  Moderate aspiration risk    Diet Recommendation NPO   Supervision: Patient able to self feed    Other  Recommendations Oral Care Recommendations: Oral care QID Other Recommendations: Clarify dietary restrictions   Follow up Recommendations        Frequency and Duration min 2x/week  1 week       Prognosis Prognosis for Safe Diet Advancement: Good      Swallow Study   General HPI: Donald Conley is a 84 y.o. 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  Patient woke up earlier not noticing any  significant abnormality,but after waking up and trying to eat breakfast noticing difficulty swallowing and at the moment was noted by family members difficulty on his speech and left facial droop.  Patient was brought to the hospital for further evaluation and management. MRI reveals "No evidence of recent infarction, hemorrhage, or mass. Stable chronic findings detailed above." Type of Study: Bedside Swallow Evaluation Previous Swallow Assessment: none in chart Diet Prior to this Study: NPO Temperature Spikes Noted: No Respiratory Status: Room air History of Recent Intubation: No Behavior/Cognition: Alert;Cooperative;Pleasant mood Oral Cavity Assessment: Within Functional Limits Oral Care Completed by SLP: Recent completion by staff Oral Cavity - Dentition: Dentures, top;Dentures, bottom Vision: Functional for self-feeding Self-Feeding Abilities: Able to feed self Patient Positioning: Upright in bed Baseline Vocal Quality: Normal Volitional Cough: Strong Volitional Swallow: Able to elicit    Oral/Motor/Sensory Function Overall Oral Motor/Sensory Function: Mild impairment Facial ROM: Reduced right Facial Symmetry: Abnormal symmetry right   Ice Chips Ice chips: Within functional limits   Thin Liquid Thin Liquid: Impaired Presentation: Cup Oral Phase Impairments: Poor awareness of bolus Oral Phase Functional Implications: Left anterior spillage;Right anterior spillage Pharyngeal  Phase Impairments: Wet Vocal Quality;Cough - Immediate;Throat Clearing - Immediate;Unable to trigger swallow    Nectar Thick Nectar Thick Liquid: Not tested   Honey Thick Honey Thick Liquid: Not tested   Puree Puree: Impaired Presentation: Spoon;Self Fed Oral Phase Functional Implications: Oral holding Pharyngeal Phase Impairments: Unable to trigger swallow;Throat Clearing - Immediate;Throat Clearing - Delayed;Cough - Immediate;Multiple swallows;Wet Vocal Quality   Solid     Solid: Not tested     Donald Harms  H. Romie Conley, CCC-SLP Speech Language Pathologist  Georgetta Haber 09/22/2019,10:37 AM

## 2019-09-22 NOTE — Progress Notes (Signed)
PROGRESS NOTE    Donald Conley  JJO:[id number] DOB: September 06, 1935 DOA: 09/21/2019 PCP: Lauro Regulus, MD     Brief Narrative:  84 y.o. 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  Patient woke up earlier not noticing any significant abnormality, he used the bathroom and then went to bed again; after waking up and trying to eat breakfast noticing difficulty swallowing and at the moment was noted by family members difficulty on his speech and left facial droop.  Patient was brought to the hospital for further evaluation and management. Of note, patient reported while attempting to eat experiencing some nausea and vomiting after having difficulty passing the food from the back of his throat; it was just the content of the food that he was trying to wait seen in the vomitus.  Patient also expressed having some left-sided weakness. No fever, no chest pain, no palpitation, no dysuria, no hematuria, no cough, no shortness of breath, no melena, no hematochezia or any other complaints.  In the ED work-up demonstrated CT scan of the head negative for acute intracranial normalities, no signs of acute infection on her chest x-ray; UA without signs of infection, normal electrolytes, renal function WBCs and hemoglobin.  No EKGs abnormalities.  Neurology was consulted which recommended admission for TIA/CVA work-up.  TRH was consulted to facilitate further evaluation and management   Assessment & Plan: 1-TIA/stroke like symptoms:  -Continue to have difficulty swallowing, right facial droop poor balance slurred speech -Concern for myasthenia crisis -Neurology  service consulted and will follow recommendation -Borderline B12 deficiency appreciated and repletion will be started -Continue to keep n.p.o. and provide IV medications as needed -Speech therapy to reevaluate with [facility] when appropriate. -Physical therapy recommending skilled nursing facility for rehabilitation -No acute stroke appreciated on CT, MRI it and without significant abnormalities on carotid Dopplers or 2D echo. -TSH within normal limits.  2-essential hypertension -Blood pressure has remained stable -Continue to allow permissive hypertension while acutely ruling out concerns for ischemia process.  3-BPH -Plan is to continue Proscar and Cardura unable to tolerate p.o.'s.  4-hyperlipidemia -Will resume Zocor when able to take by mouth. -LDL 110.  5-type 2 diabetes mellitus (chronically not on insulin therapy) -Continue holding oral hypoglycemic agents currently -A1c 6.6 demonstrating good control -Continue sliding scale insulin with close CBG monitoring while NPO.  6-GERD -Continue PPI; while unable to take p.o.'s will be given IV.  7-allergy rhinitis -Continue Flonase -Unable to tolerate by mouth will resume the use of loratadine.   DVT prophylaxis: Heparin Code Status: Full code. Family Communication: Daughter at bedside. Disposition Plan: Remains in the hospital; follow neurology service evaluation and recommendations.  High concerns on his presentation for myasthenia crisis.  Images and work-up negative for acute stroke. TIA part of the differential but difficult to rule out.  After discussing with his daughter there may be concerns for vascular dementia component as well.  Continue to work with speech therapy, if neurology agrees may end requiring steroids and IVIG.  Physical therapy recommended skilled nursing facility at discharge for rehabilitation and conditioning.  At this moment he is not back to his baseline functional level.  Consultants:   Neurology  service  Procedures:   See below  for x-ray reports  Carotid Dopplers: Indicating minimal heterogeneous and calcified plaque, no hemodynamically significant stenosis by Doppler criteria in the extracranial cerebrovascular circulation.  Vertebral arteries with antegrade flow appreciated.  2D echo: 1. Left ventricular ejection fraction, by estimation, is 60 to 65%. The  left ventricle has normal function. The left ventricle has no regional  wall motion abnormalities. There is mild left ventricular hypertrophy of  the posterior and basal-septal  segments. Left ventricular diastolic parameters are consistent with Grade  I diastolic dysfunction (impaired relaxation).  2. Right ventricular systolic function is normal. The right ventricular  size is normal. There is mildly elevated pulmonary artery systolic  pressure.  3. The mitral valve is degenerative. No evidence of mitral valve  regurgitation.  4. The aortic valve is tricuspid. Aortic valve regurgitation is not  visualized. No aortic stenosis is present.   Antimicrobials:  Anti-infectives (From admission, onward)   None       Subjective: Still having significant difficulty swallowing, slurred speech and difficulty finding words.  Right facial droop along with right eye dropped.  Patient is afebrile denies chest pain or shortness of breath.  Objective: Vitals:   09/22/19 0648 09/22/19 0900 09/22/19 1300 09/22/19 1323  BP: 125/60 133/61 139/68 (!) 166/76  Pulse: 62 67 63 63  Resp: Temp: 98.2 F (36.8 C) 98 F (36.7 C) 98.3 F (36.8 C) 98.3 F (36.8 C)  TempSrc: Oral Oral Oral   SpO2: 98% 97% 93% 94%  Weight:      Height:        Intake/Output Summary (Last 24 hours) at 09/22/2019 1635 Last data filed at 09/22/2019 1300 Gross per 24 hour  Intake 500 ml  Output 1150 ml  Net -650 ml   Filed Weights   09/21/19 1009  Weight: 90.7 kg    Examination: General exam: Alert, awake, oriented x 2, complaining  of dysphagia, having some slurred speech and difficulty finding words, with right eye droop and severe postural control and balance.  No fever, no chest pain, no abdominal pain. Respiratory system: Clear to auscultation. Respiratory effort normal. Cardiovascular system:RRR. No murmurs, rubs, gallops. Gastrointestinal system: Abdomen is nondistended, soft and nontender. No organomegaly or masses felt. Normal bowel sounds heard. Central nervous system: Alert and oriented x2 as mentioned above; poor balance appreciated.  Slurred speech and difficulty finding words.  Right facial droop appreciated on exam. Extremities: No cyanosis or clubbing. Skin: No rashes, no petechiae, no open wounds. Psychiatry: Mood & affect appropriate.     Data Reviewed: I have personally reviewed following labs and imaging studies  CBC: Recent Labs  Lab 09/21/19 1027 09/22/19 0455  WBC 9.8 11.0*  NEUTROABS 8.0*  --   HGB 15.3 14.1  HCT 45.5 41.5  MCV 88.9 88.9  PLT 257 263   Basic Metabolic Panel: Recent Labs  Lab 09/21/19 1027 09/22/19 0455  NA 138 140  K 3.7 3.4*  CL 99 104  CO2 26 26  GLUCOSE 218* 122*  BUN 11 10  CREATININE 0.79 0.61  CALCIUM 9.2 8.7*   GFR: Estimated Creatinine Clearance: 82.1 mL/min (by C-G formula based on SCr of 0.61 mg/dL).   Liver Function Tests: Recent Labs  Lab 09/21/19 1027  AST 19  ALT 19  ALKPHOS 46  BILITOT 1.1  PROT 7.4  ALBUMIN 4.1   Recent Labs  Lab 09/21/19 1027  LIPASE 22   HbA1C: Recent Labs    09/22/19 0455  HGBA1C 6.6*  CBG: Recent Labs  Lab 09/21/19 2056 09/22/19 0752 09/22/19 1208  GLUCAP 148* 141* 151*   Lipid Profile: Recent Labs    09/22/19 0455  CHOL 168  HDL 36*  LDLCALC 110*  TRIG 110  CHOLHDL 4.7   Thyroid Function Tests: Recent Labs    09/21/19 1027  TSH 0.650   Anemia Panel: Recent Labs    09/21/19 1027  VITAMINB12 212   Urine analysis:    Component Value Date/Time   COLORURINE YELLOW 03/30/2019  0916   APPEARANCEUR CLEAR 03/30/2019 0916   APPEARANCEUR Clear 04/10/2013 1155   LABSPEC 1.020 03/30/2019 0916   LABSPEC 1.025 04/10/2013 1155   PHURINE 5.0 03/30/2019 0916   GLUCOSEU 50 (A) 03/30/2019 0916   GLUCOSEU Negative 04/10/2013 1155   HGBUR NEGATIVE 03/30/2019 0916   BILIRUBINUR NEGATIVE 03/30/2019 0916   BILIRUBINUR Negative 04/10/2013 1155   KETONESUR NEGATIVE 03/30/2019 0916   PROTEINUR NEGATIVE 03/30/2019 0916   NITRITE NEGATIVE 03/30/2019 0916   LEUKOCYTESUR NEGATIVE 03/30/2019 0916   LEUKOCYTESUR Negative 04/10/2013 1155    Recent Results (from the past 240 hour(s))  Respiratory Panel by RT PCR (Flu A&B, Covid) - Nasopharyngeal Swab     Status: None   Collection Time: 09/21/19 10:53 AM   Specimen: Nasopharyngeal Swab  Result Value Ref Range Status   SARS Coronavirus 2 by RT PCR NEGATIVE NEGATIVE Final    Comment: (NOTE) SARS-CoV-2 target nucleic acids are NOT DETECTED. The SARS-CoV-2 RNA is generally detectable in upper respiratoy specimens during the acute phase of infection. The lowest concentration of SARS-CoV-2 viral copies this assay can detect is 131 copies/mL. A negative result does not preclude SARS-Cov-2 infection and should not be used as the sole basis for treatment or other patient management decisions. A negative result may occur with  improper specimen collection/handling, submission of specimen other than nasopharyngeal swab, presence of viral mutation(s) within the areas targeted by this assay, and inadequate number of viral copies (<131 copies/mL). A negative result must be combined with clinical observations, patient history, and epidemiological information. The expected result is Negative. Fact Sheet for Patients:  https://www.moore.com/ Fact Sheet for Healthcare Providers:  https://www.young.biz/ This test is not yet ap proved or cleared by the [place] FDA and  has been authorized for detection  and/or diagnosis of SARS-CoV-2 by FDA under an Emergency Use Authorization (EUA). This EUA will remain  in effect (meaning this test can be used) for the duration of the COVID-19 declaration under Section 564(b)(1) of the Act, 21 U.S.C. section 360bbb-3(b)(1), unless the authorization is terminated or revoked sooner.    Influenza A by PCR NEGATIVE NEGATIVE Final   Influenza B by PCR NEGATIVE NEGATIVE Final    Comment: (NOTE) The Xpert Xpress SARS-CoV-2/FLU/RSV assay is intended as an aid in  the diagnosis of influenza from Nasopharyngeal swab specimens and  should not be used as a sole basis for treatment. Nasal washings and  aspirates are unacceptable for Xpert Xpress SARS-CoV-2/FLU/RSV  testing. Fact Sheet for Patients: https://www.moore.com/ Fact Sheet for Healthcare Providers: https://www.young.biz/ This test is not yet approved or cleared by the [place] FDA and  has been authorized for detection and/or diagnosis of SARS-CoV-2 by  FDA under an Emergency Use Authorization (EUA). This EUA will remain  in effect (meaning this test can be used) for the duration of the  Covid-19 declaration under Section 564(b)(1) of the Act, 21  U.S.C. section 360bbb-3(b)(1), unless the authorization is  terminated or revoked. Performed at [facility],  [address], [place], [place] [address]       Radiology Studies: CT HEAD WO CONTRAST  Result Date: 09/22/2019 CLINICAL DATA:  Slurred speech. Dizziness EXAM: CT HEAD WITHOUT CONTRAST TECHNIQUE: Contiguous axial images were obtained from the base of the skull through the vertex without intravenous contrast. COMPARISON:  September 21, 2019. FINDINGS: Brain: Mild chronic ischemic white matter disease is noted. No mass effect or midline shift is noted. Ventricular size is within normal limits. There is no evidence of mass lesion, hemorrhage or acute infarction. Vascular: No hyperdense vessel or unexpected  calcification. Skull: Normal. Negative for fracture or focal lesion. Sinuses/Orbits: Minimal right sphenoid sinusitis is noted Other: None. IMPRESSION: Mild chronic ischemic white matter disease. Minimal right sphenoid sinusitis. No acute intracranial abnormality seen.31 Electronically Signed   By: Lupita Raider M.D.   On: 09/22/2019 11:51   CT Head Wo Contrast  Result Date: 09/21/2019 CLINICAL DATA:  Dysphagia EXAM: CT HEAD WITHOUT CONTRAST CT NECK WITHOUT CONTRAST TECHNIQUE: Contiguous axial images were obtained from the base of the skull through the vertex without contrast. Multidetector CT imaging of the neck was performed using the standard protocol without intravenous contrast. COMPARISON:  MRI brain 03/30/2019 FINDINGS: CT HEAD Brain: There is no acute intracranial hemorrhage mass effect or edema. 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 Gray-white differentiation is otherwise preserved. Confluent areas of hypoattenuation in the supratentorial white matter nonspecific but may reflect moderate to advanced chronic microvascular ischemic changes. There is ex vacuo dilatation lateral ventricles. No extra-axial fluid collection. Vascular: There is intracranial atherosclerotic calcification at the skull base. Skull: Unremarkable. Orbits: Unremarkable. CT NECK Pharynx and larynx: Unremarkable without mass or swelling. Salivary glands: Unremarkable. Thyroid: Subcentimeter hypodense nodule of the left thyroid lobe. No further follow-up required per current guidelines. Lymph nodes: No enlarged lymph nodes. Vascular: Calcified plaque at the common carotid bifurcations. Mastoids and visualized paranasal sinuses: Patchy retained secretions in right sphenoid and posterior ethmoid sinuses. Mild left maxillary sinus mucosal thickening. Included mastoid air cells are clear. Skeleton: Degenerative changes of the included spine. Upper chest: No apical lung mass. Other: None. IMPRESSION: No acute intracranial  abnormality. Stable chronic findings detailed above No neck mass or adenopathy. Electronically Signed   By: Guadlupe Spanish M.D.   On: 09/21/2019 13:00   CT Soft Tissue Neck Wo Contrast  Result Date: 09/21/2019 CLINICAL DATA:  Dysphagia EXAM: CT HEAD WITHOUT CONTRAST CT NECK WITHOUT CONTRAST TECHNIQUE: Contiguous axial images were obtained from the base of the skull through the vertex without contrast. Multidetector CT imaging of the neck was performed using the standard protocol without intravenous contrast. COMPARISON:  MRI brain 03/30/2019 FINDINGS: CT HEAD Brain: There is no acute intracranial hemorrhage mass effect or edema. 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 Gray-white differentiation is otherwise preserved. Confluent areas of hypoattenuation in the supratentorial white matter nonspecific but may reflect moderate to advanced chronic microvascular ischemic changes. There is ex vacuo dilatation lateral ventricles. No extra-axial fluid collection. Vascular: There is intracranial atherosclerotic calcification at the skull base. Skull: Unremarkable. Orbits: Unremarkable. CT NECK Pharynx and larynx: Unremarkable without mass or swelling. Salivary glands: Unremarkable. Thyroid: Subcentimeter hypodense nodule of the left thyroid lobe. No further follow-up required per current guidelines. Lymph nodes: No enlarged lymph nodes. Vascular: Calcified plaque at the common carotid bifurcations. Mastoids and visualized paranasal sinuses: Patchy retained secretions in right sphenoid and posterior ethmoid sinuses. Mild left maxillary sinus mucosal thickening. Included mastoid air cells are clear. Skeleton: Degenerative changes of  the included spine. Upper chest: No apical lung mass. Other: None. IMPRESSION: No acute intracranial abnormality. Stable chronic findings detailed above No neck mass or adenopathy. Electronically Signed   By: Guadlupe SpanishPraneil  Patel M.D.   On: 09/21/2019 13:00   MR BRAIN WO CONTRAST  Result Date:  09/21/2019 CLINICAL DATA:  Dizziness, headache EXAM: MRI HEAD WITHOUT CONTRAST TECHNIQUE: Multiplanar, multiecho pulse sequences of the brain and surrounding structures were obtained without intravenous contrast. COMPARISON:  October 2020 FINDINGS: Motion artifact is present. Brain: There is no acute infarction or intracranial hemorrhage. There is no intracranial mass, mass effect, or edema. There is no hydrocephalus or extra-axial fluid collection. Confluent areas of T2 hyperintensity in the supratentorial white matter are nonspecific probably reflect moderate chronic microvascular ischemic changes. 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 Small chronic cerebellar infarcts are again identified. Prominence of the ventricles and sulci reflects stable parenchymal volume loss. Vascular: Loss of the expected flow void of the intracranial proximal to mid right vertebral artery is again noted. This may reflect chronic occlusion or slow flow. Major vessel flow voids at the skull base are otherwise preserved. Skull and upper cervical spine: Normal marrow signal is preserved. Sinuses/Orbits: Mild mucosal thickening.  Orbits are unremarkable. Other: Sella is unremarkable.  Mastoid air cells are clear. IMPRESSION: No evidence of recent infarction, hemorrhage, or mass. Stable chronic findings detailed above. Electronically Signed   By: Guadlupe SpanishPraneil  Patel M.D.   On: 09/21/2019 16:59   [facility] Carotid [facility] and [facility] only)  Result Date: 09/21/2019 CLINICAL DATA:  84 year old male with a history of dizziness EXAM: BILATERAL CAROTID DUPLEX ULTRASOUND TECHNIQUE: Wallace CullensGray scale imaging, color Doppler and duplex ultrasound were performed of bilateral carotid and vertebral arteries in the neck. COMPARISON:  None. FINDINGS: Criteria: Quantification of carotid stenosis is based on velocity parameters that correlate the residual internal carotid diameter with NASCET-based stenosis levels, using the diameter of the distal internal  carotid lumen as the denominator for stenosis measurement. The following velocity measurements were obtained: RIGHT ICA:  Systolic 103 cm/sec, Diastolic 23 cm/sec CCA:  106 cm/sec SYSTOLIC ICA/CCA RATIO:  0.97 ECA:  120 cm/sec LEFT ICA:  Systolic 124 cm/sec, Diastolic 26 cm/sec CCA:  91 cm/sec SYSTOLIC ICA/CCA RATIO:  1.3 ECA:  93 cm/sec Right Brachial SBP: Not acquired Left Brachial SBP: Not acquired RIGHT CAROTID ARTERY: No significant calcifications of the right common carotid artery. Intermediate waveform maintained. Heterogeneous and partially calcified plaque at the right carotid bifurcation. No significant lumen shadowing. Low resistance waveform of the right ICA. Tortuosity RIGHT VERTEBRAL ARTERY: Antegrade flow with low resistance waveform. LEFT CAROTID ARTERY: No significant calcifications of the left common carotid artery. Intermediate waveform maintained. Heterogeneous and partially calcified plaque at the left carotid bifurcation without significant lumen shadowing. Low resistance waveform of the left ICA. Tortuosity LEFT VERTEBRAL ARTERY:  Antegrade flow with low resistance waveform. IMPRESSION: Color duplex indicates minimal heterogeneous and calcified plaque, with no hemodynamically significant stenosis by duplex criteria in the extracranial cerebrovascular circulation. Signed, Yvone NeuJaime S. Reyne DumasWagner, DO, RPVI [facility] Electronically Signed   By: Gilmer MorJaime  Wagner D.O.   On: 09/21/2019 16:22   DG Chest Portable 1 View  Result Date: 09/21/2019 CLINICAL DATA:  Dysphagia EXAM: PORTABLE CHEST 1 VIEW COMPARISON:  10/10/2012.  CT 11/28/2018 FINDINGS: Heart is normal size. No confluent opacities, effusions or edema. No acute bony abnormality. IMPRESSION: No active disease. Electronically Signed   By: Charlett NoseKevin  Dover M.D.   On: 09/21/2019  10:56   ECHOCARDIOGRAM COMPLETE  Result Date: 09/22/2019    ECHOCARDIOGRAM REPORT   Patient Name:   Donald Conley  Date of Exam: 09/22/2019 Medical Rec #:  [id number]      Height:       71.0 in Accession #:    [id number]     Weight:       200.0 lb Date of Birth:  [birth year]      BSA:          2.109 m Patient Age:    84 years       BP:           166/76 mmHg Patient Gender: M              HR:           63 bpm. [facility] Procedure: 2D Echo, Cardiac Doppler and Color Doppler Indications:    CVA  History:        Patient has no prior history of Echocardiogram examinations.                 Stroke and Obesity; Risk Factors:Hypertension, Dyslipidemia and                 Diabetes.  Sonographer:    Lavenia Atlas RDCS Referring Phys: [phone] Lujain Kraszewski  Sonographer Comments: Patient's position. IMPRESSIONS  1. Left ventricular ejection fraction, by estimation, is 60 to 65%. The left ventricle has normal function. The left ventricle has no regional wall motion abnormalities. There is mild left ventricular hypertrophy of the posterior and basal-septal segments. Left ventricular diastolic parameters are consistent with Grade I diastolic dysfunction (impaired relaxation).  2. Right ventricular systolic function is normal. The right ventricular size is normal. There is mildly elevated pulmonary artery systolic pressure.  3. The mitral valve is degenerative. No evidence of mitral valve regurgitation.  4. The aortic valve is tricuspid. Aortic valve regurgitation is not visualized. No aortic stenosis is present. FINDINGS  Left Ventricle: Left ventricular ejection fraction, by estimation, is 60 to 65%. The left ventricle has normal function. The left ventricle has no regional wall motion abnormalities. The left ventricular internal cavity size was normal in size. There is  mild left ventricular hypertrophy of the posterior and basal-septal segments. Left ventricular diastolic parameters are consistent with Grade I diastolic dysfunction (impaired relaxation). Indeterminate filling pressures. Right Ventricle: The right ventricular  size is normal. No increase in right ventricular wall thickness. Right ventricular systolic function is normal. There is mildly elevated pulmonary artery systolic pressure. The tricuspid regurgitant velocity is 2.75  m/s, and with an assumed right atrial pressure of 3 mmHg, the estimated right ventricular systolic pressure is 33.2 mmHg. Left Atrium: Left atrial size was normal in size. Right Atrium: Right atrial size was normal in size. Pericardium: There is no evidence of pericardial effusion. Mitral Valve: The mitral valve is degenerative in appearance. There is mild thickening of the mitral valve leaflet(s). Mild mitral annular calcification. No evidence of mitral valve regurgitation. Tricuspid Valve: The tricuspid valve is grossly normal. Tricuspid valve regurgitation is mild. Aortic Valve: The aortic valve is tricuspid. . There is mild thickening of the aortic valve. Aortic valve regurgitation is not visualized. No aortic stenosis is present. Mild aortic valve annular calcification. There is mild thickening of the aortic valve. Pulmonic Valve: The pulmonic valve was grossly normal. Pulmonic valve regurgitation is not visualized. Aorta: The aortic root is normal in size and structure. Venous: The inferior  vena cava was not well visualized. IAS/Shunts: The interatrial septum was not well visualized.  LEFT VENTRICLE PLAX 2D LVIDd:         4.78 cm  Diastology LVIDs:         2.04 cm  LV e' lateral:   7.94 cm/s LV PW:         1.25 cm  LV E/e' lateral: 10.4 LV IVS:        0.97 cm  LV e' medial:    7.07 cm/s LVOT diam:     1.90 cm  LV E/e' medial:  11.7 LV SV:         90 LV SV Index:   43 LVOT Area:     2.84 cm  RIGHT VENTRICLE RV Basal diam:  3.00 cm RV S prime:     6.31 cm/s TAPSE (M-mode): 2.4 cm LEFT ATRIUM             Index LA diam:        3.10 cm 1.47 cm/m LA Vol (A2C):   61.1 ml 28.98 ml/m LA Vol (A4C):   49.5 ml 23.48 ml/m LA Biplane Vol: 60.7 ml 28.79 ml/m  AORTIC VALVE LVOT Vmax:   135.00 cm/s LVOT  Vmean:  86.300 cm/s LVOT VTI:    0.317 m  AORTA Ao Root diam: 3.10 cm MITRAL VALVE               TRICUSPID VALVE MV Area (PHT): 2.59 cm    TR Peak grad:   30.2 mmHg MV Decel Time: 293 msec    TR Vmax:        275.00 cm/s MV E velocity: 82.60 cm/s MV A velocity: 96.80 cm/s  SHUNTS MV E/A ratio:  0.85        Systemic VTI:  0.32 m                            Systemic Diam: 1.90 cm Kate Sable MD Electronically signed by Kate Sable MD Signature Date/Time: 09/22/2019/2:31:19 PM    Final     Scheduled Meds: . vitamin C  1,000 mg Oral Daily  . cholecalciferol  1,000 Units Oral Daily  . clopidogrel  75 mg Oral Daily  . cyanocobalamin  1,000 mcg Intramuscular Daily  . doxazosin  8 mg Oral Daily  . finasteride  5 mg Oral Daily  . fluticasone  1 spray Each Nare Daily  . heparin injection (subcutaneous)  5,000 Units Subcutaneous Q8H  . insulin aspart  0-5 Units Subcutaneous QHS  . insulin aspart  0-9 Units Subcutaneous TID WC  . loratadine  10 mg Oral Daily  . pantoprazole (PROTONIX) IV  40 mg Intravenous Q24H  . simvastatin  20 mg Oral Daily   Continuous Infusions: . sodium chloride 50 mL/hr at 09/22/19 1246     LOS: 0 days    Time spent: 30 minutes.   Barton Dubois, MD [facility] Pager [phone]   09/22/2019, 4:35 PM

## 2019-09-22 NOTE — Progress Notes (Signed)
  Echocardiogram 2D Echocardiogram has been performed.  Pieter Partridge 09/22/2019, 2:16 PM

## 2019-09-22 NOTE — Care Management Obs Status (Signed)
MEDICARE OBSERVATION STATUS NOTIFICATION   Patient Details  Name: Donald Conley MRN: [id number] Date of Birth: 09/13/1935   Medicare Observation Status Notification Given:  Yes    Corey Harold 09/22/2019, 3:21 PM

## 2019-09-22 NOTE — [place] FL2 (Signed)
[place] MEDICAID FL2 LEVEL OF CARE SCREENING TOOL     IDENTIFICATION  Patient Name: Donald Conley Birthdate: 1935-11-06 Sex: male Admission Date (Current Location): 09/21/2019  [facility] and [place] Number:  [employer] and Address:  [facility],  [address], [place] [address]      Provider Number:    Attending Physician Name and Address:  Vassie Loll, MD  Relative Name and Phone Number:  [name] - [phone]    Current Level of Care: Hospital Recommended Level of Care: Skilled Nursing Facility Prior Approval Number:    Date Approved/Denied:   PASRR Number:    Discharge Plan: SNF    Current Diagnoses: Patient Active Problem List   Diagnosis Date Noted  . Stroke-like symptoms 09/21/2019  . Chronic pulmonary embolism without acute cor pulmonale (HCC) 11/03/2017    Orientation RESPIRATION BLADDER Height & Weight     Self, Time, Situation, Place  Normal Continent Weight: 90.7 kg Height:  5\' 11"  (180.3 cm)  BEHAVIORAL SYMPTOMS/MOOD NEUROLOGICAL BOWEL NUTRITION STATUS      Continent Diet  AMBULATORY STATUS COMMUNICATION OF NEEDS Skin     Verbally Normal                       Personal Care Assistance Level of Assistance  Bathing, Feeding, Dressing           Functional Limitations Info  Sight, Hearing, Speech Sight Info: Adequate Hearing Info: Impaired Speech Info: Adequate    SPECIAL CARE FACTORS FREQUENCY  PT (By licensed PT)     PT Frequency: 5 times a week              Contractures Contractures Info: Not present    Additional Factors Info  Code Status, Allergies Code Status Info: Full Allergies Info: KNDA           Current Medications (09/22/2019):  This is the current hospital active medication list Current Facility-Administered Medications  Medication Dose Route Frequency Provider Last Rate Last Admin  . 0.9 %  sodium chloride infusion   Intravenous Continuous 09/24/2019, MD 50 mL/hr  at 09/22/19 1246 New Bag at 09/22/19 1246  . acetaminophen (TYLENOL) tablet 650 mg  650 mg Oral Q4H PRN 09/24/19, MD       Or  . acetaminophen (TYLENOL) 160 MG/5ML solution 650 mg  650 mg Per Tube Q4H PRN Vassie Loll, MD       Or  . acetaminophen (TYLENOL) suppository 650 mg  650 mg Rectal Q4H PRN Vassie Loll, MD      . ascorbic acid (VITAMIN C) tablet 1,000 mg  1,000 mg Oral Daily Vassie Loll, MD      . cholecalciferol (VITAMIN D3) tablet 1,000 Units  1,000 Units Oral Daily Vassie Loll, MD      . clopidogrel (PLAVIX) tablet 75 mg  75 mg Oral Daily Vassie Loll, MD      . cyanocobalamin ((VITAMIN B-12)) injection 1,000 mcg  1,000 mcg Intramuscular Daily Vassie Loll, MD   1,000 mcg at 09/22/19 1239  . doxazosin (CARDURA) tablet 8 mg  8 mg Oral Daily 09/24/19, MD      . finasteride (PROSCAR) tablet 5 mg  5 mg Oral Daily Vassie Loll, MD      . fluticasone [facility]) 50 MCG/ACT nasal spray 1 spray  1 spray Each Nare Daily [facility], MD   1 spray at 09/22/19 1239  . heparin injection 5,000 Units  5,000  Units Subcutaneous Q8H Barton Dubois, MD   5,000 Units at 09/22/19 1418  . insulin aspart (novoLOG) injection 0-5 Units  0-5 Units Subcutaneous QHS Barton Dubois, MD      . insulin aspart (novoLOG) injection 0-9 Units  0-9 Units Subcutaneous TID WC Barton Dubois, MD   2 Units at 09/22/19 1238  . loratadine (CLARITIN) tablet 10 mg  10 mg Oral Daily Barton Dubois, MD      . pantoprazole (PROTONIX) injection 40 mg  40 mg Intravenous Q24H Barton Dubois, MD   40 mg at 09/22/19 1251  . senna-docusate (Senokot-S) tablet 1 tablet  1 tablet Oral QHS PRN Barton Dubois, MD      . simvastatin (ZOCOR) tablet 20 mg  20 mg Oral Daily Barton Dubois, MD         Discharge Medications: Please see discharge summary for a list of discharge medications.  Relevant Imaging Results:  Relevant Lab Results:   Additional Information SS# [id number]  Boneta Lucks,  RN

## 2019-09-22 NOTE — Progress Notes (Signed)
PT recommendation was for SNF and patient's daughter, Ripley Bogosian, was in agreement. Family's first choice is [facility]. FL2 and therapy notes faxed to [facility], [facility], [facility].  Memory Argue, LCSW Transitions of Care Clinical Social Worker [facility] Ph: [phone]

## 2019-09-22 NOTE — Progress Notes (Signed)
Patient daughter took stroke book home to educate herself on stroke. Encouraged education from stroke book and to ask staff questions if any concern.

## 2019-09-22 NOTE — Plan of Care (Signed)
  Problem: Acute Rehab PT Goals(only PT should resolve) Goal: Pt Will Go Supine/Side To Sit Outcome: Progressing Flowsheets (Taken 09/22/2019 1452) Pt will go Supine/Side to Sit: with minimal assist Goal: Pt Will Go Sit To Supine/Side Outcome: Progressing Flowsheets (Taken 09/22/2019 1452) Pt will go Sit to Supine/Side: with minimal assist Goal: Patient Will Perform Sitting Balance Outcome: Progressing Flowsheets (Taken 09/22/2019 1452) Patient will perform sitting balance:  with minimal assist  with moderate assist   2:53 PM, 09/22/19 Wyman Songster PT, DPT Physical Therapist at [facility]

## 2019-09-22 NOTE — Consult Note (Signed)
[place] A. Merlene Laughter, MD     [email or web address]          Donald Conley is an 84 y.o. male.   ASSESSMENT/PLAN: 1. 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 Given the alternating symptoms in the poor localization of the examination, there was concern for potential severe large vessel occlusive disease particularly involving the vertebrobasilar system. Urgent CTA was therefore arranged. 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 The patient will need continuing evaluation from swallowing speech. He will also need physical and occupational therapies. The patient should be treated with dual antiplatelet agents for at least 3 months. Subsequently, some agents should be sufficient. He also should be on a statin medication.  2. Vitamin B12 deficiency: This will be replaced.   The patient awoke about 30 hours ago in the morning with numbness involving the left facial region. Reports that he just did not feel right. He reports that he then went on to have numbness on the right side. This is the right facial region. He does not report having numbness or tingling weakness involving the upper lower extremities. His daughter noticed that he was having significant difficulty swallowing and talking decided to seek help for the patient. It appears that he has been quite ataxic especially in the hospital. She reports them in it was right. It appears that he also had facial asymmetry with what appears to be weakness of the right facial region during the hospital. He also has developed vertical diplopia in the hospital. He has had some headaches but this apparently is been present for about 2 weeks or more. He does not report any significant shortness of breath or chest pain. Again, no weakness of upper lower extremities.  Balance is been quite severe the last day. The review systems otherwise negative.  GENERAL: This is a pleasant male who appears to be in some discomfort from frequent coughing and nausea with some dry heaving at times.  HEENT: Neck is supple no trauma is appreciated.  ABDOMEN: Soft  EXTREMITIES: No edema; there is large incisional scar from knee replacement bilaterally. There is associated marked arthritic changes. The toes are chronically upgoing bilaterally that is the great toes.   BACK: Normal alignment.  SKIN: Normal by inspection.    MENTAL STATUS: Alert and oriented - including orientation to his age and the month. Speech moderate dysarthria is noted, language and cognition are generally intact. Judgment and insight normal.   CRANIAL NERVES: Pupils are equal, round and reactive to light and accommodation; the eyes are noted to have nystagmus in primary position. This is mostly a torsional own nystagmus most prominent involving the left eye. There is normal upgaze but downgaze is limited with the eyes deviating to the right. There is also limited movements of the left eye to the left ongoing over about 80%. There is reduced sensation involving the right facial region. The upper and lower facial muscles are normal in strength and symmetric, there is no flattening of the nasolabial folds; tongue is midline; uvula is midline; shoulder elevation is normal.  MOTOR: Normal tone, bulk and strength; no pronator drift.  COORDINATION: Left finger to nose is normal, right finger to nose is normal, No rest tremor; no intention tremor; no postural tremor; no bradykinesia.  REFLEXES: Deep tendon reflexes are symmetrical and  normal. Plantar responses are flexor bilaterally.   SENSATION: Again reduced sensation right facial region.  NIH STROKE SCALE 2, 1 TOTAL 3 The brain MRI scan is reviewed in person. No acute changes are noted on DWI. 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 No areas of encephalomalacia or hemorrhage are appreciated.    Blood pressure (!) 166/76, pulse 63, temperature 98.3 F (36.8 C), resp. rate 19, height 5\' 11"  (1.803 m), weight 90.7 kg, SpO2 94 %.  Past Medical History:  Diagnosis Date  . Allergy   . Arthritis   . Cataract   . GERD (gastroesophageal reflux disease)     Past Surgical History:  Procedure Laterality Date  . REPLACEMENT TOTAL KNEE BILATERAL    . TOTAL HIP ARTHROPLASTY Left   . WRIST ARTHROPLASTY Right     Family History  Problem Relation Age of Onset  . Cancer Father     Social History:  reports that he has quit smoking. He has a 24.00 pack-year smoking history. He has never used smokeless tobacco. He reports that he does not drink alcohol or use drugs.  Allergies: No Known Allergies  Medications: Prior to Admission medications   Medication Sig Start Date End Date Taking? Authorizing Provider  acetaminophen (TYLENOL) 650 MG CR tablet Take 650 mg by mouth as needed.   Yes [provider]  Ascorbic Acid (VITAMIN C) 1000 MG tablet Take 1,000 mg by mouth daily.   Yes [provider]  Calcium Carb-Cholecalciferol (CALCIUM 1000 + D PO) Take 1 tablet by mouth daily.   Yes [provider]  Cholecalciferol (VITAMIN D-1000 MAX ST) 1000 units tablet Take 1,000 Units by mouth daily.   Yes [provider]  clopidogrel (PLAVIX) 75 MG tablet Take 75 mg by mouth daily.   Yes [provider]  doxazosin (CARDURA) 8 MG tablet Take 8 mg by mouth daily. 04/12/17  Yes [provider]  fexofenadine (ALLEGRA) 180 MG tablet Take 180 mg by mouth daily. 07/19/17  Yes [provider]  finasteride (PROSCAR) 5 MG tablet Take 5 mg by mouth daily. 04/12/17  Yes [provider]  fluticasone (FLONASE) 50 MCG/ACT nasal spray Place 1 spray into both nostrils daily.   Yes [provider]  metFORMIN (GLUCOPHAGE)  500 MG tablet Take 500 mg by mouth 2 (two) times daily with a meal.   Yes [provider]  Omega-3 Fatty Acids (KP FISH OIL) 1200 MG CAPS Take 1,200 mg by mouth daily.   Yes [provider]  omeprazole (PRILOSEC) 20 MG capsule Take 20 mg by mouth daily. 04/12/17  Yes [provider]  psyllium (METAMUCIL) 58.6 % powder Take 1 packet by mouth daily.   Yes [provider]  simvastatin (ZOCOR) 20 MG tablet Take 20 mg by mouth daily. 04/12/17  Yes [provider]    Scheduled Meds: . vitamin C  1,000 mg Oral Daily  . cholecalciferol  1,000 Units Oral Daily  . clopidogrel  75 mg Oral Daily  . cyanocobalamin  1,000 mcg Intramuscular Daily  . doxazosin  8 mg Oral Daily  . finasteride  5 mg Oral Daily  . fluticasone  1 spray Each Nare Daily  . heparin injection (subcutaneous)  5,000 Units Subcutaneous Q8H  . insulin aspart  0-5 Units Subcutaneous QHS  . insulin aspart  0-9 Units Subcutaneous TID WC  . loratadine  10 mg Oral Daily  . pantoprazole (PROTONIX) IV  40 mg Intravenous Q24H  . simvastatin  20 mg Oral Daily   Continuous Infusions: . sodium chloride 50 mL/hr at 09/22/19 1246   PRN Meds:.acetaminophen **OR** acetaminophen (TYLENOL) oral liquid 160 mg/5 mL **OR** acetaminophen, senna-docusate     Results for orders placed or performed during the hospital encounter of 09/21/19 (from the past 48 hour(s))  Comprehensive metabolic panel     Status: Abnormal   Collection Time: 09/21/19 10:27 AM  Result Value Ref Range   Sodium 138 135 - 145 mmol/L   Potassium 3.7 3.5 - 5.1 mmol/L   Chloride 99 98 - 111 mmol/L   CO2 26 22 - 32 mmol/L   Glucose, Bld 218 (H) 70 - 99 mg/dL    Comment: Glucose reference range applies only to samples taken after fasting for at least 8 hours.   BUN 11 8 - 23 mg/dL   Creatinine, Ser 1.61 0.61 - 1.24 mg/dL   Calcium 9.2 8.9 - 09.6 mg/dL   Total Protein 7.4 6.5 - 8.1 g/dL   Albumin 4.1 3.5 - 5.0 g/dL   AST 19 15 -  41 U/L   ALT 19 0 - 44 U/L   Alkaline Phosphatase 46 38 - 126 U/L   Total Bilirubin 1.1 0.3 - 1.2 mg/dL   GFR calc non Af Amer >60 >60 mL/min   GFR calc Af Amer >60 >60 mL/min   Anion gap 13 5 - 15    Comment: Performed at [facility], [address], [place], [place] [address]  Lipase, blood     Status: None   Collection Time: 09/21/19 10:27 AM  Result Value Ref Range   Lipase 22 11 - 51 U/L    Comment: Performed at [facility], [address] [place], [place] [address]  CBC with Differential     Status: Abnormal   Collection Time: 09/21/19 10:27 AM  Result Value Ref Range   WBC 9.8 4.0 - 10.5 K/uL   RBC 5.12 4.22 - 5.81 MIL/uL   Hemoglobin 15.3 13.0 - 17.0 g/dL   HCT 14.7 82.9 - 56.2 %   MCV 88.9 80.0 - 100.0 fL   MCH 29.9 26.0 - 34.0 pg   MCHC 33.6 30.0 - 36.0 g/dL   RDW 13.0 86.5 - 78.4 %   Platelets 257 150 - 400 K/uL   nRBC 0.0 0.0 - 0.2 %   Neutrophils Relative % 81 %   Neutro Abs 8.0 (H) 1.7 - 7.7 K/uL   Lymphocytes Relative 11 %   Lymphs Abs 1.1 0.7 - 4.0 K/uL   Monocytes Relative 6 %   Monocytes Absolute 0.6 0.1 - 1.0 K/uL   Eosinophils Relative 1 %   Eosinophils Absolute 0.1 0.0 - 0.5 K/uL   Basophils Relative 0 %   Basophils Absolute 0.0 0.0 - 0.1 K/uL   Immature Granulocytes 1 %   Abs Immature Granulocytes 0.05 0.00 - 0.07 K/uL    Comment: Performed at [facility], [address] [place], [place] [address]  TSH     Status: None   Collection Time: 09/21/19 10:27 AM  Result Value Ref Range   TSH 0.650 0.350 - 4.500 uIU/mL    Comment: Performed by a 3rd Generation assay with a functional sensitivity of <=0.01 uIU/mL. Performed at [facility], [address] [place], [place] [address]   Vitamin B12     Status: None   Collection Time: 09/21/19 10:27 AM  Result Value Ref Range   Vitamin B-12 212 180 -  914 pg/mL    Comment: (NOTE) This assay is not validated for testing neonatal or myeloproliferative syndrome specimens for Vitamin B12 levels. Performed at  [facility], [address] [place], [place] [address]   Respiratory Panel by RT PCR (Flu A&B, Covid) - Nasopharyngeal Swab     Status: None   Collection Time: 09/21/19 10:53 AM   Specimen: Nasopharyngeal Swab  Result Value Ref Range   SARS Coronavirus 2 by RT PCR NEGATIVE NEGATIVE    Comment: (NOTE) SARS-CoV-2 target nucleic acids are NOT DETECTED. The SARS-CoV-2 RNA is generally detectable in upper respiratoy specimens during the acute phase of infection. The lowest concentration of SARS-CoV-2 viral copies this assay can detect is 131 copies/mL. A negative result does not preclude SARS-Cov-2 infection and should not be used as the sole basis for treatment or other patient management decisions. A negative result may occur with  improper specimen collection/handling, submission of specimen other than nasopharyngeal swab, presence of viral mutation(s) within the areas targeted by this assay, and inadequate number of viral copies (<131 copies/mL). A negative result must be combined with clinical observations, patient history, and epidemiological information. The expected result is Negative. Fact Sheet for Patients:  https://www.moore.com/ Fact Sheet for Healthcare Providers:  https://www.young.biz/ This test is not yet ap proved or cleared by the [place] FDA and  has been authorized for detection and/or diagnosis of SARS-CoV-2 by FDA under an Emergency Use Authorization (EUA). This EUA will remain  in effect (meaning this test can be used) for the duration of the COVID-19 declaration under Section 564(b)(1) of the Act, 21 U.S.C. section 360bbb-3(b)(1), unless the authorization is terminated or revoked sooner.    Influenza A by PCR NEGATIVE NEGATIVE   Influenza B by PCR NEGATIVE NEGATIVE    Comment: (NOTE) The Xpert Xpress SARS-CoV-2/FLU/RSV assay is intended as an aid in  the diagnosis of influenza from Nasopharyngeal swab specimens and    should not be used as a sole basis for treatment. Nasal washings and  aspirates are unacceptable for Xpert Xpress SARS-CoV-2/FLU/RSV  testing. Fact Sheet for Patients: https://www.moore.com/ Fact Sheet for Healthcare Providers: https://www.young.biz/ This test is not yet approved or cleared by the [place] FDA and  has been authorized for detection and/or diagnosis of SARS-CoV-2 by  FDA under an Emergency Use Authorization (EUA). This EUA will remain  in effect (meaning this test can be used) for the duration of the  Covid-19 declaration under Section 564(b)(1) of the Act, 21  U.S.C. section 360bbb-3(b)(1), unless the authorization is  terminated or revoked. Performed at [facility], [address] [place], [place] [address]   Glucose, capillary     Status: Abnormal   Collection Time: 09/21/19  8:56 PM  Result Value Ref Range   Glucose-Capillary 148 (H) 70 - 99 mg/dL    Comment: Glucose reference range applies only to samples taken after fasting for at least 8 hours.  Hemoglobin A1c     Status: Abnormal   Collection Time: 09/22/19  4:55 AM  Result Value Ref Range   Hgb A1c MFr Bld 6.6 (H) 4.8 - 5.6 %    Comment: (NOTE) Pre diabetes:          5.7%-6.4% Diabetes:              >6.4% Glycemic control for   <7.0% adults with diabetes    Mean Plasma Glucose 142.72 mg/dL    Comment: Performed at [facility], [address], [place], [place]  [address]  Lipid panel     Status: Abnormal   Collection Time: 09/22/19  4:55 AM  Result Value Ref Range   Cholesterol 168 0 - 200 mg/dL   Triglycerides 517 <001 mg/dL   HDL 36 (L) >74 mg/dL   Total CHOL/HDL Ratio 4.7 RATIO   VLDL 22 0 - 40 mg/dL   LDL Cholesterol 944 (H) 0 - 99 mg/dL    Comment:        Total Cholesterol/HDL:CHD Risk Coronary Heart Disease Risk Table                     Men   Women  1/2 Average Risk   3.4   3.3  Average Risk       5.0   4.4  2 X Average Risk   9.6    7.1  3 X Average Risk  23.4   11.0        Use the calculated Patient Ratio above and the CHD Risk Table to determine the patient's CHD Risk.        ATP III CLASSIFICATION (LDL):  <100     mg/dL   Optimal  967-591  mg/dL   Near or Above                    Optimal  130-159  mg/dL   Borderline  638-466  mg/dL   High  >599     mg/dL   Very High Performed at [facility], [address] [place], [place] [address]   CBC     Status: Abnormal   Collection Time: 09/22/19  4:55 AM  Result Value Ref Range   WBC 11.0 (H) 4.0 - 10.5 K/uL   RBC 4.67 4.22 - 5.81 MIL/uL   Hemoglobin 14.1 13.0 - 17.0 g/dL   HCT 77.9 39.0 - 30.0 %   MCV 88.9 80.0 - 100.0 fL   MCH 30.2 26.0 - 34.0 pg   MCHC 34.0 30.0 - 36.0 g/dL   RDW 92.3 30.0 - 76.2 %   Platelets 263 150 - 400 K/uL   nRBC 0.0 0.0 - 0.2 %    Comment: Performed at [facility], [address], [place], [place] [address]  Basic metabolic panel     Status: Abnormal   Collection Time: 09/22/19  4:55 AM  Result Value Ref Range   Sodium 140 135 - 145 mmol/L   Potassium 3.4 (L) 3.5 - 5.1 mmol/L   Chloride 104 98 - 111 mmol/L   CO2 26 22 - 32 mmol/L   Glucose, Bld 122 (H) 70 - 99 mg/dL    Comment: Glucose reference range applies only to samples taken after fasting for at least 8 hours.   BUN 10 8 - 23 mg/dL   Creatinine, Ser 5.45 0.61 - 1.24 mg/dL   Calcium 8.7 (L) 8.9 - 10.3 mg/dL   GFR calc non Af Amer >60 >60 mL/min   GFR calc Af Amer >60 >60 mL/min   Anion gap 10 5 - 15    Comment: Performed at [facility], [address], [place], [place] [address]  Glucose, capillary     Status: Abnormal   Collection Time: 09/22/19  7:52 AM  Result Value Ref Range   Glucose-Capillary 141 (H) 70 - 99 mg/dL    Comment: Glucose reference range applies only to samples taken after fasting for at least 8 hours.  Glucose, capillary     Status: Abnormal   Collection  Time: 09/22/19 12:08 PM  Result Value Ref Range   Glucose-Capillary 151 (H) 70 - 99 mg/dL     Comment: Glucose reference range applies only to samples taken after fasting for at least 8 hours.  Glucose, capillary     Status: Abnormal   Collection Time: 09/22/19  5:14 PM  Result Value Ref Range   Glucose-Capillary 117 (H) 70 - 99 mg/dL    Comment: Glucose reference range applies only to samples taken after fasting for at least 8 hours.   Comment 1 Notify RN    Comment 2 Document in Chart     Studies/Results:  BRAIN MRI FINDINGS: Motion artifact is present.  Brain: There is no acute infarction or intracranial hemorrhage. There is no intracranial mass, mass effect, or edema. There is no hydrocephalus or extra-axial fluid collection. Confluent areas of T2 hyperintensity in the supratentorial white matter are nonspecific probably reflect moderate chronic microvascular ischemic changes. 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 Small chronic cerebellar infarcts are again identified. Prominence of the ventricles and sulci reflects stable parenchymal volume loss.  Vascular: Loss of the expected flow void of the intracranial proximal to mid right vertebral artery is again noted. This may reflect chronic occlusion or slow flow. Major vessel flow voids at the skull base are otherwise preserved.  Skull and upper cervical spine: Normal marrow signal is preserved.  Sinuses/Orbits: Mild mucosal thickening.  Orbits are unremarkable.  Other: Sella is unremarkable.  Mastoid air cells are clear.  IMPRESSION: No evidence of recent infarction, hemorrhage, or mass. Stable chronic findings detailed above.   CAROTID DOPPLERS IMPRESSION: Color duplex indicates minimal heterogeneous and calcified plaque, with no hemodynamically significant stenosis by duplex criteria in the extracranial cerebrovascular circulation.      BRAIN MRI 03-2019 FINDINGS: Brain: Diffusion imaging does not show any acute or subacute infarction. Chronic small-vessel ischemic changes affect the  pons. There are old small vessel cerebellar infarctions. There is an old left occipital cortical and subcortical infarction. There chronic small-vessel changes of the thalami and basal ganglia. Extensive chronic small-vessel ischemic changes affect the cerebral hemispheric white matter. No cortical or large vessel territory infarction otherwise. No mass lesion, hemorrhage, hydrocephalus or extra-axial collection.  Vascular: Major vessels at the base of the brain show flow.  Skull and upper cervical spine: Negative  Sinuses/Orbits: Clear/normal  Other: None  IMPRESSION: No acute, subacute or reversible finding. Extensive chronic small-vessel ischemic changes throughout the brain as outlined above. 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     Danette Weinfeld A. Gerilyn Pilgrimoonquah, M.D.  Diplomate, Biomedical engineerAmerican Board of Psychiatry and Neurology ( Neurology). 09/22/2019, 5:44 PM

## 2019-09-23 DIAGNOSIS — E872 Acidosis: Secondary | ICD-10-CM | POA: Diagnosis not present

## 2019-09-23 DIAGNOSIS — K219 Gastro-esophageal reflux disease without esophagitis: Secondary | ICD-10-CM | POA: Diagnosis present

## 2019-09-23 DIAGNOSIS — I9581 Postprocedural hypotension: Secondary | ICD-10-CM | POA: Diagnosis not present

## 2019-09-23 DIAGNOSIS — E785 Hyperlipidemia, unspecified: Secondary | ICD-10-CM | POA: Diagnosis present

## 2019-09-23 DIAGNOSIS — E869 Volume depletion, unspecified: Secondary | ICD-10-CM | POA: Diagnosis not present

## 2019-09-23 DIAGNOSIS — J690 Pneumonitis due to inhalation of food and vomit: Secondary | ICD-10-CM | POA: Diagnosis not present

## 2019-09-23 DIAGNOSIS — Z87891 Personal history of nicotine dependence: Secondary | ICD-10-CM | POA: Diagnosis not present

## 2019-09-23 DIAGNOSIS — Z7189 Other specified counseling: Secondary | ICD-10-CM | POA: Diagnosis not present

## 2019-09-23 DIAGNOSIS — R29810 Facial weakness: Secondary | ICD-10-CM | POA: Diagnosis present

## 2019-09-23 DIAGNOSIS — I10 Essential (primary) hypertension: Secondary | ICD-10-CM | POA: Diagnosis present

## 2019-09-23 DIAGNOSIS — R29703 NIHSS score 3: Secondary | ICD-10-CM | POA: Diagnosis present

## 2019-09-23 DIAGNOSIS — I639 Cerebral infarction, unspecified: Secondary | ICD-10-CM | POA: Diagnosis present

## 2019-09-23 DIAGNOSIS — Z66 Do not resuscitate: Secondary | ICD-10-CM | POA: Diagnosis not present

## 2019-09-23 DIAGNOSIS — M1990 Unspecified osteoarthritis, unspecified site: Secondary | ICD-10-CM | POA: Diagnosis present

## 2019-09-23 DIAGNOSIS — R1312 Dysphagia, oropharyngeal phase: Secondary | ICD-10-CM | POA: Diagnosis not present

## 2019-09-23 DIAGNOSIS — N400 Enlarged prostate without lower urinary tract symptoms: Secondary | ICD-10-CM | POA: Diagnosis present

## 2019-09-23 DIAGNOSIS — Z20822 Contact with and (suspected) exposure to covid-19: Secondary | ICD-10-CM | POA: Diagnosis present

## 2019-09-23 DIAGNOSIS — H269 Unspecified cataract: Secondary | ICD-10-CM | POA: Diagnosis present

## 2019-09-23 DIAGNOSIS — I469 Cardiac arrest, cause unspecified: Secondary | ICD-10-CM | POA: Diagnosis not present

## 2019-09-23 DIAGNOSIS — R4781 Slurred speech: Secondary | ICD-10-CM | POA: Diagnosis present

## 2019-09-23 DIAGNOSIS — Z96653 Presence of artificial knee joint, bilateral: Secondary | ICD-10-CM | POA: Diagnosis present

## 2019-09-23 DIAGNOSIS — R0902 Hypoxemia: Secondary | ICD-10-CM | POA: Diagnosis not present

## 2019-09-23 DIAGNOSIS — G9341 Metabolic encephalopathy: Secondary | ICD-10-CM | POA: Diagnosis not present

## 2019-09-23 DIAGNOSIS — E1151 Type 2 diabetes mellitus with diabetic peripheral angiopathy without gangrene: Secondary | ICD-10-CM | POA: Diagnosis present

## 2019-09-23 DIAGNOSIS — Z515 Encounter for palliative care: Secondary | ICD-10-CM | POA: Diagnosis not present

## 2019-09-23 DIAGNOSIS — R1310 Dysphagia, unspecified: Secondary | ICD-10-CM | POA: Diagnosis not present

## 2019-09-23 DIAGNOSIS — J9601 Acute respiratory failure with hypoxia: Secondary | ICD-10-CM | POA: Diagnosis not present

## 2019-09-23 DIAGNOSIS — Z96642 Presence of left artificial hip joint: Secondary | ICD-10-CM | POA: Diagnosis present

## 2019-09-23 LAB — GLUCOSE, CAPILLARY
Glucose-Capillary: 115 mg/dL — ABNORMAL HIGH (ref 70–99)
Glucose-Capillary: 119 mg/dL — ABNORMAL HIGH (ref 70–99)
Glucose-Capillary: 108 mg/dL — ABNORMAL HIGH (ref 70–99)
Glucose-Capillary: 84 mg/dL (ref 70–99)
Glucose-Capillary: 91 mg/dL (ref 70–99)

## 2019-09-23 MED ORDER — METOPROLOL TARTRATE 5 MG/5ML IV SOLN: 5.0000 mg | Freq: Once | INTRAVENOUS | Status: DC

## 2019-09-23 NOTE — Progress Notes (Signed)
PROGRESS NOTE    Donald Conley  ZDG:[id number] DOB: [birth year] DOA: 09/21/2019 PCP: Lauro Regulus, MD     Brief Narrative:  84 y.o. 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  Patient woke up earlier not noticing any significant abnormality, he used the bathroom and then went to bed again; after waking up and trying to eat breakfast noticing difficulty swallowing and at the moment was noted by family members difficulty on his speech and left facial droop.  Patient was brought to the hospital for further evaluation and management. Of note, patient reported while attempting to eat experiencing some nausea and vomiting after having difficulty passing the food from the back of his throat; it was just the content of the food that he was trying to wait seen in the vomitus.  Patient also expressed having some left-sided weakness. No fever, no chest pain, no palpitation, no dysuria, no hematuria, no cough, no shortness of breath, no melena, no hematochezia or any other complaints.  In the ED work-up demonstrated CT scan of the head negative for acute intracranial normalities, no signs of acute infection on her chest x-ray; UA without signs of infection, normal electrolytes, renal function WBCs and hemoglobin.  No EKGs abnormalities.  Neurology was consulted which recommended admission for TIA/CVA work-up.  TRH was consulted to facilitate further evaluation and management   Assessment & Plan: 1-TIA/brainstem stroke -Continue to have difficulty swallowing, right facial droop poor balance slurred speech -Concerns for brainstem stroke non visible on  MRI. -appreciate neurology assistance and recommendations. -Borderline B12 deficiency appreciated and repletion will be started -Continue to keep n.p.o. and provide IV medications as needed -Speech therapy to reevaluate with possible MBS when appropriate. -Physical therapy recommending skilled nursing facility for rehabilitation -No acute stroke appreciated on CT, MRI it and without significant abnormalities on carotid Dopplers or 2D echo. -TSH within normal limits. -Plan is for dual antiplatelet therapy using full dose aspirin and Plavix; subsequently plan is for him to pursued 325 mg of aspirin on daily basis.  2-essential hypertension -Blood pressure has remained stable -Continue to allow permissive hypertension while acutely ruling out concerns for ischemia process.  3-BPH -Plan is to continue Proscar and Cardura unable to tolerate p.o.'s.  4-hyperlipidemia -Will resume Zocor when able to take by mouth. -LDL 110.  5-type 2 diabetes mellitus (chronically not on insulin therapy) -Continue holding oral hypoglycemic agents currently -A1c 6.6 demonstrating good control -Continue sliding scale insulin with close CBG monitoring while NPO.  6-GERD -Continue PPI; while unable to take p.o.'s will be given IV.  7-allergy rhinitis -Continue Flonase -Unable to tolerate by mouth will resume the use of loratadine.   DVT prophylaxis: Heparin Code Status: Full code. Family Communication: Daughter at bedside. Disposition Plan: Remains in the hospital; follow neurology service recommendations.  Per neurology patient with acute brainstem stroke (nonvisible on MRI).-Right pectoral area stenosis appreciated on CTA.  Continue dual antiplatelet therapy with aspirin and Plavix for 12-month; subsequently most likely full dose aspirin.  Patient continued to be weak, demonstrating difficulty on his speech and swallowing ability.  Will require skilled nursing facility at discharge for rehabilitation and  conditioning. Continue to work with [facility].  Consultants:   Neurology service  Procedures:   See below for x-ray reports  Carotid Dopplers: Indicating minimal heterogeneous and calcified plaque, no hemodynamically significant stenosis by Doppler criteria in the extracranial cerebrovascular circulation.  Vertebral arteries with antegrade flow appreciated.  2D echo: 1. Left ventricular ejection fraction, by estimation, is 60 to 65%. The  left ventricle has normal function. The left ventricle has no regional  wall motion abnormalities. There is mild left ventricular hypertrophy of  the posterior and basal-septal  segments. Left ventricular diastolic parameters are consistent with Grade  I diastolic dysfunction (impaired relaxation).  2. Right ventricular systolic function is normal. The right ventricular  size is normal. There is mildly elevated pulmonary artery systolic  pressure.  3. The mitral valve is degenerative. No evidence of mitral valve  regurgitation.  4. The aortic valve is tricuspid. Aortic valve regurgitation is not  visualized. No aortic stenosis is present.   Antimicrobials:  Anti-infectives (From admission, onward)   None      Subjective: Feeling better today; reporting improvement in facial droop, swallowing speech.  Still pending evaluation by [facility] today.  No fever, no chest pain, no nausea, no vomiting.  Positive postural poor balance.  Objective: Vitals:   09/23/19 0620 09/23/19 0826 09/23/19 1020 09/23/19 1420  BP: (!) 155/85  (!) 184/85 (!) 167/77  Pulse: 82  72 64  Resp: Temp: 98.6 F (37 C)  98.2 F (36.8 C) 97.6 F (36.4 C)  TempSrc: Oral  Oral Oral  SpO2: 93% 98% 97% 99%  Weight:      Height:        Intake/Output Summary (Last 24 hours) at 09/23/2019 1544 Last data filed at 09/23/2019 0730 Gross per 24 hour  Intake 1049.9 ml  Output 400 ml  Net 649.9 ml   Filed Weights   09/21/19 1009  Weight: 90.7 kg     Examination: General exam: Alert, awake, oriented x 2, reporting improvement in his dysphagia, speech and overall mentation.  Answering questions appropriately and following commands without difficulties.  Still feeling weak.  No fever, no chest pain, no nausea, no vomiting.  Daughter at bedside reported noticing her father looks better today. Respiratory system: Clear to auscultation. Respiratory effort normal. Cardiovascular system:RRR. No murmurs, rubs, gallops. Gastrointestinal system: Abdomen is nondistended, soft and nontender. No organomegaly or masses felt. Normal bowel sounds heard. Central nervous system: Alert and oriented.  Still with some difficulty finding words, mild slurred speech right facial droop (even much improved on 09/22/2019).  Continues to have postural poor balance. Extremities: No cyanosis or clubbing. Skin: No rashes, no petechiae. Psychiatry: Mood & affect appropriate.    Data Reviewed: I have personally reviewed following labs and imaging studies  CBC: Recent Labs  Lab 09/21/19 1027 09/22/19 0455  WBC 9.8 11.0*  NEUTROABS 8.0*  --   HGB 15.3 14.1  HCT 45.5 41.5  MCV 88.9 88.9  PLT 257 263   Basic Metabolic Panel: Recent Labs  Lab 09/21/19 1027 09/22/19 0455  NA 138 140  K 3.7 3.4*  CL 99 104  CO2 26 26  GLUCOSE 218* 122*  BUN 11 10  CREATININE 0.79 0.61  CALCIUM 9.2 8.7*   GFR: Estimated Creatinine Clearance: 82.1 mL/min (by C-G formula based on SCr of 0.61 mg/dL).   Liver Function Tests: Recent Labs  Lab 09/21/19 1027  AST 19  ALT 19  ALKPHOS 46  BILITOT 1.1  PROT 7.4  ALBUMIN 4.1   Recent Labs  Lab 09/21/19 1027  LIPASE 22   HbA1C: Recent Labs    09/22/19 0455  HGBA1C 6.6*   CBG: Recent Labs  Lab 09/22/19 1714 09/22/19 2030 09/23/19 0418 09/23/19 0740 09/23/19 1121  GLUCAP 117* 109* 115* 119* 108*   Lipid Profile: Recent Labs    09/22/19 0455  CHOL 168  HDL 36*  LDLCALC 110*  TRIG 110  CHOLHDL 4.7    Thyroid Function Tests: Recent Labs    09/21/19 1027  TSH 0.650   Anemia Panel: Recent Labs    09/21/19 1027  VITAMINB12 212   Urine analysis:    Component Value Date/Time   COLORURINE YELLOW 03/30/2019 0916   APPEARANCEUR CLEAR 03/30/2019 0916   APPEARANCEUR Clear 04/10/2013 1155   LABSPEC 1.020 03/30/2019 0916   LABSPEC 1.025 04/10/2013 1155   PHURINE 5.0 03/30/2019 0916   GLUCOSEU 50 (A) 03/30/2019 0916   GLUCOSEU Negative 04/10/2013 1155   HGBUR NEGATIVE 03/30/2019 0916   BILIRUBINUR NEGATIVE 03/30/2019 0916   BILIRUBINUR Negative 04/10/2013 1155   KETONESUR NEGATIVE 03/30/2019 0916   PROTEINUR NEGATIVE 03/30/2019 0916   NITRITE NEGATIVE 03/30/2019 0916   LEUKOCYTESUR NEGATIVE 03/30/2019 0916   LEUKOCYTESUR Negative 04/10/2013 1155    Recent Results (from the past 240 hour(s))  Respiratory Panel by RT PCR (Flu A&B, Covid) - Nasopharyngeal Swab     Status: None   Collection Time: 09/21/19 10:53 AM   Specimen: Nasopharyngeal Swab  Result Value Ref Range Status   SARS Coronavirus 2 by RT PCR NEGATIVE NEGATIVE Final    Comment: (NOTE) SARS-CoV-2 target nucleic acids are NOT DETECTED. The SARS-CoV-2 RNA is generally detectable in upper respiratoy specimens during the acute phase of infection. The lowest concentration of SARS-CoV-2 viral copies this assay can detect is 131 copies/mL. A negative result does not preclude SARS-Cov-2 infection and should not be used as the sole basis for treatment or other patient management decisions. A negative result may occur with  improper specimen collection/handling, submission of specimen other than nasopharyngeal swab, presence of viral mutation(s) within the areas targeted by this assay, and inadequate number of viral copies (<131 copies/mL). A negative result must be combined with clinical observations, patient history, and epidemiological information. The expected result is Negative. Fact Sheet for Patients:   https://www.moore.com/ Fact Sheet for Healthcare Providers:  https://www.young.biz/ This test is not yet ap proved or cleared by the [place] FDA and  has been authorized for detection and/or diagnosis of SARS-CoV-2 by FDA under an Emergency Use Authorization (EUA). This EUA will remain  in effect (meaning this test can be used) for the duration of the COVID-19 declaration under Section 564(b)(1) of the Act, 21 U.S.C. section 360bbb-3(b)(1), unless the authorization is terminated or revoked sooner.    Influenza A by PCR NEGATIVE NEGATIVE Final   Influenza B by PCR NEGATIVE NEGATIVE Final    Comment: (NOTE) The Xpert Xpress SARS-CoV-2/FLU/RSV assay is intended as an aid in  the diagnosis of influenza from Nasopharyngeal swab specimens and  should not be used as a sole basis for treatment. Nasal washings and  aspirates are unacceptable for Xpert Xpress SARS-CoV-2/FLU/RSV  testing. Fact Sheet for Patients: https://www.moore.com/ Fact Sheet for Healthcare Providers: https://www.young.biz/ This test is not yet approved or cleared by the [place] FDA and  has been authorized for detection and/or diagnosis of SARS-CoV-2 by  FDA under an Emergency Use Authorization (EUA). This EUA will remain  in effect (meaning this test can be used) for the duration of the  Covid-19 declaration under  Section 564(b)(1) of the Act, 21  U.S.C. section 360bbb-3(b)(1), unless the authorization is  terminated or revoked. Performed at [facility], [address] [place], [place] [address]       Radiology Studies: CT ANGIO HEAD W OR WO CONTRAST  Result Date: 09/22/2019 CLINICAL DATA:  Ataxia, stroke suspected. Limb drift. Right-sided facial numbness. Right-sided weakness. Abnormal speech. EXAM: CT ANGIOGRAPHY HEAD AND NECK TECHNIQUE: Multidetector CT imaging of the head and neck was performed using the standard protocol  during bolus administration of intravenous contrast. Multiplanar CT image reconstructions and MIPs were obtained to evaluate the vascular anatomy. Carotid stenosis measurements (when applicable) are obtained utilizing NASCET criteria, using the distal internal carotid diameter as the denominator. CONTRAST:  OMNIPAQUE IOHEXOL 350 MG/ML SOLN COMPARISON:  CT head without contrast 09/22/19. FINDINGS: CTA NECK FINDINGS Aortic arch: A 3 vessel arch configuration is present. Atherosclerotic changes are noted within the arch and origins the great vessels without aneurysm focal stenosis. Right carotid system: Atherosclerotic changes are present at the right carotid bifurcation. Minimal luminal diameter is 3.5 mm. Mild tortuosity is present cervical right ICA without other significant stenosis. Left carotid system: The left common carotid artery within normal limits. Atherosclerotic calcifications are present at the left carotid bifurcation proximal left ICA. Minimal luminal diameter is 3 mm. Cervical left ICA is otherwise normal Vertebral arteries: Left vertebral artery is the dominant vessel. Both vertebral arteries originate from the subclavian arteries. High-grade stenosis is present the origin of the right vertebral artery. The right vertebral artery is occluded the V3 segment. Left vertebral artery is intact. Skeleton: Cervical spine is fused C4-7. Grade 1 anterolisthesis is present at C3-4. Alignment is otherwise intact. Vertebral body heights are normal. No focal lytic or blastic lesions are present Other neck: The soft tissues the neck are unremarkable. Upper chest: Lung apices are clear. Thoracic inlet is within normal limits. Review of the MIP images confirms the above findings CTA HEAD FINDINGS Anterior circulation: Atherosclerotic calcifications are present within the cavernous internal carotid arteries bilaterally. No significant stenosis is present through the ICA termini. The A1 and M1 segments are  normal. The anterior communicating artery is patent. MCA bifurcations are intact bilaterally. The ACA and MCA branch vessels are normal. Posterior circulation: Left vertebral artery feeds basilar. The V4 segment is reconstituted into the right AICA. The basilar artery is normal. Both posterior cerebral arteries originate from basilar tip. The PCA branch vessels are within bilaterally. Venous sinuses: The dural sinuses are patent. Straight sinus deep cerebral veins are intact. Cortical veins are unremarkable. No vascular malformations are present. Anatomic variants: None Review of the MIP images confirms the above findings IMPRESSION: 1. 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 2. The left vertebral artery is dominant. 3. The basilar artery is reconstituted into the right AICA. 4. Atherosclerotic changes at the carotid bifurcations bilaterally without significant stenosis. 5. No significant proximal stenosis, aneurysm, or branch vessel occlusion within the Circle of Willis. Electronically Signed   By: Marin Roberts M.D.   On: 09/22/2019 21:54   CT HEAD WO CONTRAST  Result Date: 09/22/2019 CLINICAL DATA:  Slurred speech. Dizziness EXAM: CT HEAD WITHOUT CONTRAST TECHNIQUE: Contiguous axial images were obtained from the base of the skull through the vertex without intravenous contrast. COMPARISON:  September 21, 2019. FINDINGS: Brain: Mild chronic ischemic white matter disease is noted. No mass effect or midline shift is  noted. Ventricular size is within normal limits. There is no evidence of mass lesion, hemorrhage or acute infarction. Vascular: No hyperdense vessel or unexpected calcification. Skull: Normal. Negative for fracture or focal lesion. Sinuses/Orbits: Minimal right sphenoid sinusitis is noted Other: None. IMPRESSION: Mild chronic ischemic white matter disease. Minimal right sphenoid sinusitis. No acute  intracranial abnormality seen.31 Electronically Signed   By: Lupita Raider M.D.   On: 09/22/2019 11:51   CT ANGIO NECK W OR WO CONTRAST  Result Date: 09/22/2019 CLINICAL DATA:  Ataxia, stroke suspected. Limb drift. Right-sided facial numbness. Right-sided weakness. Abnormal speech. EXAM: CT ANGIOGRAPHY HEAD AND NECK TECHNIQUE: Multidetector CT imaging of the head and neck was performed using the standard protocol during bolus administration of intravenous contrast. Multiplanar CT image reconstructions and MIPs were obtained to evaluate the vascular anatomy. Carotid stenosis measurements (when applicable) are obtained utilizing NASCET criteria, using the distal internal carotid diameter as the denominator. CONTRAST:  OMNIPAQUE IOHEXOL 350 MG/ML SOLN COMPARISON:  CT head without contrast 09/22/19. FINDINGS: CTA NECK FINDINGS Aortic arch: A 3 vessel arch configuration is present. Atherosclerotic changes are noted within the arch and origins the great vessels without aneurysm focal stenosis. Right carotid system: Atherosclerotic changes are present at the right carotid bifurcation. Minimal luminal diameter is 3.5 mm. Mild tortuosity is present cervical right ICA without other significant stenosis. Left carotid system: The left common carotid artery within normal limits. Atherosclerotic calcifications are present at the left carotid bifurcation proximal left ICA. Minimal luminal diameter is 3 mm. Cervical left ICA is otherwise normal Vertebral arteries: Left vertebral artery is the dominant vessel. Both vertebral arteries originate from the subclavian arteries. High-grade stenosis is present the origin of the right vertebral artery. The right vertebral artery is occluded the V3 segment. Left vertebral artery is intact. Skeleton: Cervical spine is fused C4-7. Grade 1 anterolisthesis is present at C3-4. Alignment is otherwise intact. Vertebral body heights are normal. No focal lytic or blastic lesions are  present Other neck: The soft tissues the neck are unremarkable. Upper chest: Lung apices are clear. Thoracic inlet is within normal limits. Review of the MIP images confirms the above findings CTA HEAD FINDINGS Anterior circulation: Atherosclerotic calcifications are present within the cavernous internal carotid arteries bilaterally. No significant stenosis is present through the ICA termini. The A1 and M1 segments are normal. The anterior communicating artery is patent. MCA bifurcations are intact bilaterally. The ACA and MCA branch vessels are normal. Posterior circulation: Left vertebral artery feeds basilar. The V4 segment is reconstituted into the right AICA. The basilar artery is normal. Both posterior cerebral arteries originate from basilar tip. The PCA branch vessels are within bilaterally. Venous sinuses: The dural sinuses are patent. Straight sinus deep cerebral veins are intact. Cortical veins are unremarkable. No vascular malformations are present. Anatomic variants: None Review of the MIP images confirms the above findings IMPRESSION: 1. 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 2. The left vertebral artery is dominant. 3. The basilar artery is reconstituted into the right AICA. 4. Atherosclerotic changes at the carotid bifurcations bilaterally without significant stenosis. 5. No significant proximal stenosis, aneurysm, or branch vessel occlusion within the Circle of Willis. Electronically Signed   By: Marin Roberts M.D.   On: 09/22/2019 21:54   MR BRAIN WO CONTRAST  Result Date: 09/21/2019 CLINICAL DATA:  Dizziness, headache EXAM: MRI HEAD WITHOUT CONTRAST TECHNIQUE: Multiplanar, multiecho  pulse sequences of the brain and surrounding structures were obtained without intravenous contrast. COMPARISON:  October 2020 FINDINGS: Motion artifact is present. Brain: There is no acute infarction or  intracranial hemorrhage. There is no intracranial mass, mass effect, or edema. There is no hydrocephalus or extra-axial fluid collection. Confluent areas of T2 hyperintensity in the supratentorial white matter are nonspecific probably reflect moderate chronic microvascular ischemic changes. 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 Small chronic cerebellar infarcts are again identified. Prominence of the ventricles and sulci reflects stable parenchymal volume loss. Vascular: Loss of the expected flow void of the intracranial proximal to mid right vertebral artery is again noted. This may reflect chronic occlusion or slow flow. Major vessel flow voids at the skull base are otherwise preserved. Skull and upper cervical spine: Normal marrow signal is preserved. Sinuses/Orbits: Mild mucosal thickening.  Orbits are unremarkable. Other: Sella is unremarkable.  Mastoid air cells are clear. IMPRESSION: No evidence of recent infarction, hemorrhage, or mass. Stable chronic findings detailed above. Electronically Signed   By: Macy Mis M.D.   On: 09/21/2019 16:59   US Carotid Bilateral (at [facility] and [facility] only)  Result Date: 09/21/2019 CLINICAL DATA:  84 year old male with a history of dizziness EXAM: BILATERAL CAROTID DUPLEX ULTRASOUND TECHNIQUE: Pearline Cables scale imaging, color Doppler and duplex ultrasound were performed of bilateral carotid and vertebral arteries in the neck. COMPARISON:  None. FINDINGS: Criteria: Quantification of carotid stenosis is based on velocity parameters that correlate the residual internal carotid diameter with NASCET-based stenosis levels, using the diameter of the distal internal carotid lumen as the denominator for stenosis measurement. The following velocity measurements were obtained: RIGHT ICA:  Systolic 109 cm/sec, Diastolic 23 cm/sec CCA:  323 cm/sec SYSTOLIC ICA/CCA RATIO:  5.57 ECA:  120 cm/sec LEFT ICA:  Systolic 322 cm/sec, Diastolic 26 cm/sec CCA:  91 cm/sec SYSTOLIC ICA/CCA RATIO:   1.3 ECA:  93 cm/sec Right Brachial SBP: Not acquired Left Brachial SBP: Not acquired RIGHT CAROTID ARTERY: No significant calcifications of the right common carotid artery. Intermediate waveform maintained. Heterogeneous and partially calcified plaque at the right carotid bifurcation. No significant lumen shadowing. Low resistance waveform of the right ICA. Tortuosity RIGHT VERTEBRAL ARTERY: Antegrade flow with low resistance waveform. LEFT CAROTID ARTERY: No significant calcifications of the left common carotid artery. Intermediate waveform maintained. Heterogeneous and partially calcified plaque at the left carotid bifurcation without significant lumen shadowing. Low resistance waveform of the left ICA. Tortuosity LEFT VERTEBRAL ARTERY:  Antegrade flow with low resistance waveform. IMPRESSION: Color duplex indicates minimal heterogeneous and calcified plaque, with no hemodynamically significant stenosis by duplex criteria in the extracranial cerebrovascular circulation. Signed, Dulcy Fanny. Dellia Nims, RPVI [facility] Electronically Signed   By: Corrie Mckusick D.O.   On: 09/21/2019 16:22   ECHOCARDIOGRAM COMPLETE  Result Date: 09/22/2019    ECHOCARDIOGRAM REPORT   Patient Name:   LYONEL MOREJON Date of Exam: 09/22/2019 Medical Rec #:  [id number]      Height:       71.0 in Accession #:    [id number]     Weight:       200.0 lb Date of Birth:  13-Oct-1935      BSA:          2.109 m Patient Age:    42 years       BP:           166/76 mmHg Patient Gender: M  HR:           63 bpm. [facility] Procedure: 2D Echo, Cardiac Doppler and Color Doppler Indications:    CVA  History:        Patient has no prior history of Echocardiogram examinations.                 Stroke and Obesity; Risk Factors:Hypertension, Dyslipidemia and                 Diabetes.  Sonographer:    Lavenia Atlas RDCS Referring Phys: [phone] Monasia Lair  Sonographer  Comments: Patient's position. IMPRESSIONS  1. Left ventricular ejection fraction, by estimation, is 60 to 65%. The left ventricle has normal function. The left ventricle has no regional wall motion abnormalities. There is mild left ventricular hypertrophy of the posterior and basal-septal segments. Left ventricular diastolic parameters are consistent with Grade I diastolic dysfunction (impaired relaxation).  2. Right ventricular systolic function is normal. The right ventricular size is normal. There is mildly elevated pulmonary artery systolic pressure.  3. The mitral valve is degenerative. No evidence of mitral valve regurgitation.  4. The aortic valve is tricuspid. Aortic valve regurgitation is not visualized. No aortic stenosis is present. FINDINGS  Left Ventricle: Left ventricular ejection fraction, by estimation, is 60 to 65%. The left ventricle has normal function. The left ventricle has no regional wall motion abnormalities. The left ventricular internal cavity size was normal in size. There is  mild left ventricular hypertrophy of the posterior and basal-septal segments. Left ventricular diastolic parameters are consistent with Grade I diastolic dysfunction (impaired relaxation). Indeterminate filling pressures. Right Ventricle: The right ventricular size is normal. No increase in right ventricular wall thickness. Right ventricular systolic function is normal. There is mildly elevated pulmonary artery systolic pressure. The tricuspid regurgitant velocity is 2.75  m/s, and with an assumed right atrial pressure of 3 mmHg, the estimated right ventricular systolic pressure is 33.2 mmHg. Left Atrium: Left atrial size was normal in size. Right Atrium: Right atrial size was normal in size. Pericardium: There is no evidence of pericardial effusion. Mitral Valve: The mitral valve is degenerative in appearance. There is mild thickening of the mitral valve leaflet(s). Mild mitral annular calcification. No evidence of  mitral valve regurgitation. Tricuspid Valve: The tricuspid valve is grossly normal. Tricuspid valve regurgitation is mild. Aortic Valve: The aortic valve is tricuspid. . There is mild thickening of the aortic valve. Aortic valve regurgitation is not visualized. No aortic stenosis is present. Mild aortic valve annular calcification. There is mild thickening of the aortic valve. Pulmonic Valve: The pulmonic valve was grossly normal. Pulmonic valve regurgitation is not visualized. Aorta: The aortic root is normal in size and structure. Venous: The inferior vena cava was not well visualized. IAS/Shunts: The interatrial septum was not well visualized.  LEFT VENTRICLE PLAX 2D LVIDd:         4.78 cm  Diastology LVIDs:         2.04 cm  LV e' lateral:   7.94 cm/s LV PW:         1.25 cm  LV E/e' lateral: 10.4 LV IVS:        0.97 cm  LV e' medial:    7.07 cm/s LVOT diam:     1.90 cm  LV E/e' medial:  11.7 LV SV:         90 LV SV Index:   43 LVOT Area:     2.84 cm  RIGHT VENTRICLE RV Basal diam:  3.00 cm RV S prime:     6.31 cm/s TAPSE (M-mode): 2.4 cm LEFT ATRIUM             Index LA diam:        3.10 cm 1.47 cm/m LA Vol (A2C):   61.1 ml 28.98 ml/m LA Vol (A4C):   49.5 ml 23.48 ml/m LA Biplane Vol: 60.7 ml 28.79 ml/m  AORTIC VALVE LVOT Vmax:   135.00 cm/s LVOT Vmean:  86.300 cm/s LVOT VTI:    0.317 m  AORTA Ao Root diam: 3.10 cm MITRAL VALVE               TRICUSPID VALVE MV Area (PHT): 2.59 cm    TR Peak grad:   30.2 mmHg MV Decel Time: 293 msec    TR Vmax:        275.00 cm/s MV E velocity: 82.60 cm/s MV A velocity: 96.80 cm/s  SHUNTS MV E/A ratio:  0.85        Systemic VTI:  0.32 m                            Systemic Diam: 1.90 cm Prentice DockerSuresh Koneswaran MD Electronically signed by Prentice DockerSuresh Koneswaran MD Signature Date/Time: 09/22/2019/2:31:19 PM    Final     Scheduled Meds: . vitamin C  1,000 mg Oral Daily  . aspirin  325 mg Oral Daily  . cholecalciferol  1,000 Units Oral Daily  . clopidogrel  75 mg Oral Daily  .  cyanocobalamin  1,000 mcg Intramuscular Daily  . doxazosin  8 mg Oral Daily  . finasteride  5 mg Oral Daily  . fluticasone  1 spray Each Nare Daily  . heparin injection (subcutaneous)  5,000 Units Subcutaneous Q8H  . insulin aspart  0-5 Units Subcutaneous QHS  . insulin aspart  0-9 Units Subcutaneous TID WC  . loratadine  10 mg Oral Daily  . pantoprazole (PROTONIX) IV  40 mg Intravenous Q24H  . simvastatin  20 mg Oral Daily   Continuous Infusions: . sodium chloride 50 mL/hr at 09/23/19 0955     LOS: 0 days    Time spent: 30 minutes.   Vassie Lollarlos Dorion Petillo, MD [facility] Pager [phone]   09/23/2019, 3:44 PM

## 2019-09-23 NOTE — TOC Progression Note (Addendum)
Transition of Care [facility]) - Progression Note    Patient Details  Name: Donald Conley MRN: [id number] Date of Birth: 12/04/1935  Transition of [facility]) CM/SW Contact  Elliot Gault, LCSW Phone Number: 09/23/2019, 9:44 AM  Clinical Narrative:     TOC following. SNF bed search in progress. Contacted [facility] and they do manage pt's [employer] Medicare. SNF auth started with anticipated start of care for Monday. Ref number is [id number]. Faxed requested clinical. Assigned TOC will need to update Navi on SNF facility selection once it is determined.  TOC will follow.   Expected Discharge Plan: Skilled Nursing Facility Barriers to Discharge: Continued Medical Work up  Expected Discharge Plan and Services Expected Discharge Plan: Skilled Nursing Facility       Living arrangements for the past 2 months: Single Family Home                                       Social Determinants of Health (SDOH) Interventions    Readmission Risk Interventions No flowsheet data found.

## 2019-09-24 LAB — GLUCOSE, CAPILLARY
Glucose-Capillary: 83 mg/dL (ref 70–99)
Glucose-Capillary: 90 mg/dL (ref 70–99)
Glucose-Capillary: 83 mg/dL (ref 70–99)
Glucose-Capillary: 90 mg/dL (ref 70–99)

## 2019-09-24 NOTE — Progress Notes (Signed)
PROGRESS NOTE    Donald Conley  IFO:[id number] DOB: [birth year] DOA: 09/21/2019 PCP: Lauro Regulus, MD     Brief Narrative:  84 y.o. 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  Patient woke up earlier not noticing any significant abnormality, he used the bathroom and then went to bed again; after waking up and trying to eat breakfast noticing difficulty swallowing and at the moment was noted by family members difficulty on his speech and left facial droop.  Patient was brought to the hospital for further evaluation and management. Of note, patient reported while attempting to eat experiencing some nausea and vomiting after having difficulty passing the food from the back of his throat; it was just the content of the food that he was trying to wait seen in the vomitus.  Patient also expressed having some left-sided weakness. No fever, no chest pain, no palpitation, no dysuria, no hematuria, no cough, no shortness of breath, no melena, no hematochezia or any other complaints.  In the ED work-up demonstrated CT scan of the head negative for acute intracranial normalities, no signs of acute infection on her chest x-ray; UA without signs of infection, normal electrolytes, renal function WBCs and hemoglobin.  No EKGs abnormalities.  Neurology was consulted which recommended admission for TIA/CVA work-up.  TRH was consulted to facilitate further evaluation and management   Assessment & Plan: 1-TIA/brainstem stroke -Continue to have difficulty swallowing, right facial droop poor balance slurred speech -Concerns for brainstem stroke non visible on  MRI. -appreciate neurology assistance and recommendations. -Borderline B12 deficiency appreciated and repletion will be started -Continue to keep n.p.o. and provide IV medications as needed -Speech therapy to reevaluate with possible MBS when appropriate; continue to demonstrate pharyngeal dysphagia can be high risk for aspiration.  My end requiring alternate route for nutrition and hydration (like PEG).. -Physical therapy recommending skilled nursing facility for rehabilitation -No acute stroke appreciated on CT, MRI it and without significant abnormalities on carotid Dopplers or 2D echo. -TSH within normal limits. -Plan is for dual antiplatelet therapy using full dose aspirin and Plavix; subsequently plan is for him to pursued 325 mg of aspirin on daily basis.  2-essential hypertension -Blood pressure has remained stable -Continue to allow permissive hypertension while acutely ruling out concerns for ischemia process.  3-BPH -Plan is to continue Proscar and Cardura unable to tolerate p.o.'s.  4-hyperlipidemia -Will resume Zocor when able to take by mouth. -LDL 110.  5-type 2 diabetes mellitus (chronically not on insulin therapy) -Continue holding oral hypoglycemic agents currently -A1c 6.6 demonstrating good control -Continue sliding scale insulin with close CBG monitoring while NPO.  6-GERD -Continue PPI; while unable to take p.o.'s will be given IV.  7-allergy rhinitis -Continue Flonase -Unable to tolerate by mouth will resume the use of loratadine.   DVT prophylaxis: Heparin Code Status: Full code. Family Communication: Daughter at bedside. Disposition Plan: Remains in the hospital; follow neurology service recommendations.  Per neurology patient with acute brainstem stroke (nonvisible on MRI).-Right vertebral stenosis appreciated on CTA.  Continue dual antiplatelet therapy with aspirin and Plavix for 41-month; subsequently most likely full dose aspirin.  Patient continued to  be weak, demonstrating difficulty on his speech and swallowing ability.  Will  require skilled nursing facility at discharge for rehabilitation and conditioning. Continue to work with [facility].  Consultants:   Neurology service  Procedures:   See below for x-ray reports  Carotid Dopplers: Indicating minimal heterogeneous and calcified plaque, no hemodynamically significant stenosis by Doppler criteria in the extracranial cerebrovascular circulation.  Vertebral arteries with antegrade flow appreciated.  2D echo: 1. Left ventricular ejection fraction, by estimation, is 60 to 65%. The  left ventricle has normal function. The left ventricle has no regional  wall motion abnormalities. There is mild left ventricular hypertrophy of  the posterior and basal-septal  segments. Left ventricular diastolic parameters are consistent with Grade  I diastolic dysfunction (impaired relaxation).  2. Right ventricular systolic function is normal. The right ventricular  size is normal. There is mildly elevated pulmonary artery systolic  pressure.  3. The mitral valve is degenerative. No evidence of mitral valve  regurgitation.  4. The aortic valve is tricuspid. Aortic valve regurgitation is not  visualized. No aortic stenosis is present.   Antimicrobials:  Anti-infectives (From admission, onward)   None      Subjective: Patient reports improvement in his balance and is also speaking more clear; continue to have difficulty swallowing and is feeling weak.  Per [facility] continue to be high risk for aspiration (pharyngeal dysphagia).  Objective: Vitals:   09/24/19 0220 09/24/19 0619 09/24/19 1000 09/24/19 1411  BP: (!) 152/87 128/62 (!) 154/80 (!) 182/92  Pulse: 82 97 88 89  Resp: 18 20 20 19   Temp: 98.3 F (36.8 C) 98.8 F (37.1 C) 98.2 F (36.8 C) 97.6 F (36.4 C)  TempSrc: Oral Oral Oral Oral  SpO2: 94% 95% 94% 98%  Weight:      Height:        Intake/Output Summary (Last 24 hours) at 09/24/2019  1620 Last data filed at 09/23/2019 2230 Gross per 24 hour  Intake --  Output 350 ml  Net -350 ml   Filed Weights   09/21/19 1009  Weight: 90.7 kg    Examination: General exam: Alert, awake, oriented x 3, no fever, no chest pain, no nausea or vomiting.  Still having some difficulties with his speech and inability to swallow.  Daughter at bedside reports improvement in his balance. Respiratory system: Clear to auscultation. Respiratory effort normal. Cardiovascular system:RRR. No murmurs, rubs, gallops. Gastrointestinal system: Abdomen is nondistended, soft and nontender. No organomegaly or masses felt. Normal bowel sounds heard. Central nervous system: Alert and oriented. No new focal neurological deficits. Mainly with ongoing dysphagia. Extremities: No C/C/E, +pedal pulses Skin: No rashes, no petechiae.  Psychiatry: Judgement and insight appear normal. Mood & affect appropriate.    Data Reviewed: I have personally reviewed following labs and imaging studies  CBC: Recent Labs  Lab 09/21/19 1027 09/22/19 0455  WBC 9.8 11.0*  NEUTROABS 8.0*  --   HGB 15.3 14.1  HCT 45.5 41.5  MCV 88.9 88.9  PLT 257 263   Basic Metabolic Panel: Recent Labs  Lab 09/21/19 1027 09/22/19 0455  NA 138 140  K 3.7 3.4*  CL 99 104  CO2 26 26  GLUCOSE 218* 122*  BUN 11 10  CREATININE 0.79 0.61  CALCIUM 9.2 8.7*   GFR: Estimated Creatinine Clearance: 82.1 mL/min (by C-G formula based on SCr of 0.61 mg/dL).   Liver Function Tests: Recent Labs  Lab 09/21/19 1027  AST 19  ALT 19  ALKPHOS 46  BILITOT 1.1  PROT 7.4  ALBUMIN 4.1   Recent Labs  Lab  09/21/19 1027  LIPASE 22   HbA1C: Recent Labs    09/22/19 0455  HGBA1C 6.6*   CBG: Recent Labs  Lab 09/23/19 1121 09/23/19 1644 09/23/19 2113 09/24/19 0740 09/24/19 1050  GLUCAP 108* 84 91 83 90   Lipid Profile: Recent Labs    09/22/19 0455  CHOL 168  HDL 36*  LDLCALC 110*  TRIG 110  CHOLHDL 4.7   Urine analysis:     Component Value Date/Time   COLORURINE YELLOW 03/30/2019 0916   APPEARANCEUR CLEAR 03/30/2019 0916   APPEARANCEUR Clear 04/10/2013 1155   LABSPEC 1.020 03/30/2019 0916   LABSPEC 1.025 04/10/2013 1155   PHURINE 5.0 03/30/2019 0916   GLUCOSEU 50 (A) 03/30/2019 0916   GLUCOSEU Negative 04/10/2013 1155   HGBUR NEGATIVE 03/30/2019 0916   BILIRUBINUR NEGATIVE 03/30/2019 0916   BILIRUBINUR Negative 04/10/2013 1155   KETONESUR NEGATIVE 03/30/2019 0916   PROTEINUR NEGATIVE 03/30/2019 0916   NITRITE NEGATIVE 03/30/2019 0916   LEUKOCYTESUR NEGATIVE 03/30/2019 0916   LEUKOCYTESUR Negative 04/10/2013 1155    Recent Results (from the past 240 hour(s))  Respiratory Panel by RT PCR (Flu A&B, Covid) - Nasopharyngeal Swab     Status: None   Collection Time: 09/21/19 10:53 AM   Specimen: Nasopharyngeal Swab  Result Value Ref Range Status   SARS Coronavirus 2 by RT PCR NEGATIVE NEGATIVE Final    Comment: (NOTE) SARS-CoV-2 target nucleic acids are NOT DETECTED. The SARS-CoV-2 RNA is generally detectable in upper respiratoy specimens during the acute phase of infection. The lowest concentration of SARS-CoV-2 viral copies this assay can detect is 131 copies/mL. A negative result does not preclude SARS-Cov-2 infection and should not be used as the sole basis for treatment or other patient management decisions. A negative result may occur with  improper specimen collection/handling, submission of specimen other than nasopharyngeal swab, presence of viral mutation(s) within the areas targeted by this assay, and inadequate number of viral copies (<131 copies/mL). A negative result must be combined with clinical observations, patient history, and epidemiological information. The expected result is Negative. Fact Sheet for Patients:  https://www.moore.com/ Fact Sheet for Healthcare Providers:  https://www.young.biz/ This test is not yet ap proved or cleared by  the [place] FDA and  has been authorized for detection and/or diagnosis of SARS-CoV-2 by FDA under an Emergency Use Authorization (EUA). This EUA will remain  in effect (meaning this test can be used) for the duration of the COVID-19 declaration under Section 564(b)(1) of the Act, 21 U.S.C. section 360bbb-3(b)(1), unless the authorization is terminated or revoked sooner.    Influenza A by PCR NEGATIVE NEGATIVE Final   Influenza B by PCR NEGATIVE NEGATIVE Final    Comment: (NOTE) The Xpert Xpress SARS-CoV-2/FLU/RSV assay is intended as an aid in  the diagnosis of influenza from Nasopharyngeal swab specimens and  should not be used as a sole basis for treatment. Nasal washings and  aspirates are unacceptable for Xpert Xpress SARS-CoV-2/FLU/RSV  testing. Fact Sheet for Patients: https://www.moore.com/ Fact Sheet for Healthcare Providers: https://www.young.biz/ This test is not yet approved or cleared by the [place] FDA and  has been authorized for detection and/or diagnosis of SARS-CoV-2 by  FDA under an Emergency Use Authorization (EUA). This EUA will remain  in effect (meaning this test can be used) for the duration of the  Covid-19 declaration under Section 564(b)(1) of the Act, 21  U.S.C. section 360bbb-3(b)(1), unless the authorization is  terminated or revoked. Performed at [facility], [address]  [place], [place] [address]      Radiology Studies: CT ANGIO HEAD W OR WO CONTRAST  Result Date: 09/22/2019 CLINICAL DATA:  Ataxia, stroke suspected. Limb drift. Right-sided facial numbness. Right-sided weakness. Abnormal speech. EXAM: CT ANGIOGRAPHY HEAD AND NECK TECHNIQUE: Multidetector CT imaging of the head and neck was performed using the standard protocol during bolus administration of intravenous contrast. Multiplanar CT image reconstructions and MIPs were obtained to evaluate the vascular anatomy. Carotid stenosis  measurements (when applicable) are obtained utilizing NASCET criteria, using the distal internal carotid diameter as the denominator. CONTRAST:  131mL OMNIPAQUE IOHEXOL 350 MG/ML SOLN COMPARISON:  CT head without contrast 09/22/19. FINDINGS: CTA NECK FINDINGS Aortic arch: A 3 vessel arch configuration is present. Atherosclerotic changes are noted within the arch and origins the great vessels without aneurysm focal stenosis. Right carotid system: Atherosclerotic changes are present at the right carotid bifurcation. Minimal luminal diameter is 3.5 mm. Mild tortuosity is present cervical right ICA without other significant stenosis. Left carotid system: The left common carotid artery within normal limits. Atherosclerotic calcifications are present at the left carotid bifurcation proximal left ICA. Minimal luminal diameter is 3 mm. Cervical left ICA is otherwise normal Vertebral arteries: Left vertebral artery is the dominant vessel. Both vertebral arteries originate from the subclavian arteries. High-grade stenosis is present the origin of the right vertebral artery. The right vertebral artery is occluded the V3 segment. Left vertebral artery is intact. Skeleton: Cervical spine is fused C4-7. Grade 1 anterolisthesis is present at C3-4. Alignment is otherwise intact. Vertebral body heights are normal. No focal lytic or blastic lesions are present Other neck: The soft tissues the neck are unremarkable. Upper chest: Lung apices are clear. Thoracic inlet is within normal limits. Review of the MIP images confirms the above findings CTA HEAD FINDINGS Anterior circulation: Atherosclerotic calcifications are present within the cavernous internal carotid arteries bilaterally. No significant stenosis is present through the ICA termini. The A1 and M1 segments are normal. The anterior communicating artery is patent. MCA bifurcations are intact bilaterally. The ACA and MCA branch vessels are normal. Posterior circulation: Left  vertebral artery feeds basilar. The V4 segment is reconstituted into the right AICA. The basilar artery is normal. Both posterior cerebral arteries originate from basilar tip. The PCA branch vessels are within bilaterally. Venous sinuses: The dural sinuses are patent. Straight sinus deep cerebral veins are intact. Cortical veins are unremarkable. No vascular malformations are present. Anatomic variants: None Review of the MIP images confirms the above findings IMPRESSION: 1. 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 2. The left vertebral artery is dominant. 3. The basilar artery is reconstituted into the right AICA. 4. Atherosclerotic changes at the carotid bifurcations bilaterally without significant stenosis. 5. No significant proximal stenosis, aneurysm, or branch vessel occlusion within the Circle of Willis. Electronically Signed   By: San Morelle M.D.   On: 09/22/2019 21:54   CT ANGIO NECK W OR WO CONTRAST  Result Date: 09/22/2019 CLINICAL DATA:  Ataxia, stroke suspected. Limb drift. Right-sided facial numbness. Right-sided weakness. Abnormal speech. EXAM: CT ANGIOGRAPHY HEAD AND NECK TECHNIQUE: Multidetector CT imaging of the head and neck was performed using the standard protocol during bolus administration of intravenous contrast. Multiplanar CT image reconstructions and MIPs were obtained to evaluate the vascular anatomy. Carotid stenosis measurements (when applicable) are obtained utilizing NASCET criteria, using the distal internal carotid diameter as the denominator. CONTRAST:  100mL OMNIPAQUE IOHEXOL 350 MG/ML SOLN COMPARISON:  CT head without contrast 09/22/19. FINDINGS: CTA NECK FINDINGS Aortic arch: A 3 vessel arch configuration is present. Atherosclerotic changes are noted within the arch and origins the great vessels without aneurysm focal stenosis. Right carotid system: Atherosclerotic  changes are present at the right carotid bifurcation. Minimal luminal diameter is 3.5 mm. Mild tortuosity is present cervical right ICA without other significant stenosis. Left carotid system: The left common carotid artery within normal limits. Atherosclerotic calcifications are present at the left carotid bifurcation proximal left ICA. Minimal luminal diameter is 3 mm. Cervical left ICA is otherwise normal Vertebral arteries: Left vertebral artery is the dominant vessel. Both vertebral arteries originate from the subclavian arteries. High-grade stenosis is present the origin of the right vertebral artery. The right vertebral artery is occluded the V3 segment. Left vertebral artery is intact. Skeleton: Cervical spine is fused C4-7. Grade 1 anterolisthesis is present at C3-4. Alignment is otherwise intact. Vertebral body heights are normal. No focal lytic or blastic lesions are present Other neck: The soft tissues the neck are unremarkable. Upper chest: Lung apices are clear. Thoracic inlet is within normal limits. Review of the MIP images confirms the above findings CTA HEAD FINDINGS Anterior circulation: Atherosclerotic calcifications are present within the cavernous internal carotid arteries bilaterally. No significant stenosis is present through the ICA termini. The A1 and M1 segments are normal. The anterior communicating artery is patent. MCA bifurcations are intact bilaterally. The ACA and MCA branch vessels are normal. Posterior circulation: Left vertebral artery feeds basilar. The V4 segment is reconstituted into the right AICA. The basilar artery is normal. Both posterior cerebral arteries originate from basilar tip. The PCA branch vessels are within bilaterally. Venous sinuses: The dural sinuses are patent. Straight sinus deep cerebral veins are intact. Cortical veins are unremarkable. No vascular malformations are present. Anatomic variants: None Review of the MIP images confirms the above findings  IMPRESSION: 1. 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 2. The left vertebral artery is dominant. 3. The basilar artery is reconstituted into the right AICA. 4. Atherosclerotic changes at the carotid bifurcations bilaterally without significant stenosis. 5. No significant proximal stenosis, aneurysm, or branch vessel occlusion within the Circle of Willis. Electronically Signed   By: Marin Robertshristopher  Mattern M.D.   On: 09/22/2019 21:54    Scheduled Meds: . vitamin C  1,000 mg Oral Daily  . aspirin  325 mg Oral Daily  . cholecalciferol  1,000 Units Oral Daily  . clopidogrel  75 mg Oral Daily  . cyanocobalamin  1,000 mcg Intramuscular Daily  . doxazosin  8 mg Oral Daily  . finasteride  5 mg Oral Daily  . fluticasone  1 spray Each Nare Daily  . heparin injection (subcutaneous)  5,000 Units Subcutaneous Q8H  . insulin aspart  0-5 Units Subcutaneous QHS  . insulin aspart  0-9 Units Subcutaneous TID WC  . loratadine  10 mg Oral Daily  . pantoprazole (PROTONIX) IV  40 mg Intravenous Q24H  . simvastatin  20 mg Oral Daily   Continuous Infusions: . sodium chloride 50 mL/hr at 09/24/19 0616     LOS: 1 day    Time spent: 30 minutes.   Vassie Lollarlos Iridessa Harrow, MD [facility] Pager [phone]   09/24/2019, 4:20 PM

## 2019-09-24 NOTE — TOC Progression Note (Signed)
Transition of Care [facility]) - Progression Note    Patient Details  Name: Donald Conley MRN: [id number] Date of Birth: 03/02/1936  Transition of [facility]) CM/SW Contact  Elliot Gault, LCSW Phone Number: 09/24/2019, 2:57 PM  Clinical Narrative:     TOC following. Received call from [employer] regarding SNF auth saying it is too soon to request auth since pt is NPO and may need PEG. Request withdrawn. TOC will re-submit when pt progresses.  Expected Discharge Plan: Skilled Nursing Facility Barriers to Discharge: Continued Medical Work up  Expected Discharge Plan and Services Expected Discharge Plan: Skilled Nursing Facility       Living arrangements for the past 2 months: Single Family Home                                       Social Determinants of Health (SDOH) Interventions    Readmission Risk Interventions No flowsheet data found.

## 2019-09-24 NOTE — Progress Notes (Signed)
  Speech Language Pathology Treatment: Dysphagia  Patient Details Name: Ramonte Mena MRN: [id number] DOB: Jan 22, 1936 Today's Date: 09/24/2019 Time: 5093-2671 SLP Time Calculation (min) (ACUTE ONLY): 21 min  Assessment / Plan / Recommendation Clinical Impression  Pt seen for ongoing diagnostic dysphagia therapy targeting trials to determine readiness to initiate PO diet. Pt's daughter, present again today, had just completed oral care upon SLP entering room. With single ice chips, Pt did trigger a swallow but immediate congested "gurgle" is audible and shortly after coughing initiates. SLP then provided a tsp sip of thin liquids which Pt attempts to swallow, coughs immediately and expectorates entire bolus. SLP then attempted 1-2 more tsp and 2 tsp bites of applesauce with the same result, immediate coughing and expectoration of entire bolus/trial. Pt is at HIGH risk for aspiration with s/sx of severe pharyngeal dysphagia. Again question helpfulness/appropriateness for MBSS since Pt is unable to swallow anything, however, suspect long term needs for nutrition therefore objective measure may be helpful in decision making. ST will check in daily; recommend continue NPO and meds to be administered via alternative means. SLP educated daughter on QID oral care and recommend suction be set up in Pt's room.    HPI HPI: Mckade Gurka is a 84 y.o. 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  Patient woke up earlier not noticing any significant abnormality,but after waking up and trying to eat breakfast noticing difficulty swallowing and at the moment was noted by family  members difficulty on his speech and left facial droop.  Patient was brought to the hospital for further evaluation and management. Neurology reports "The patient undoubtedly has had the brainstem infarct likely involving the R - and particularly the medulla - that is not showing up on MRI"     SLP Plan  Continue with current plan of care;MBS       Recommendations  Diet recommendations: NPO Medication Administration: Via alternative means                SLP Visit Diagnosis: Dysphagia, unspecified (R13.10) Plan: Continue with current plan of care;MBS       Demisha Nokes H. Romie Levee, CCC-SLP Speech Language Pathologist    Georgetta Haber 09/24/2019, 9:37 AM

## 2019-09-25 ENCOUNTER — Inpatient Hospital Stay (HOSPITAL_COMMUNITY): Payer: Medicare PPO

## 2019-09-25 LAB — GLUCOSE, CAPILLARY
Glucose-Capillary: 106 mg/dL — ABNORMAL HIGH (ref 70–99)
Glucose-Capillary: 82 mg/dL (ref 70–99)
Glucose-Capillary: 86 mg/dL (ref 70–99)
Glucose-Capillary: 89 mg/dL (ref 70–99)
Glucose-Capillary: 93 mg/dL (ref 70–99)
Glucose-Capillary: 96 mg/dL (ref 70–99)

## 2019-09-25 MED ORDER — ASPIRIN 300 MG RE SUPP
300.0000 mg | Freq: Every day | RECTAL | Status: DC
  Administered 2019-09-25: 300 mg via RECTAL
  Filled 2019-09-25 (×2): qty 1

## 2019-09-25 NOTE — Progress Notes (Signed)
PROGRESS NOTE    Donald Conley  WUJ:[id number] DOB: 09-11-1935 DOA: 09/21/2019 PCP: Lauro Regulus, MD     Brief Narrative:  84 y.o. 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  Patient woke up earlier not noticing any significant abnormality, he used the bathroom and then went to bed again; after waking up and trying to eat breakfast noticing difficulty swallowing and at the moment was noted by family members difficulty on his speech and left facial droop.  Patient was brought to the hospital for further evaluation and management. Of note, patient reported while attempting to eat experiencing some nausea and vomiting after having difficulty passing the food from the back of his throat; it was just the content of the food that he was trying to wait seen in the vomitus.  Patient also expressed having some left-sided weakness. No fever, no chest pain, no palpitation, no dysuria, no hematuria, no cough, no shortness of breath, no melena, no hematochezia or any other complaints.  In the ED work-up demonstrated CT scan of the head negative for acute intracranial normalities, no signs of acute infection on her chest x-ray; UA without signs of infection, normal electrolytes, renal function WBCs and hemoglobin.  No EKGs abnormalities.  Neurology was consulted which recommended admission for TIA/CVA work-up.  TRH was consulted to facilitate further evaluation and management   Assessment & Plan: 1-TIA/brainstem stroke -Continue to have difficulty swallowing, right facial droop poor balance slurred speech -Concerns for brainstem stroke non visible on  MRI. -appreciate neurology assistance and recommendations. -Borderline B12 deficiency appreciated and repletion has been started -Speech therapy has performed MBS and half outpatient with severe pharyngeal dysphagia; there is anticipation that this will improve with rehabilitation but unclear what is the timeframe for that to happen.  Alternate route for nutrition, hydration and medications recommended (PEG tube placement).  General surgery has been consulted for PEG placement. -Continue aspirin per rectum ASA and IV medications when able.  Continue holding the rest of oral meds that cannot be provided at this time. -Patient remains essentially n.p.o. -Physical therapy recommending skilled nursing facility for rehabilitation -No acute stroke appreciated on CT, MRI it and without significant abnormalities on carotid Dopplers or 2D echo. -TSH within normal limits. -Plan is for dual antiplatelet therapy using full dose aspirin and Plavix; subsequently plan is for him to pursued 325 mg of aspirin on daily basis.  2-essential hypertension -Blood pressure has remained stable -Continue to allow permissive hypertension while acutely ruling out concerns for ischemia process.  3-BPH -Plan is to continue Proscar and Cardura unable to tolerate p.o.'s.  4-hyperlipidemia -Will resume Zocor when able to take by mouth. -LDL 110.  5-type 2 diabetes mellitus (chronically not on insulin therapy) -Continue holding oral hypoglycemic agents currently -A1c 6.6 demonstrating good control -Continue sliding scale insulin with close CBG monitoring while NPO.  6-GERD -Continue PPI; while unable to take p.o.'s will be given IV.  7-Allergy rhinitis -Continue Flonase -when able to tolerate by mouth will resume the use of loratadine.   DVT prophylaxis: Heparin Code Status: Full code. Family Communication: Daughter at bedside. Disposition Plan: Remains in the hospital; follow neurology service recommendations.   Per neurology patient with acute brainstem stroke (nonvisible  on MRI).-Right vertebral stenosis appreciated on CTA.  Continue dual antiplatelet therapy with aspirin and Plavix for 68-month; subsequently most likely full dose aspirin.  Patient continued to be weak, demonstrating significant difficulty on his swallowing ability.  Will require skilled nursing facility at discharge for rehabilitation and conditioning. Continue to work with SPL.  MBS demonstrating severe pharyngeal dysphagia, recommendations given for PEG tube placement as an alternate route for nutrition, hydration and medications.  Consultants:   Neurology service  Procedures:   See below for x-ray reports  Carotid Dopplers: Indicating minimal heterogeneous and calcified plaque, no hemodynamically significant stenosis by Doppler criteria in the extracranial cerebrovascular circulation.  Vertebral arteries with antegrade flow appreciated.  2D echo: 1. Left ventricular ejection fraction, by estimation, is 60 to 65%. The  left ventricle has normal function. The left ventricle has no regional  wall motion abnormalities. There is mild left ventricular hypertrophy of  the posterior and basal-septal  segments. Left ventricular diastolic parameters are consistent with Grade  I diastolic dysfunction (impaired relaxation).  2. Right ventricular systolic function is normal. The right ventricular  size is normal. There is mildly elevated pulmonary artery systolic  pressure.  3. The mitral valve is degenerative. No evidence of mitral valve  regurgitation.  4. The aortic valve is tricuspid. Aortic valve regurgitation is not  visualized. No aortic stenosis is present.   Antimicrobials:  Anti-infectives (From admission, onward)   None      Subjective: No fever, no CP, no nausea, no vomiting.  Continues to have significant dysphagia and impaired speech.  Overall the ulcer improvement in balance and strength.  Physical therapist  recommended skilled nursing facility at discharge.  Objective: Vitals:   09/24/19 1800 09/24/19 2113 09/25/19 0536 09/25/19 0758  BP: (!) 179/90 (!) 165/94 (!) 142/77 (!) 167/95  Pulse: 88 85 (!) 101 89  Resp: 20 20 19 19   Temp: 98.2 F (36.8 C) 98.6 F (37 C) 98.7 F (37.1 C) 97.8 F (36.6 C)  TempSrc: Oral Oral Oral Oral  SpO2: 97% 94% 92% 94%  Weight:      Height:        Intake/Output Summary (Last 24 hours) at 09/25/2019 1529 Last data filed at 09/25/2019 0707 Gross per 24 hour  Intake --  Output 950 ml  Net -950 ml   Filed Weights   09/21/19 1009  Weight: 90.7 kg    Examination: General exam: Alert, awake, oriented x 3; no fever, no chest pain, reports no nausea or vomiting.  Continues to have difficulty swallowing and that is demonstrated impaired speech.  Significant improvement in his balance. Respiratory system: Clear to auscultation. Respiratory effort normal. Cardiovascular system:RRR. No murmurs, rubs, gallops. Gastrointestinal system: Abdomen is nondistended, soft and nontender. No organomegaly or masses felt. Normal bowel sounds heard. Central nervous system: Alert and oriented. No new focal neurological deficits. Still ongoing dysphagia appreciated. Extremities: No C/C/E, +pedal pulses Skin: No rashes, lesions or ulcers Psychiatry: Judgement and insight appear normal. Mood & affect appropriate.   Data Reviewed: I have personally reviewed following labs and imaging studies  CBC: Recent Labs  Lab 09/21/19 1027 09/22/19 0455  WBC 9.8 11.0*  NEUTROABS 8.0*  --   HGB 15.3 14.1  HCT 45.5 41.5  MCV 88.9 88.9  PLT 257 263   Basic Metabolic Panel: Recent Labs  Lab 09/21/19 1027 09/22/19 0455  NA 138 140  K 3.7 3.4*  CL 99 104  CO2 26 26  GLUCOSE 218* 122*  BUN 11  10  CREATININE 0.79 0.61  CALCIUM 9.2 8.7*   GFR: Estimated Creatinine Clearance: 82.1 mL/min (by C-G formula based on SCr of 0.61 mg/dL).   Liver Function Tests: Recent Labs  Lab  09/21/19 1027  AST 19  ALT 19  ALKPHOS 46  BILITOT 1.1  PROT 7.4  ALBUMIN 4.1   Recent Labs  Lab 09/21/19 1027  LIPASE 22   CBG: Recent Labs  Lab 09/24/19 2021 09/25/19 0004 09/25/19 0405 09/25/19 0824 09/25/19 1150  GLUCAP 90 86 96 106* 82   Urine analysis:    Component Value Date/Time   COLORURINE YELLOW 03/30/2019 0916   APPEARANCEUR CLEAR 03/30/2019 0916   APPEARANCEUR Clear 04/10/2013 1155   LABSPEC 1.020 03/30/2019 0916   LABSPEC 1.025 04/10/2013 1155   PHURINE 5.0 03/30/2019 0916   GLUCOSEU 50 (A) 03/30/2019 0916   GLUCOSEU Negative 04/10/2013 1155   HGBUR NEGATIVE 03/30/2019 0916   BILIRUBINUR NEGATIVE 03/30/2019 0916   BILIRUBINUR Negative 04/10/2013 1155   KETONESUR NEGATIVE 03/30/2019 0916   PROTEINUR NEGATIVE 03/30/2019 0916   NITRITE NEGATIVE 03/30/2019 0916   LEUKOCYTESUR NEGATIVE 03/30/2019 0916   LEUKOCYTESUR Negative 04/10/2013 1155    Recent Results (from the past 240 hour(s))  Respiratory Panel by RT PCR (Flu A&B, Covid) - Nasopharyngeal Swab     Status: None   Collection Time: 09/21/19 10:53 AM   Specimen: Nasopharyngeal Swab  Result Value Ref Range Status   SARS Coronavirus 2 by RT PCR NEGATIVE NEGATIVE Final    Comment: (NOTE) SARS-CoV-2 target nucleic acids are NOT DETECTED. The SARS-CoV-2 RNA is generally detectable in upper respiratoy specimens during the acute phase of infection. The lowest concentration of SARS-CoV-2 viral copies this assay can detect is 131 copies/mL. A negative result does not preclude SARS-Cov-2 infection and should not be used as the sole basis for treatment or other patient management decisions. A negative result may occur with  improper specimen collection/handling, submission of specimen other than nasopharyngeal swab, presence of viral mutation(s) within the areas targeted by this assay, and inadequate number of viral copies (<131 copies/mL). A negative result must be combined with  clinical observations, patient history, and epidemiological information. The expected result is Negative. Fact Sheet for Patients:  PinkCheek.be Fact Sheet for Healthcare Providers:  GravelBags.it This test is not yet ap proved or cleared by the [place] FDA and  has been authorized for detection and/or diagnosis of SARS-CoV-2 by FDA under an Emergency Use Authorization (EUA). This EUA will remain  in effect (meaning this test can be used) for the duration of the COVID-19 declaration under Section 564(b)(1) of the Act, 21 U.S.C. section 360bbb-3(b)(1), unless the authorization is terminated or revoked sooner.    Influenza A by PCR NEGATIVE NEGATIVE Final   Influenza B by PCR NEGATIVE NEGATIVE Final    Comment: (NOTE) The Xpert Xpress SARS-CoV-2/FLU/RSV assay is intended as an aid in  the diagnosis of influenza from Nasopharyngeal swab specimens and  should not be used as a sole basis for treatment. Nasal washings and  aspirates are unacceptable for Xpert Xpress SARS-CoV-2/FLU/RSV  testing. Fact Sheet for Patients: PinkCheek.be Fact Sheet for Healthcare Providers: GravelBags.it This test is not yet approved or cleared by the [place] FDA and  has been authorized for detection and/or diagnosis of SARS-CoV-2 by  FDA under an Emergency Use Authorization (EUA). This EUA will remain  in effect (meaning this test can be used) for the duration of the  Covid-19 declaration under Section 564(b)(1) of the  Act, 21  U.S.C. section 360bbb-3(b)(1), unless the authorization is  terminated or revoked. Performed at [facility], [address] [place], [place] [address]      Radiology Studies: [facility]  Result Date: 09/25/2019 Objective Swallowing Evaluation: Type of Study: MBS-Modified Barium Swallow Study  Patient Details Name: Donald Conley  MRN: [id number] Date of Birth: Oct 24, 1935 Today's Date: 09/25/2019 Time: SLP Start Time (ACUTE ONLY): 1224 -SLP Stop Time (ACUTE ONLY): 1257 SLP Time Calculation (min) (ACUTE ONLY): 33 min Past Medical History: Past Medical History: Diagnosis Date . Allergy  . Arthritis  . Cataract  . GERD (gastroesophageal reflux disease)  Past Surgical History: Past Surgical History: Procedure Laterality Date . REPLACEMENT TOTAL KNEE BILATERAL   . TOTAL HIP ARTHROPLASTY Left  . WRIST ARTHROPLASTY Right  HPI: Donald Conley is a 84 y.o. 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  Patient woke up earlier not noticing any significant abnormality,but after waking up and trying to eat breakfast noticing difficulty swallowing and at the moment was noted by family members difficulty on his speech and left facial droop.  Patient was brought to the hospital for further evaluation and management. MRI reveals "No evidence of recent infarction, hemorrhage, or mass. Stable chronic findings detailed above."  Subjective: "They gave me a suction in the room." Assessment / Plan / Recommendation CHL IP CLINICAL IMPRESSIONS 09/25/2019 Clinical Impression Pt presents with functionally severe pharyngeal phase dysphagia characterized by reduced tongue base retraction, epiglottic deflection, pharyngeal stripping, and UES relaxation resulting in severe pharyngeal residuals with variable episodes of aspiration (generally sensed with cough/throat clear) during and after the swallow. Pt with improved pharyngeal clearance (however not consistent) with head turn to the RIGHT with chin tuck for all po trials. This  appeared to facilitate increased epiglottic deflection, pharyngeal stripping, and UES opening, however was not consistent. Pt with occasional episodes of trace aspiration from residuals with thin and nectar thick liquids. Pt is at risk for aspiration and impaired ability to meet nutrition/hydration needs due to acute stroke. 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 Recommend NPO with dysphagia treatment (ice chips PRN after oral care and trials bites of puree with head turn to the RIGHT).  SLP Visit Diagnosis Dysphagia, pharyngeal phase (R13.13);Dysphagia, pharyngoesophageal phase (R13.14) Attention and concentration deficit following -- Frontal lobe and executive function deficit following -- Impact on safety and function Moderate aspiration risk;Severe aspiration risk;Risk for inadequate nutrition/hydration   CHL IP TREATMENT RECOMMENDATION 09/25/2019 Treatment Recommendations Therapy as outlined in treatment plan below   Prognosis 09/25/2019 Prognosis for Safe Diet Advancement Guarded Barriers to Reach Goals Severity of deficits Barriers/Prognosis Comment -- CHL IP DIET RECOMMENDATION 09/25/2019 SLP Diet Recommendations NPO;Alternative means - long-term;Ice chips PRN after oral care Liquid Administration via -- Medication Administration Via alternative means Compensations Small sips/bites;Multiple dry swallows after each bite/sip Postural Changes --   CHL IP OTHER RECOMMENDATIONS 09/25/2019 Recommended Consults Consider GI evaluation Oral Care Recommendations Oral care QID Other Recommendations --   CHL IP FOLLOW UP RECOMMENDATIONS 09/25/2019 Follow up Recommendations [facility]  IP FREQUENCY AND DURATION 09/25/2019 Speech Therapy Frequency (ACUTE ONLY) min 2x/week Treatment Duration 1 week      CHL IP ORAL PHASE 09/25/2019 Oral Phase  WFL Oral - Pudding Teaspoon -- Oral - Pudding Cup -- Oral - Honey Teaspoon -- Oral - Honey Cup -- Oral - Nectar Teaspoon -- Oral - Nectar Cup -- Oral - Nectar Straw -- Oral - Thin Teaspoon -- Oral - Thin Cup -- Oral - Thin Straw -- Oral - Puree -- Oral - Mech Soft -- Oral - Regular -- Oral - Multi-Consistency -- Oral - Pill -- Oral Phase - Comment --  CHL IP PHARYNGEAL PHASE 09/25/2019 Pharyngeal Phase Impaired Pharyngeal- Pudding Teaspoon -- Pharyngeal -- Pharyngeal- Pudding Cup -- Pharyngeal -- Pharyngeal- Honey Teaspoon Delayed swallow initiation-vallecula;Reduced pharyngeal peristalsis;Reduced epiglottic inversion;Reduced tongue base retraction;Pharyngeal residue - valleculae;Pharyngeal residue - pyriform Pharyngeal -- Pharyngeal- Honey Cup -- Pharyngeal -- Pharyngeal- Nectar Teaspoon Delayed swallow initiation-pyriform sinuses;Reduced epiglottic inversion;Reduced pharyngeal peristalsis;Reduced airway/laryngeal closure;Reduced tongue base retraction;Penetration/Aspiration during swallow;Penetration/Apiration after swallow;Trace aspiration;Pharyngeal residue - valleculae;Pharyngeal residue - pyriform;Pharyngeal residue - cp segment Pharyngeal Material enters airway, passes BELOW cords then ejected out;Material enters airway, CONTACTS cords and not ejected out Pharyngeal- Nectar Cup -- Pharyngeal -- Pharyngeal- Nectar Straw -- Pharyngeal -- Pharyngeal- Thin Teaspoon Delayed swallow initiation-pyriform sinuses;Reduced pharyngeal peristalsis;Reduced epiglottic inversion;Reduced tongue base retraction;Penetration/Apiration after swallow;Trace aspiration;Pharyngeal residue - valleculae;Pharyngeal residue - pyriform Pharyngeal Material enters airway, passes BELOW cords and not ejected out despite cough attempt by patient;Material enters airway, passes BELOW cords then ejected out Pharyngeal- Thin Cup Reduced epiglottic inversion;Reduced pharyngeal peristalsis;Reduced airway/laryngeal closure;Penetration/Apiration after  swallow;Trace aspiration;Pharyngeal residue - valleculae;Pharyngeal residue - pyriform Pharyngeal Material enters airway, passes BELOW cords then ejected out;Material enters airway, CONTACTS cords and not ejected out Pharyngeal- Thin Straw -- Pharyngeal -- Pharyngeal- Puree Delayed swallow initiation-vallecula;Reduced pharyngeal peristalsis;Reduced epiglottic inversion;Reduced tongue base retraction;Pharyngeal residue - valleculae;Pharyngeal residue - pyriform;Pharyngeal residue - posterior pharnyx Pharyngeal -- Pharyngeal- Mechanical Soft -- Pharyngeal -- Pharyngeal- Regular -- Pharyngeal -- Pharyngeal- Multi-consistency -- Pharyngeal -- Pharyngeal- Pill -- Pharyngeal -- Pharyngeal Comment reduced tongue base retraction, epiglottic deflection, and pharyngeal stripping  CHL IP CERVICAL ESOPHAGEAL PHASE 09/25/2019 Cervical Esophageal Phase Impaired Pudding Teaspoon -- Pudding Cup -- Honey Teaspoon -- Honey Cup -- Nectar Teaspoon -- Nectar Cup -- Nectar Straw -- Thin Teaspoon Reduced cricopharyngeal relaxation;Prominent cricopharyngeal segment Thin Cup -- Thin Straw -- Puree -- Mechanical Soft -- Regular -- Multi-consistency -- Pill -- Cervical Esophageal Comment Reduced CP opening Thank you, Havery Moros, CCC-SLP [phone] PORTER,DABNEY 09/25/2019, 2:38 PM               Scheduled Meds: . vitamin C  1,000 mg Oral Daily  . aspirin  300 mg Rectal Daily  . cholecalciferol  1,000 Units Oral Daily  . clopidogrel  75 mg Oral Daily  . cyanocobalamin  1,000 mcg Intramuscular Daily  . doxazosin  8 mg Oral Daily  . finasteride  5 mg Oral Daily  . fluticasone  1 spray Each Nare Daily  . heparin injection (subcutaneous)  5,000 Units Subcutaneous Q8H  . insulin aspart  0-5 Units Subcutaneous QHS  . insulin aspart  0-9 Units Subcutaneous TID WC  . loratadine  10 mg Oral Daily  . pantoprazole (PROTONIX) IV  40 mg Intravenous Q24H  . simvastatin  20 mg Oral Daily   Continuous Infusions: . sodium chloride 50  mL/hr at 09/25/19 0221     LOS: 2 days    Time spent: 30 minutes.   Mikle Bosworth  Gwenlyn Perking, MD [facility] Pager [phone]   09/25/2019, 3:29 PM

## 2019-09-25 NOTE — Plan of Care (Signed)
  Problem: Acute Rehab OT Goals (only OT should resolve) Goal: Pt. Will Perform Upper Body Bathing Flowsheets (Taken 09/25/2019 1049) Pt Will Perform Upper Body Bathing:  with set-up  sitting Goal: Pt. Will Perform Lower Body Bathing Flowsheets (Taken 09/25/2019 1049) Pt Will Perform Lower Body Bathing:  with mod assist  sit to/from stand  sitting/lateral leans Goal: Pt. Will Perform Upper Body Dressing Flowsheets (Taken 09/25/2019 1049) Pt Will Perform Upper Body Dressing:  with set-up  sitting Goal: Pt. Will Perform Lower Body Dressing Flowsheets (Taken 09/25/2019 1049) Pt Will Perform Lower Body Dressing:  with mod assist  sit to/from stand  sitting/lateral leans Goal: Pt. Will Transfer To Toilet Flowsheets (Taken 09/25/2019 1049) Pt Will Transfer to Toilet:  with min assist  bedside commode  ambulating  regular height toilet Goal: Pt/Caregiver Will Perform Home Exercise Program Flowsheets (Taken 09/25/2019 1049) Pt/caregiver will Perform Home Exercise Program:  Increased strength  Both right and left upper extremity  With Supervision  With written HEP provided Goal: OT Additional ADL Goal #1 Flowsheets (Taken 09/25/2019 1049) Additional ADL Goal #1: Pt will increase static sitting balance while requiring only supervision with increase core stability and strength noted while completing a self care task for 10 minutes.

## 2019-09-25 NOTE — TOC Progression Note (Signed)
Transition of Care [facility]) - Progression Note    Patient Details  Name: Donald Conley MRN: [id number] Date of Birth: [birth year]  [facility]) CM/SW Contact  Erin Sons, [place] Phone Number: 09/25/2019, 3:08 PM  Clinical Narrative:     CSW called pt. Daughter and informed daughter that [facility] would not accept pt's insurance. CSW presented bed offer from [facility]. Daughter was agreeable to CSW following through with [facility].   Expected Discharge Plan: Skilled Nursing Facility Barriers to Discharge: Continued Medical Work up  Expected Discharge Plan and Services Expected Discharge Plan: Skilled Nursing Facility       Living arrangements for the past 2 months: Single Family Home                                       Social Determinants of Health (SDOH) Interventions    Readmission Risk Interventions No flowsheet data found.

## 2019-09-25 NOTE — Evaluation (Signed)
Occupational Therapy Evaluation Patient Details Name: Donald Conley MRN: [id number] DOB: Jun 09, 1935 Today's Date: 09/25/2019    History of Present Illness Donald Conley is a 84 y.o. 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  Patient woke up earlier not noticing any significant abnormality, he used the bathroom and then went to bed again; after waking up and trying to eat breakfast noticing difficulty swallowing and at the moment was noted by family members difficulty on his speech and left facial droop.  Patient was brought to the hospital for further evaluation and management.   Clinical Impression   Pt in bed upon therapy arrival and agreeable to participate in OT evaluation. Patient demonstrates increased functional performance during bed mobility and sitting balance based on PT evaluation over the weekend. Patient was able to transition to sitting on the edge of bed with min assist and sit with min assist for balance. Pt demonstrates decreased midline awareness when sitting, decreased activity tolerance and core stability while requiring more verbal and physical cueing to maintain balance. Pt will benefit from skilled OT services to focus on mentioned deficits while in acute care. Recommend discharge to SNF prior to returning home.     Follow Up Recommendations  SNF    Equipment Recommendations  None recommended by OT       Precautions / Restrictions Precautions Precautions: Fall Precaution Comments: Right lateral lean Restrictions Weight Bearing Restrictions: No      Mobility Bed Mobility Overal bed mobility: Needs Assistance Bed  Mobility: Supine to Sit;Sit to Supine     Supine to sit: Min assist;HOB elevated Sit to supine: Min assist      Transfers Overall transfer level: (Not completed this date. No recliner present in room)        Balance Overall balance assessment: Needs assistance Sitting-balance support: Feet supported;Bilateral upper extremity supported Sitting balance-Leahy Scale: Poor Sitting balance - Comments: Pt was able to maintain sitting balance initially and self correct with cues to adjust lateral lean or posterior lean. As session progressed, patient require Min assist for balance. Postural control: Posterior lean;Right lateral lean        ADL either performed or assessed with clinical judgement   ADL Overall ADL's : Needs assistance/impaired Eating/Feeding: Set up;Sitting   Grooming: Wash/dry face;Wash/dry hands;Set up;Sitting   Upper Body Bathing: Minimal assistance;Sitting   Lower Body Bathing: Maximal assistance;Bed level   Upper Body Dressing : Minimal assistance;Sitting   Lower Body Dressing: Maximal assistance;Bed level     Toilet Transfer Details (indicate cue type and reason): Not completed this date         Functional mobility during ADLs: Minimal assistance       Vision Baseline Vision/History: No visual deficits Patient Visual Report: Other (comment)(Daughter reports that initially, patient was experiencing double vision. During evaluation, no double vision was expressed.) Vision Assessment?: Yes Eye Alignment: Within Functional Limits Ocular Range of Motion: Within Functional Limits Alignment/Gaze Preference: Chin down;Head tilt Tracking/Visual Pursuits: Able to track stimulus in all quads without difficulty Saccades: Within functional limits Visual Fields: No apparent deficits            Pertinent Vitals/Pain Pain Assessment: No/denies pain     Hand Dominance Right   Extremity/Trunk  Assessment Upper Extremity Assessment Upper Extremity  Assessment: Generalized weakness(Decreased UB endurance although MMT bilaterally was assessed at 4+/5. Functional bilateral gross grasp.)   Lower Extremity Assessment Lower Extremity Assessment: Defer to PT evaluation       Communication Communication Communication: No difficulties   Cognition Arousal/Alertness: Awake/alert Behavior During Therapy: WFL for tasks assessed/performed Overall Cognitive Status: Within Functional Limits for tasks assessed                    Home Living Family/patient expects to be discharged to:: Skilled nursing facility Living Arrangements: Children(Daughter: Donald Conley) Available Help at Discharge: Family Type of Home: House Home Access: Ramped entrance     Home Layout: One level     Bathroom Shower/Tub: Estate manager/land agent Accessibility: Yes   Home Equipment: Environmental consultant - 2 wheels;Walker - 4 wheels;Walker - standard;Cane - single point;Shower seat;Transport chair;Toilet riser          Prior Functioning/Environment Level of Independence: Independent        Comments: Patient and daughter state he was independent with basic ADL, limited community ambulator without AD        OT Problem List: Impaired balance (sitting and/or standing);Decreased knowledge of use of DME or AE;Decreased activity tolerance;Decreased strength      OT Treatment/Interventions: Self-care/ADL training;Therapeutic exercise;Therapeutic activities;Neuromuscular education;Visual/perceptual remediation/compensation;DME and/or AE instruction;Patient/family education;Manual therapy;Modalities;Balance training    OT Goals(Current goals can be found in the care plan section) Acute Rehab OT Goals Patient Stated Goal: Go home when able. OT Goal Formulation: With patient Time For Goal Achievement: 10/09/19 Potential to Achieve Goals: Good  OT Frequency: Min 2X/week   Barriers to D/C:    None          AM-PAC OT "6 Clicks" Daily Activity     Outcome Measure Help  from another person eating meals?: A Little Help from another person taking care of personal grooming?: A Little Help from another person toileting, which includes using toliet, bedpan, or urinal?: A Lot Help from another person bathing (including washing, rinsing, drying)?: A Lot Help from another person to put on and taking off regular upper body clothing?: A Little Help from another person to put on and taking off regular lower body clothing?: A Lot 6 Click Score: 15   End of Session    Activity Tolerance: Patient tolerated treatment well Patient left: in bed;with call bell/phone within reach;with family/visitor present  OT Visit Diagnosis: Muscle weakness (generalized) (M62.81)                Time: 6606-3016 OT Time Calculation (min): 23 min Charges:  OT General Charges $OT Visit: 1 Visit OT Evaluation $OT Eval Low Complexity: 1 Low  Donald Conley, OTR/L,CBIS  [phone]   Donald Conley, Donald Conley March 09/25/2019, 10:43 AM

## 2019-09-25 NOTE — TOC Progression Note (Signed)
Transition of Care [facility]) - Progression Note    Patient Details  Name: Donald Conley MRN: [id number] Date of Birth: 1935/11/28  Transition of Care [facility]) CM/SW Contact  Leitha Bleak, RN Phone Number: 09/25/2019, 11:27 AM  Clinical Narrative:   [facility] called they do not participate with [employer] and can not make a bed offer.  Jamesetta So will update the family member that works at their facility.  Patient in need of a peg tube. TOC starting INS AUTH. Bed offer pending.     Expected Discharge Plan: Skilled Nursing Facility Barriers to Discharge: Continued Medical Work up  Expected Discharge Plan and Services Expected Discharge Plan: Skilled Nursing Facility

## 2019-09-25 NOTE — Progress Notes (Signed)
Modified Barium Swallow Progress Note  Patient Details  Name: Donald Conley MRN: [id number] Date of Birth: November 07, 1935  Today's Date: 09/25/2019  Modified Barium Swallow completed.  Full report located under Chart Review in the Imaging Section.  Brief recommendations include the following:  Clinical Impression  Pt presents with functionally severe pharyngeal phase dysphagia characterized by reduced tongue base retraction, epiglottic deflection, pharyngeal stripping, and UES relaxation resulting in severe pharyngeal residuals with variable episodes of aspiration (generally sensed with cough/throat clear) during and after the swallow. Pt with improved pharyngeal clearance (however not consistent) with head turn to the RIGHT with chin tuck for all po trials. This appeared to facilitate increased epiglottic deflection, pharyngeal stripping, and UES opening, however was not consistent. Pt with occasional episodes of trace aspiration from residuals with thin and nectar thick liquids. Pt is at risk for aspiration and impaired ability to meet nutrition/hydration needs due to acute stroke. 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 Recommend NPO with dysphagia treatment (ice chips PRN after oral care and trials bites of puree with head turn to the RIGHT).    Swallow Evaluation Recommendations   Recommended Consults: Consider GI evaluation   SLP Diet Recommendations: NPO;Alternative means - long-term;Ice chips PRN after oral care       Medication Administration: Via alternative means       Compensations: Small sips/bites;Multiple dry swallows after each bite/sip(for trials with SLP; and head turn R)       Oral Care Recommendations: Oral care QID       Thank you,  Havery Moros,  CCC-SLP [phone]  Zephan Beauchaine 09/25/2019,2:25 PM

## 2019-09-25 NOTE — Care Management Important Message (Signed)
Important Message  Patient Details  Name: Donald Conley MRN: [id number] Date of Birth: [birth year]   Medicare Important Message Given:  Yes     Corey Harold 09/25/2019, 3:11 PM

## 2019-09-26 DIAGNOSIS — R1310 Dysphagia, unspecified: Secondary | ICD-10-CM

## 2019-09-26 DIAGNOSIS — I639 Cerebral infarction, unspecified: Principal | ICD-10-CM

## 2019-09-26 LAB — GLUCOSE, CAPILLARY
Glucose-Capillary: 98 mg/dL (ref 70–99)
Glucose-Capillary: 97 mg/dL (ref 70–99)
Glucose-Capillary: 104 mg/dL — ABNORMAL HIGH (ref 70–99)
Glucose-Capillary: 96 mg/dL (ref 70–99)
Glucose-Capillary: 108 mg/dL — ABNORMAL HIGH (ref 70–99)

## 2019-09-26 MED ORDER — LABETALOL HCL 5 MG/ML IV SOLN: 10.0000 mg | INTRAVENOUS | Status: DC | PRN

## 2019-09-26 NOTE — Consult Note (Signed)
Reason for Consult: CVA, dysphagia Referring Physician: Dr. [name] is an 84 y.o. male.  HPI: Patient is an 84 year old white male who presented to [facility] with difficulty swallowing, facial droop, and slurred speech.  Ultimately he was diagnosed with a stroke which interestingly was not visible on MRI.  He currently is unable to eat or drink by mouth as per his swallowing test, thus needs gastrostomy tube placement.  He has been on Plavix and aspirin as well as subcutaneous heparin.  Patient is being placed in a rehab center.  He has somewhat recovered from his acute CVA, though he still suffers from dysphagia and inability to control his oral secretions.  Past Medical History:  Diagnosis Date  . Allergy   . Arthritis   . Cataract   . GERD (gastroesophageal reflux disease)     Past Surgical History:  Procedure Laterality Date  . REPLACEMENT TOTAL KNEE BILATERAL    . TOTAL HIP ARTHROPLASTY Left   . WRIST ARTHROPLASTY Right     Family History  Problem Relation Age of Onset  . Cancer Father     Social History:  reports that he has quit smoking. He has a 24.00 pack-year smoking history. He has never used smokeless tobacco. He reports that he does not drink alcohol or use drugs.  Allergies: No Known Allergies  Medications:  Scheduled: . vitamin C  1,000 mg Oral Daily  . cholecalciferol  1,000 Units Oral Daily  . cyanocobalamin  1,000 mcg Intramuscular Daily  . doxazosin  8 mg Oral Daily  . finasteride  5 mg Oral Daily  . fluticasone  1 spray Each Nare Daily  . insulin aspart  0-5 Units Subcutaneous QHS  . insulin aspart  0-9 Units Subcutaneous TID WC  . loratadine  10 mg Oral Daily  . pantoprazole (PROTONIX) IV  40 mg Intravenous Q24H  . simvastatin  20 mg Oral Daily    Results for orders placed or performed during the hospital encounter of 09/21/19 (from the past 48 hour(s))  Glucose, capillary     Status: None   Collection Time: 09/24/19 10:50  AM  Result Value Ref Range   Glucose-Capillary 90 70 - 99 mg/dL    Comment: Glucose reference range applies only to samples taken after fasting for at least 8 hours.  Glucose, capillary     Status: None   Collection Time: 09/24/19  4:32 PM  Result Value Ref Range   Glucose-Capillary 83 70 - 99 mg/dL    Comment: Glucose reference range applies only to samples taken after fasting for at least 8 hours.  Glucose, capillary     Status: None   Collection Time: 09/24/19  8:21 PM  Result Value Ref Range   Glucose-Capillary 90 70 - 99 mg/dL    Comment: Glucose reference range applies only to samples taken after fasting for at least 8 hours.  Glucose, capillary     Status: None   Collection Time: 09/25/19 12:04 AM  Result Value Ref Range   Glucose-Capillary 86 70 - 99 mg/dL    Comment: Glucose reference range applies only to samples taken after fasting for at least 8 hours.  Glucose, capillary     Status: None   Collection Time: 09/25/19  4:05 AM  Result Value Ref Range   Glucose-Capillary 96 70 - 99 mg/dL    Comment: Glucose reference range applies only to samples taken after fasting for at least 8 hours.  Glucose, capillary  Status: Abnormal   Collection Time: 09/25/19  8:24 AM  Result Value Ref Range   Glucose-Capillary 106 (H) 70 - 99 mg/dL    Comment: Glucose reference range applies only to samples taken after fasting for at least 8 hours.   Comment 1 Notify RN    Comment 2 Document in Chart   Glucose, capillary     Status: None   Collection Time: 09/25/19 11:50 AM  Result Value Ref Range   Glucose-Capillary 82 70 - 99 mg/dL    Comment: Glucose reference range applies only to samples taken after fasting for at least 8 hours.   Comment 1 Notify RN    Comment 2 Document in Chart   Glucose, capillary     Status: None   Collection Time: 09/25/19  5:22 PM  Result Value Ref Range   Glucose-Capillary 89 70 - 99 mg/dL    Comment: Glucose reference range applies only to samples taken  after fasting for at least 8 hours.  Glucose, capillary     Status: None   Collection Time: 09/25/19  9:42 PM  Result Value Ref Range   Glucose-Capillary 93 70 - 99 mg/dL    Comment: Glucose reference range applies only to samples taken after fasting for at least 8 hours.  Glucose, capillary     Status: None   Collection Time: 09/26/19  2:56 AM  Result Value Ref Range   Glucose-Capillary 98 70 - 99 mg/dL    Comment: Glucose reference range applies only to samples taken after fasting for at least 8 hours.  Glucose, capillary     Status: None   Collection Time: 09/26/19  7:38 AM  Result Value Ref Range   Glucose-Capillary 97 70 - 99 mg/dL    Comment: Glucose reference range applies only to samples taken after fasting for at least 8 hours.    DG Swallowing Func-Speech Pathology  Result Date: 09/25/2019 Objective Swallowing Evaluation: Type of Study: MBS-Modified Barium Swallow Study  Patient Details Name: Donald Conley MRN: [id number] Date of Birth: 12/20/1935 Today's Date: 09/25/2019 Time: SLP Start Time (ACUTE ONLY): 1224 -SLP Stop Time (ACUTE ONLY): 1257 SLP Time Calculation (min) (ACUTE ONLY): 33 min Past Medical History: Past Medical History: Diagnosis Date . Allergy  . Arthritis  . Cataract  . GERD (gastroesophageal reflux disease)  Past Surgical History: Past Surgical History: Procedure Laterality Date . REPLACEMENT TOTAL KNEE BILATERAL   . TOTAL HIP ARTHROPLASTY Left  . WRIST ARTHROPLASTY Right  HPI: Donald Conley is a 84 y.o. 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  Patient woke up earlier not noticing any significant abnormality,but after  waking up and trying to eat breakfast noticing difficulty swallowing and at the moment was noted by family members difficulty on his speech and left facial droop.  Patient was brought to the hospital for further evaluation and management. MRI reveals "No evidence of recent infarction, hemorrhage, or mass. Stable chronic findings detailed above."  Subjective: "They gave me a suction in the room." Assessment / Plan / Recommendation CHL IP CLINICAL IMPRESSIONS 09/25/2019 Clinical Impression Pt presents with functionally severe pharyngeal phase dysphagia characterized by reduced tongue base retraction, epiglottic deflection, pharyngeal stripping, and UES relaxation resulting in severe pharyngeal  residuals with variable episodes of aspiration (generally sensed with cough/throat clear) during and after the swallow. Pt with improved pharyngeal clearance (however not consistent) with head turn to the RIGHT with chin tuck for all po trials. This appeared to facilitate increased epiglottic deflection, pharyngeal stripping, and UES opening, however was not consistent. Pt with occasional episodes of trace aspiration from residuals with thin and nectar thick liquids. Pt is at risk for aspiration and impaired ability to meet nutrition/hydration needs due to acute stroke. 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 Recommend NPO with dysphagia treatment (ice chips PRN after oral care and trials bites of puree with head turn to the RIGHT).  SLP Visit Diagnosis Dysphagia, pharyngeal phase (R13.13);Dysphagia, pharyngoesophageal phase (R13.14) Attention and concentration deficit following -- Frontal lobe and executive function deficit following -- Impact on safety and function Moderate aspiration risk;Severe aspiration risk;Risk for inadequate nutrition/hydration   CHL IP  TREATMENT RECOMMENDATION 09/25/2019 Treatment Recommendations Therapy as outlined in treatment plan below   Prognosis 09/25/2019 Prognosis for Safe Diet Advancement Guarded Barriers to Reach Goals Severity of deficits Barriers/Prognosis Comment -- CHL IP DIET RECOMMENDATION 09/25/2019 SLP Diet Recommendations NPO;Alternative means - long-term;Ice chips PRN after oral care Liquid Administration via -- Medication Administration Via alternative means Compensations Small sips/bites;Multiple dry swallows after each bite/sip Postural Changes --   CHL IP OTHER RECOMMENDATIONS 09/25/2019 Recommended Consults Consider GI evaluation Oral Care Recommendations Oral care QID Other Recommendations --   CHL IP FOLLOW UP RECOMMENDATIONS 09/25/2019 Follow up Recommendations Skilled Nursing facility   [facility] IP FREQUENCY AND DURATION 09/25/2019 Speech Therapy Frequency (ACUTE ONLY) min 2x/week Treatment Duration 1 week      CHL IP ORAL PHASE 09/25/2019 Oral Phase WFL Oral - Pudding Teaspoon -- Oral - Pudding Cup -- Oral - Honey Teaspoon -- Oral - Honey Cup -- Oral - Nectar Teaspoon -- Oral - Nectar Cup -- Oral - Nectar Straw -- Oral - Thin Teaspoon -- Oral - Thin Cup -- Oral - Thin Straw -- Oral - Puree -- Oral - Mech Soft -- Oral - Regular -- Oral - Multi-Consistency -- Oral - Pill -- Oral Phase - Comment --  CHL IP PHARYNGEAL PHASE 09/25/2019 Pharyngeal Phase Impaired Pharyngeal- Pudding Teaspoon -- Pharyngeal -- Pharyngeal- Pudding Cup -- Pharyngeal -- Pharyngeal- Honey Teaspoon Delayed swallow initiation-vallecula;Reduced pharyngeal peristalsis;Reduced epiglottic inversion;Reduced tongue base retraction;Pharyngeal residue - valleculae;Pharyngeal residue - pyriform Pharyngeal -- Pharyngeal- Honey Cup -- Pharyngeal -- Pharyngeal- Nectar Teaspoon Delayed swallow initiation-pyriform sinuses;Reduced epiglottic inversion;Reduced pharyngeal peristalsis;Reduced airway/laryngeal closure;Reduced tongue base retraction;Penetration/Aspiration during  swallow;Penetration/Apiration after swallow;Trace aspiration;Pharyngeal residue - valleculae;Pharyngeal residue - pyriform;Pharyngeal residue - cp segment Pharyngeal Material enters airway, passes BELOW cords then ejected out;Material enters airway, CONTACTS cords and not ejected out Pharyngeal- Nectar Cup -- Pharyngeal -- Pharyngeal- Nectar Straw -- Pharyngeal -- Pharyngeal- Thin Teaspoon Delayed swallow initiation-pyriform sinuses;Reduced pharyngeal peristalsis;Reduced epiglottic inversion;Reduced tongue base retraction;Penetration/Apiration after swallow;Trace aspiration;Pharyngeal residue - valleculae;Pharyngeal residue - pyriform Pharyngeal Material enters airway, passes BELOW cords and not ejected out despite cough attempt by patient;Material enters airway, passes BELOW cords then ejected out Pharyngeal- Thin Cup Reduced epiglottic inversion;Reduced pharyngeal peristalsis;Reduced airway/laryngeal closure;Penetration/Apiration after swallow;Trace aspiration;Pharyngeal residue - valleculae;Pharyngeal residue - pyriform Pharyngeal Material enters airway, passes BELOW cords then ejected out;Material enters airway, CONTACTS cords and not ejected out Pharyngeal- Thin Straw -- Pharyngeal -- Pharyngeal- Puree Delayed swallow initiation-vallecula;Reduced pharyngeal  peristalsis;Reduced epiglottic inversion;Reduced tongue base retraction;Pharyngeal residue - valleculae;Pharyngeal residue - pyriform;Pharyngeal residue - posterior pharnyx Pharyngeal -- Pharyngeal- Mechanical Soft -- Pharyngeal -- Pharyngeal- Regular -- Pharyngeal -- Pharyngeal- Multi-consistency -- Pharyngeal -- Pharyngeal- Pill -- Pharyngeal -- Pharyngeal Comment reduced tongue base retraction, epiglottic deflection, and pharyngeal stripping  CHL IP CERVICAL ESOPHAGEAL PHASE 09/25/2019 Cervical Esophageal Phase Impaired Pudding Teaspoon -- Pudding Cup -- Honey Teaspoon -- Honey Cup -- Nectar Teaspoon -- Nectar Cup -- Nectar Straw -- Thin Teaspoon Reduced  cricopharyngeal relaxation;Prominent cricopharyngeal segment Thin Cup -- Thin Straw -- Puree -- Mechanical Soft -- Regular -- Multi-consistency -- Pill -- Cervical Esophageal Comment Reduced CP opening Thank you, Havery Moros, CCC-SLP [id number] PORTER,DABNEY 09/25/2019, 2:38 PM               ROS:  Pertinent items are noted in HPI.  Blood pressure (!) 190/90, pulse 95, temperature 99 F (37.2 C), temperature source Oral, resp. rate 20, height 5\' 11"  (1.803 m), weight 90.7 kg, SpO2 90 %. Physical Exam: Pleasant white male no acute distress Patient using suction to control oral secretions Lungs clear to auscultation with good breath sounds bilaterally Heart examination reveals a regular rate and rhythm without S3, S4, murmurs Abdomen is soft, nontender, nondistended.  Assessment/Plan: Impression: CVA, dysphagia Plan: Patient will undergo PEG placement tomorrow.  The risks and benefits of the procedure including bleeding, infection, inability to perform procedure due to hiatal hernia, and bowel injury were explained to the patient and daughter, who gave informed consent.  Patient is an increased risk for bleeding due to Plavix and aspirin.  These will be held prior to procedure.  09/26/2019, 8:04 AM

## 2019-09-26 NOTE — Progress Notes (Signed)
PROGRESS NOTE    Donald Conley  VWP:[id number] DOB: 1936/03/11 DOA: 09/21/2019 PCP: Lauro Regulus, MD     Brief Narrative:  84 y.o. 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  Patient woke up earlier not noticing any significant abnormality, he used the bathroom and then went to bed again; after waking up and trying to eat breakfast noticing difficulty swallowing and at the moment was noted by family members difficulty on his speech and left facial droop.  Patient was brought to the hospital for further evaluation and management. Of note, patient reported while attempting to eat experiencing some nausea and vomiting after having difficulty passing the food from the back of his throat; it was just the content of the food that he was trying to wait seen in the vomitus.  Patient also expressed having some left-sided weakness. No fever, no chest pain, no palpitation, no dysuria, no hematuria, no cough, no shortness of breath, no melena, no hematochezia or any other complaints.  In the ED work-up demonstrated CT scan of the head negative for acute intracranial normalities, no signs of acute infection on her chest x-ray; UA without signs of infection, normal electrolytes, renal function WBCs and hemoglobin.  No EKGs abnormalities.  Neurology was consulted which recommended admission for TIA/CVA work-up.  TRH was consulted to facilitate further evaluation and management   Assessment & Plan: 1-nonhemorrhagic brainstem stroke -Continue to have difficulty swallowing, right facial droop poor balance slurred speech -Concerns for brainstem stroke non  visible on MRI. -appreciate neurology assistance and recommendations. -Borderline B12 deficiency appreciated and repletion has been started -Speech therapy has performed MBS and half outpatient with severe pharyngeal dysphagia; there is anticipation that this will improve with rehabilitation but unclear what is the timeframe for that to happen.  Alternate route for nutrition, hydration and medications recommended (PEG tube placement).  General surgery has been consulted for PEG placement and plan is for PEG on 09/27/19. -Continue aspirin per rectum ASA and IV medications when able.  Continue holding the rest of oral meds that cannot be provided at this time. -Patient remains essentially n.p.o. -Physical therapy recommending skilled nursing facility for rehabilitation -No acute stroke appreciated on CT, MRI it and without significant abnormalities on carotid Dopplers or 2D echo. -TSH within normal limits. -Plan is for dual antiplatelet therapy using full dose aspirin and Plavix; subsequently plan is for him to pursued 325 mg of aspirin on daily basis.  2-essential hypertension -Blood pressure has remained stable -Continue to allow permissive hypertension while acutely ruling out concerns for ischemia process.  3-BPH -Plan is to continue Proscar and Cardura unable to tolerate p.o.'s.  4-hyperlipidemia -Will resume Zocor when able to take by mouth. -LDL 110.  5-type 2 diabetes mellitus (chronically not on insulin therapy) -Continue holding oral hypoglycemic agents currently -A1c 6.6 demonstrating good control -Continue sliding scale insulin with close CBG monitoring while NPO.  6-GERD -Continue PPI; while unable to take p.o.'s will be given IV.  7-Allergy rhinitis -Continue Flonase -when able to tolerate by mouth will resume the use of loratadine.   DVT prophylaxis: Heparin Code Status: Full code. Family Communication: Daughter at bedside. Disposition Plan: Remains in the hospital;  follow neurology service recommendations.  Per neurology patient with acute brainstem stroke (nonvisible on MRI).-Right vertebral stenosis appreciated on CTA.  Continue dual antiplatelet therapy with aspirin and Plavix for 1660-month; subsequently most likely full dose aspirin.  Patient continued to be weak, demonstrating significant difficulty on his swallowing ability.  Will require skilled nursing facility at discharge for rehabilitation and conditioning. Continue to work with SPL.  MBS demonstrating severe pharyngeal dysphagia, recommendations given for PEG tube placement as an alternate route for nutrition, hydration and medications.  Once PEG tube is placed, patient will be medically stable to discharge to next menu for rehabilitation and care.  Consultants:   Neurology service  Procedures:   See below for x-ray reports  Carotid Dopplers: Indicating minimal heterogeneous and calcified plaque, no hemodynamically significant stenosis by Doppler criteria in the extracranial cerebrovascular circulation.  Vertebral arteries with antegrade flow appreciated.  2D echo: 1. Left ventricular ejection fraction, by estimation, is 60 to 65%. The  left ventricle has normal function. The left ventricle has no regional  wall motion abnormalities. There is mild left ventricular hypertrophy of  the posterior and basal-septal  segments. Left ventricular diastolic parameters are consistent with Grade  I diastolic dysfunction (impaired relaxation).  2. Right ventricular systolic function is normal. The right ventricular  size is normal. There is mildly elevated pulmonary artery systolic  pressure.  3. The mitral valve is degenerative. No evidence of mitral valve  regurgitation.  4. The aortic valve is tricuspid. Aortic valve regurgitation is not  visualized. No aortic stenosis is present.   Antimicrobials:  Anti-infectives (From admission, onward)   None      Subjective: No fever, no chest pain, no  nausea, no vomiting.  Continues to have significant dysphagia, slurred speech; there has been improvement in his balance.  No events overnight.  Objective: Vitals:   09/25/19 1515 09/25/19 2109 09/25/19 2139 09/26/19 0545  BP: (!) 174/90  (!) 172/89 (!) 190/90  Pulse: 80  85 95  Resp: 18  20 20   Temp: 99.1 F (37.3 C)  98.9 F (37.2 C) 99 F (37.2 C)  TempSrc: Oral  Oral Oral  SpO2: 95% 92% 93% 90%  Weight:      Height:        Intake/Output Summary (Last 24 hours) at 09/26/2019 0739 Last data filed at 09/26/2019 0500 Gross per 24 hour  Intake --  Output 975 ml  Net -975 ml   Filed Weights   09/21/19 1009  Weight: 90.7 kg    Examination: General exam: Alert, awake, oriented x 3; no fever, no chest pain, no nausea or vomiting.  No overnight events.  Continues to have some difficulty speaking and with impaired swallowing.  No other complaints currently. Respiratory system: Clear to auscultation. Respiratory effort normal. Cardiovascular system:RRR. No murmurs, rubs, gallops. Gastrointestinal system: Abdomen is nondistended, soft and nontender. No organomegaly or masses felt. Normal bowel sounds heard. Central nervous system: Alert and oriented. No focal neurological deficits. Extremities: No C/C/E, +pedal pulses Skin: No rashes, lesions or ulcers Psychiatry: Judgement and insight appear normal. Mood & affect appropriate.    Data Reviewed: I have personally reviewed following labs and imaging studies  CBC: Recent Labs  Lab 09/21/19 1027 09/22/19 0455  WBC 9.8 11.0*  NEUTROABS 8.0*  --   HGB 15.3 14.1  HCT 45.5 41.5  MCV 88.9 88.9  PLT 257 263   Basic Metabolic Panel: Recent Labs  Lab 09/21/19 1027 09/22/19 0455  NA 138 140  K 3.7 3.4*  CL  99 104  CO2 26 26  GLUCOSE 218* 122*  BUN 11 10  CREATININE 0.79 0.61  CALCIUM 9.2 8.7*   GFR: Estimated Creatinine Clearance: 82.1 mL/min (by C-G formula based on SCr of 0.61 mg/dL).   Liver Function Tests: Recent  Labs  Lab 09/21/19 1027  AST 19  ALT 19  ALKPHOS 46  BILITOT 1.1  PROT 7.4  ALBUMIN 4.1   Recent Labs  Lab 09/21/19 1027  LIPASE 22   CBG: Recent Labs  Lab 09/25/19 1150 09/25/19 1722 09/25/19 2142 09/26/19 0256 09/26/19 0738  GLUCAP 82 89 93 98 97   Urine analysis:    Component Value Date/Time   COLORURINE YELLOW 03/30/2019 0916   APPEARANCEUR CLEAR 03/30/2019 0916   APPEARANCEUR Clear 04/10/2013 1155   LABSPEC 1.020 03/30/2019 0916   LABSPEC 1.025 04/10/2013 1155   PHURINE 5.0 03/30/2019 0916   GLUCOSEU 50 (A) 03/30/2019 0916   GLUCOSEU Negative 04/10/2013 1155   HGBUR NEGATIVE 03/30/2019 0916   BILIRUBINUR NEGATIVE 03/30/2019 0916   BILIRUBINUR Negative 04/10/2013 1155   KETONESUR NEGATIVE 03/30/2019 0916   PROTEINUR NEGATIVE 03/30/2019 0916   NITRITE NEGATIVE 03/30/2019 0916   LEUKOCYTESUR NEGATIVE 03/30/2019 0916   LEUKOCYTESUR Negative 04/10/2013 1155    Recent Results (from the past 240 hour(s))  Respiratory Panel by RT PCR (Flu A&B, Covid) - Nasopharyngeal Swab     Status: None   Collection Time: 09/21/19 10:53 AM   Specimen: Nasopharyngeal Swab  Result Value Ref Range Status   SARS Coronavirus 2 by RT PCR NEGATIVE NEGATIVE Final    Comment: (NOTE) SARS-CoV-2 target nucleic acids are NOT DETECTED. The SARS-CoV-2 RNA is generally detectable in upper respiratoy specimens during the acute phase of infection. The lowest concentration of SARS-CoV-2 viral copies this assay can detect is 131 copies/mL. A negative result does not preclude SARS-Cov-2 infection and should not be used as the sole basis for treatment or other patient management decisions. A negative result may occur with  improper specimen collection/handling, submission of specimen other than nasopharyngeal swab, presence of viral mutation(s) within the areas targeted by this assay, and inadequate number of viral copies (<131 copies/mL). A negative result must be combined with  clinical observations, patient history, and epidemiological information. The expected result is Negative. Fact Sheet for Patients:  PinkCheek.be Fact Sheet for Healthcare Providers:  GravelBags.it This test is not yet ap proved or cleared by the [place] FDA and  has been authorized for detection and/or diagnosis of SARS-CoV-2 by FDA under an Emergency Use Authorization (EUA). This EUA will remain  in effect (meaning this test can be used) for the duration of the COVID-19 declaration under Section 564(b)(1) of the Act, 21 U.S.C. section 360bbb-3(b)(1), unless the authorization is terminated or revoked sooner.    Influenza A by PCR NEGATIVE NEGATIVE Final   Influenza B by PCR NEGATIVE NEGATIVE Final    Comment: (NOTE) The Xpert Xpress SARS-CoV-2/FLU/RSV assay is intended as an aid in  the diagnosis of influenza from Nasopharyngeal swab specimens and  should not be used as a sole basis for treatment. Nasal washings and  aspirates are unacceptable for Xpert Xpress SARS-CoV-2/FLU/RSV  testing. Fact Sheet for Patients: PinkCheek.be Fact Sheet for Healthcare Providers: GravelBags.it This test is not yet approved or cleared by the [place] FDA and  has been authorized for detection and/or diagnosis of SARS-CoV-2 by  FDA under an Emergency Use Authorization (EUA). This EUA will remain  in effect (meaning this test can be used)  for the duration of the  Covid-19 declaration under Section 564(b)(1) of the Act, 21  U.S.C. section 360bbb-3(b)(1), unless the authorization is  terminated or revoked. Performed at [facility], [address], [place], [place] [address]      Radiology Studies: DG Swallowing Func-Speech Pathology  Result Date: 09/25/2019 Objective Swallowing Evaluation: Type of Study: MBS-Modified Barium Swallow Study  Patient Details Name: Donald Conley  MRN: [id number] Date of Birth: 01-Dec-1935 Today's Date: 09/25/2019 Time: SLP Start Time (ACUTE ONLY): 1224 -SLP Stop Time (ACUTE ONLY): 1257 SLP Time Calculation (min) (ACUTE ONLY): 33 min Past Medical History: Past Medical History: Diagnosis Date  Allergy   Arthritis   Cataract   GERD (gastroesophageal reflux disease)  Past Surgical History: Past Surgical History: Procedure Laterality Date  REPLACEMENT TOTAL KNEE BILATERAL    TOTAL HIP ARTHROPLASTY Left   WRIST ARTHROPLASTY Right  HPI: Tadeusz Stahl is a 84 y.o. 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  Patient woke up earlier not noticing any significant abnormality,but after waking up and trying to eat breakfast noticing difficulty swallowing and at the moment was noted by family members difficulty on his speech and left facial droop.  Patient was brought to the hospital for further evaluation and management. MRI reveals "No evidence of recent infarction, hemorrhage, or mass. Stable chronic findings detailed above."  Subjective: "They gave me a suction in the room." Assessment / Plan / Recommendation CHL IP CLINICAL IMPRESSIONS 09/25/2019 Clinical Impression Pt presents with functionally severe pharyngeal phase dysphagia characterized by reduced tongue base retraction, epiglottic deflection, pharyngeal stripping, and UES relaxation resulting in severe pharyngeal residuals with variable episodes of aspiration (generally sensed with cough/throat clear) during and after the swallow. Pt with improved pharyngeal clearance (however not consistent) with head turn to the RIGHT with chin tuck for all po trials. This  appeared to facilitate increased epiglottic deflection, pharyngeal stripping, and UES opening, however was not consistent. Pt with occasional episodes of trace aspiration from residuals with thin and nectar thick liquids. Pt is at risk for aspiration and impaired ability to meet nutrition/hydration needs due to acute stroke. 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 Recommend NPO with dysphagia treatment (ice chips PRN after oral care and trials bites of puree with head turn to the RIGHT).  SLP Visit Diagnosis Dysphagia, pharyngeal phase (R13.13);Dysphagia, pharyngoesophageal phase (R13.14) Attention and concentration deficit following -- Frontal lobe and executive function deficit following -- Impact on safety and function Moderate aspiration risk;Severe aspiration risk;Risk for inadequate nutrition/hydration   CHL IP TREATMENT RECOMMENDATION 09/25/2019 Treatment Recommendations Therapy as outlined in treatment plan below   Prognosis 09/25/2019 Prognosis for Safe Diet Advancement Guarded Barriers to Reach Goals Severity of deficits Barriers/Prognosis Comment -- CHL IP DIET RECOMMENDATION 09/25/2019 SLP Diet Recommendations NPO;Alternative means - long-term;Ice chips PRN after oral care Liquid Administration via -- Medication Administration Via alternative means Compensations Small sips/bites;Multiple dry swallows after each bite/sip Postural Changes --   CHL IP OTHER RECOMMENDATIONS 09/25/2019 Recommended Consults Consider GI evaluation Oral Care Recommendations Oral care QID Other Recommendations --   CHL IP  FOLLOW UP RECOMMENDATIONS 09/25/2019 Follow up Recommendations Skilled Nursing facility   [facility] IP FREQUENCY AND DURATION 09/25/2019 Speech Therapy Frequency (ACUTE ONLY) min 2x/week Treatment Duration 1 week      CHL IP ORAL PHASE 09/25/2019 Oral Phase  WFL Oral - Pudding Teaspoon -- Oral - Pudding Cup -- Oral - Honey Teaspoon -- Oral - Honey Cup -- Oral - Nectar Teaspoon -- Oral - Nectar Cup -- Oral - Nectar Straw -- Oral - Thin Teaspoon -- Oral - Thin Cup -- Oral - Thin Straw -- Oral - Puree -- Oral - Mech Soft -- Oral - Regular -- Oral - Multi-Consistency -- Oral - Pill -- Oral Phase - Comment --  CHL IP PHARYNGEAL PHASE 09/25/2019 Pharyngeal Phase Impaired Pharyngeal- Pudding Teaspoon -- Pharyngeal -- Pharyngeal- Pudding Cup -- Pharyngeal -- Pharyngeal- Honey Teaspoon Delayed swallow initiation-vallecula;Reduced pharyngeal peristalsis;Reduced epiglottic inversion;Reduced tongue base retraction;Pharyngeal residue - valleculae;Pharyngeal residue - pyriform Pharyngeal -- Pharyngeal- Honey Cup -- Pharyngeal -- Pharyngeal- Nectar Teaspoon Delayed swallow initiation-pyriform sinuses;Reduced epiglottic inversion;Reduced pharyngeal peristalsis;Reduced airway/laryngeal closure;Reduced tongue base retraction;Penetration/Aspiration during swallow;Penetration/Apiration after swallow;Trace aspiration;Pharyngeal residue - valleculae;Pharyngeal residue - pyriform;Pharyngeal residue - cp segment Pharyngeal Material enters airway, passes BELOW cords then ejected out;Material enters airway, CONTACTS cords and not ejected out Pharyngeal- Nectar Cup -- Pharyngeal -- Pharyngeal- Nectar Straw -- Pharyngeal -- Pharyngeal- Thin Teaspoon Delayed swallow initiation-pyriform sinuses;Reduced pharyngeal peristalsis;Reduced epiglottic inversion;Reduced tongue base retraction;Penetration/Apiration after swallow;Trace aspiration;Pharyngeal residue - valleculae;Pharyngeal residue - pyriform Pharyngeal Material enters airway, passes BELOW cords and not ejected out despite cough attempt by patient;Material enters airway, passes BELOW cords then ejected out Pharyngeal- Thin Cup Reduced epiglottic inversion;Reduced pharyngeal peristalsis;Reduced airway/laryngeal closure;Penetration/Apiration after  swallow;Trace aspiration;Pharyngeal residue - valleculae;Pharyngeal residue - pyriform Pharyngeal Material enters airway, passes BELOW cords then ejected out;Material enters airway, CONTACTS cords and not ejected out Pharyngeal- Thin Straw -- Pharyngeal -- Pharyngeal- Puree Delayed swallow initiation-vallecula;Reduced pharyngeal peristalsis;Reduced epiglottic inversion;Reduced tongue base retraction;Pharyngeal residue - valleculae;Pharyngeal residue - pyriform;Pharyngeal residue - posterior pharnyx Pharyngeal -- Pharyngeal- Mechanical Soft -- Pharyngeal -- Pharyngeal- Regular -- Pharyngeal -- Pharyngeal- Multi-consistency -- Pharyngeal -- Pharyngeal- Pill -- Pharyngeal -- Pharyngeal Comment reduced tongue base retraction, epiglottic deflection, and pharyngeal stripping  CHL IP CERVICAL ESOPHAGEAL PHASE 09/25/2019 Cervical Esophageal Phase Impaired Pudding Teaspoon -- Pudding Cup -- Honey Teaspoon -- Honey Cup -- Nectar Teaspoon -- Nectar Cup -- Nectar Straw -- Thin Teaspoon Reduced cricopharyngeal relaxation;Prominent cricopharyngeal segment Thin Cup -- Thin Straw -- Puree -- Mechanical Soft -- Regular -- Multi-consistency -- Pill -- Cervical Esophageal Comment Reduced CP opening Thank you, Havery Moros, CCC-SLP [phone] PORTER,DABNEY 09/25/2019, 2:38 PM               Scheduled Meds:  vitamin C  1,000 mg Oral Daily   aspirin  300 mg Rectal Daily   cholecalciferol  1,000 Units Oral Daily   clopidogrel  75 mg Oral Daily   cyanocobalamin  1,000 mcg Intramuscular Daily   doxazosin  8 mg Oral Daily   finasteride  5 mg Oral Daily   fluticasone  1 spray Each Nare Daily   heparin injection (subcutaneous)  5,000 Units Subcutaneous Q8H   insulin aspart  0-5 Units Subcutaneous QHS   insulin aspart  0-9 Units Subcutaneous TID WC   loratadine  10 mg Oral Daily   pantoprazole (PROTONIX) IV  40 mg Intravenous Q24H   simvastatin  20 mg Oral Daily   Continuous Infusions:  sodium chloride 50  mL/hr at 09/25/19 2146  LOS: 3 days    Time spent: 30 minutes.   Vassie Loll, MD [facility] Pager [phone]   09/26/2019, 7:39 AM

## 2019-09-26 NOTE — H&P (View-Only) (Signed)
Reason for Consult: CVA, dysphagia Referring Physician: Dr. [name] is an 84 y.o. male.  HPI: Patient is an 84 year old white male who presented to [facility] with difficulty swallowing, facial droop, and slurred speech.  Ultimately he was diagnosed with a stroke which interestingly was not visible on MRI.  He currently is unable to eat or drink by mouth as per his swallowing test, thus needs gastrostomy tube placement.  He has been on Plavix and aspirin as well as subcutaneous heparin.  Patient is being placed in a rehab center.  He has somewhat recovered from his acute CVA, though he still suffers from dysphagia and inability to control his oral secretions.  Past Medical History:  Diagnosis Date  . Allergy   . Arthritis   . Cataract   . GERD (gastroesophageal reflux disease)     Past Surgical History:  Procedure Laterality Date  . REPLACEMENT TOTAL KNEE BILATERAL    . TOTAL HIP ARTHROPLASTY Left   . WRIST ARTHROPLASTY Right     Family History  Problem Relation Age of Onset  . Cancer Father     Social History:  reports that he has quit smoking. He has a 24.00 pack-year smoking history. He has never used smokeless tobacco. He reports that he does not drink alcohol or use drugs.  Allergies: No Known Allergies  Medications:  Scheduled: . vitamin C  1,000 mg Oral Daily  . cholecalciferol  1,000 Units Oral Daily  . cyanocobalamin  1,000 mcg Intramuscular Daily  . doxazosin  8 mg Oral Daily  . finasteride  5 mg Oral Daily  . fluticasone  1 spray Each Nare Daily  . insulin aspart  0-5 Units Subcutaneous QHS  . insulin aspart  0-9 Units Subcutaneous TID WC  . loratadine  10 mg Oral Daily  . pantoprazole (PROTONIX) IV  40 mg Intravenous Q24H  . simvastatin  20 mg Oral Daily    Results for orders placed or performed during the hospital encounter of 09/21/19 (from the past 48 hour(s))  Glucose, capillary     Status: None   Collection Time: 09/24/19 10:50  AM  Result Value Ref Range   Glucose-Capillary 90 70 - 99 mg/dL    Comment: Glucose reference range applies only to samples taken after fasting for at least 8 hours.  Glucose, capillary     Status: None   Collection Time: 09/24/19  4:32 PM  Result Value Ref Range   Glucose-Capillary 83 70 - 99 mg/dL    Comment: Glucose reference range applies only to samples taken after fasting for at least 8 hours.  Glucose, capillary     Status: None   Collection Time: 09/24/19  8:21 PM  Result Value Ref Range   Glucose-Capillary 90 70 - 99 mg/dL    Comment: Glucose reference range applies only to samples taken after fasting for at least 8 hours.  Glucose, capillary     Status: None   Collection Time: 09/25/19 12:04 AM  Result Value Ref Range   Glucose-Capillary 86 70 - 99 mg/dL    Comment: Glucose reference range applies only to samples taken after fasting for at least 8 hours.  Glucose, capillary     Status: None   Collection Time: 09/25/19  4:05 AM  Result Value Ref Range   Glucose-Capillary 96 70 - 99 mg/dL    Comment: Glucose reference range applies only to samples taken after fasting for at least 8 hours.  Glucose, capillary  Status: Abnormal   Collection Time: 09/25/19  8:24 AM  Result Value Ref Range   Glucose-Capillary 106 (H) 70 - 99 mg/dL    Comment: Glucose reference range applies only to samples taken after fasting for at least 8 hours.   Comment 1 Notify RN    Comment 2 Document in Chart   Glucose, capillary     Status: None   Collection Time: 09/25/19 11:50 AM  Result Value Ref Range   Glucose-Capillary 82 70 - 99 mg/dL    Comment: Glucose reference range applies only to samples taken after fasting for at least 8 hours.   Comment 1 Notify RN    Comment 2 Document in Chart   Glucose, capillary     Status: None   Collection Time: 09/25/19  5:22 PM  Result Value Ref Range   Glucose-Capillary 89 70 - 99 mg/dL    Comment: Glucose reference range applies only to samples taken  after fasting for at least 8 hours.  Glucose, capillary     Status: None   Collection Time: 09/25/19  9:42 PM  Result Value Ref Range   Glucose-Capillary 93 70 - 99 mg/dL    Comment: Glucose reference range applies only to samples taken after fasting for at least 8 hours.  Glucose, capillary     Status: None   Collection Time: 09/26/19  2:56 AM  Result Value Ref Range   Glucose-Capillary 98 70 - 99 mg/dL    Comment: Glucose reference range applies only to samples taken after fasting for at least 8 hours.  Glucose, capillary     Status: None   Collection Time: 09/26/19  7:38 AM  Result Value Ref Range   Glucose-Capillary 97 70 - 99 mg/dL    Comment: Glucose reference range applies only to samples taken after fasting for at least 8 hours.    DG Swallowing Func-Speech Pathology  Result Date: 09/25/2019 Objective Swallowing Evaluation: Type of Study: MBS-Modified Barium Swallow Study  Patient Details Name: Donald Conley MRN: [id number] Date of Birth: 12/20/1935 Today's Date: 09/25/2019 Time: SLP Start Time (ACUTE ONLY): 1224 -SLP Stop Time (ACUTE ONLY): 1257 SLP Time Calculation (min) (ACUTE ONLY): 33 min Past Medical History: Past Medical History: Diagnosis Date . Allergy  . Arthritis  . Cataract  . GERD (gastroesophageal reflux disease)  Past Surgical History: Past Surgical History: Procedure Laterality Date . REPLACEMENT TOTAL KNEE BILATERAL   . TOTAL HIP ARTHROPLASTY Left  . WRIST ARTHROPLASTY Right  HPI: Donald Conley is a 84 y.o. 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  Patient woke up earlier not noticing any significant abnormality,but after  waking up and trying to eat breakfast noticing difficulty swallowing and at the moment was noted by family members difficulty on his speech and left facial droop.  Patient was brought to the hospital for further evaluation and management. MRI reveals "No evidence of recent infarction, hemorrhage, or mass. Stable chronic findings detailed above."  Subjective: "They gave me a suction in the room." Assessment / Plan / Recommendation CHL IP CLINICAL IMPRESSIONS 09/25/2019 Clinical Impression Pt presents with functionally severe pharyngeal phase dysphagia characterized by reduced tongue base retraction, epiglottic deflection, pharyngeal stripping, and UES relaxation resulting in severe pharyngeal  residuals with variable episodes of aspiration (generally sensed with cough/throat clear) during and after the swallow. Pt with improved pharyngeal clearance (however not consistent) with head turn to the RIGHT with chin tuck for all po trials. This appeared to facilitate increased epiglottic deflection, pharyngeal stripping, and UES opening, however was not consistent. Pt with occasional episodes of trace aspiration from residuals with thin and nectar thick liquids. Pt is at risk for aspiration and impaired ability to meet nutrition/hydration needs due to acute stroke. 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 Recommend NPO with dysphagia treatment (ice chips PRN after oral care and trials bites of puree with head turn to the RIGHT).  SLP Visit Diagnosis Dysphagia, pharyngeal phase (R13.13);Dysphagia, pharyngoesophageal phase (R13.14) Attention and concentration deficit following -- Frontal lobe and executive function deficit following -- Impact on safety and function Moderate aspiration risk;Severe aspiration risk;Risk for inadequate nutrition/hydration   CHL IP  TREATMENT RECOMMENDATION 09/25/2019 Treatment Recommendations Therapy as outlined in treatment plan below   Prognosis 09/25/2019 Prognosis for Safe Diet Advancement Guarded Barriers to Reach Goals Severity of deficits Barriers/Prognosis Comment -- CHL IP DIET RECOMMENDATION 09/25/2019 SLP Diet Recommendations NPO;Alternative means - long-term;Ice chips PRN after oral care Liquid Administration via -- Medication Administration Via alternative means Compensations Small sips/bites;Multiple dry swallows after each bite/sip Postural Changes --   CHL IP OTHER RECOMMENDATIONS 09/25/2019 Recommended Consults Consider GI evaluation Oral Care Recommendations Oral care QID Other Recommendations --   CHL IP FOLLOW UP RECOMMENDATIONS 09/25/2019 Follow up Recommendations Skilled Nursing facility   CHL IP FREQUENCY AND DURATION 09/25/2019 Speech Therapy Frequency (ACUTE ONLY) min 2x/week Treatment Duration 1 week      CHL IP ORAL PHASE 09/25/2019 Oral Phase WFL Oral - Pudding Teaspoon -- Oral - Pudding Cup -- Oral - Honey Teaspoon -- Oral - Honey Cup -- Oral - Nectar Teaspoon -- Oral - Nectar Cup -- Oral - Nectar Straw -- Oral - Thin Teaspoon -- Oral - Thin Cup -- Oral - Thin Straw -- Oral - Puree -- Oral - Mech Soft -- Oral - Regular -- Oral - Multi-Consistency -- Oral - Pill -- Oral Phase - Comment --  CHL IP PHARYNGEAL PHASE 09/25/2019 Pharyngeal Phase Impaired Pharyngeal- Pudding Teaspoon -- Pharyngeal -- Pharyngeal- Pudding Cup -- Pharyngeal -- Pharyngeal- Honey Teaspoon Delayed swallow initiation-vallecula;Reduced pharyngeal peristalsis;Reduced epiglottic inversion;Reduced tongue base retraction;Pharyngeal residue - valleculae;Pharyngeal residue - pyriform Pharyngeal -- Pharyngeal- Honey Cup -- Pharyngeal -- Pharyngeal- Nectar Teaspoon Delayed swallow initiation-pyriform sinuses;Reduced epiglottic inversion;Reduced pharyngeal peristalsis;Reduced airway/laryngeal closure;Reduced tongue base retraction;Penetration/Aspiration during  swallow;Penetration/Apiration after swallow;Trace aspiration;Pharyngeal residue - valleculae;Pharyngeal residue - pyriform;Pharyngeal residue - cp segment Pharyngeal Material enters airway, passes BELOW cords then ejected out;Material enters airway, CONTACTS cords and not ejected out Pharyngeal- Nectar Cup -- Pharyngeal -- Pharyngeal- Nectar Straw -- Pharyngeal -- Pharyngeal- Thin Teaspoon Delayed swallow initiation-pyriform sinuses;Reduced pharyngeal peristalsis;Reduced epiglottic inversion;Reduced tongue base retraction;Penetration/Apiration after swallow;Trace aspiration;Pharyngeal residue - valleculae;Pharyngeal residue - pyriform Pharyngeal Material enters airway, passes BELOW cords and not ejected out despite cough attempt by patient;Material enters airway, passes BELOW cords then ejected out Pharyngeal- Thin Cup Reduced epiglottic inversion;Reduced pharyngeal peristalsis;Reduced airway/laryngeal closure;Penetration/Apiration after swallow;Trace aspiration;Pharyngeal residue - valleculae;Pharyngeal residue - pyriform Pharyngeal Material enters airway, passes BELOW cords then ejected out;Material enters airway, CONTACTS cords and not ejected out Pharyngeal- Thin Straw -- Pharyngeal -- Pharyngeal- Puree Delayed swallow initiation-vallecula;Reduced pharyngeal   peristalsis;Reduced epiglottic inversion;Reduced tongue base retraction;Pharyngeal residue - valleculae;Pharyngeal residue - pyriform;Pharyngeal residue - posterior pharnyx Pharyngeal -- Pharyngeal- Mechanical Soft -- Pharyngeal -- Pharyngeal- Regular -- Pharyngeal -- Pharyngeal- Multi-consistency -- Pharyngeal -- Pharyngeal- Pill -- Pharyngeal -- Pharyngeal Comment reduced tongue base retraction, epiglottic deflection, and pharyngeal stripping  CHL IP CERVICAL ESOPHAGEAL PHASE 09/25/2019 Cervical Esophageal Phase Impaired Pudding Teaspoon -- Pudding Cup -- Honey Teaspoon -- Honey Cup -- Nectar Teaspoon -- Nectar Cup -- Nectar Straw -- Thin Teaspoon Reduced  cricopharyngeal relaxation;Prominent cricopharyngeal segment Thin Cup -- Thin Straw -- Puree -- Mechanical Soft -- Regular -- Multi-consistency -- Pill -- Cervical Esophageal Comment Reduced CP opening Thank you, Havery Moros, CCC-SLP [phone] PORTER,DABNEY 09/25/2019, 2:38 PM               ROS:  Pertinent items are noted in HPI.  Blood pressure (!) 190/90, pulse 95, temperature 99 F (37.2 C), temperature source Oral, resp. rate 20, height 5\' 11"  (1.803 m), weight 90.7 kg, SpO2 90 %. Physical Exam: Pleasant white male no acute distress Patient using suction to control oral secretions Lungs clear to auscultation with good breath sounds bilaterally Heart examination reveals a regular rate and rhythm without S3, S4, murmurs Abdomen is soft, nontender, nondistended.  Assessment/Plan: Impression: CVA, dysphagia Plan: Patient will undergo PEG placement tomorrow.  The risks and benefits of the procedure including bleeding, infection, inability to perform procedure due to hiatal hernia, and bowel injury were explained to the patient and daughter, who gave informed consent.  Patient is an increased risk for bleeding due to Plavix and aspirin.  These will be held prior to procedure.  09/26/2019, 8:04 AM

## 2019-09-26 NOTE — Care Management (Signed)
Kerby Moors # [id number] Beginning date is April 21, approved for 3 days, next review date is April 23rd per [facility]

## 2019-09-26 NOTE — TOC Progression Note (Signed)
Transition of Care [facility]) - Progression Note    Patient Details  Name: Donald Conley MRN: [id number] Date of Birth: [birth year]  [facility] CM/SW Contact  Annayah Worthley, Chrystine Oiler, RN Phone Number: 09/26/2019, 12:33 PM  Clinical Narrative:   Updated daughter of bed at [facility], ins auth received. Patient to have PEG placed 4/21 and watch for 24 hours prior to DC. [facility] updated.     Expected Discharge Plan: Skilled Nursing Facility Barriers to Discharge: Continued Medical Work up  Expected Discharge Plan and Services Expected Discharge Plan: Skilled Nursing Facility       Living arrangements for the past 2 months: Single Family Home                         Social Determinants of Health (SDOH) Interventions    Readmission Risk Interventions No flowsheet data found.

## 2019-09-26 NOTE — Progress Notes (Signed)
Physical Therapy Treatment Patient Details Name: Donald Conley MRN: [id number] DOB: 09-13-1935 Today's Date: 09/26/2019    History of Present Illness Donald Conley is a 84 y.o. 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  Patient woke up earlier not noticing any significant abnormality, he used the bathroom and then went to bed again; after waking up and trying to eat breakfast noticing difficulty swallowing and at the moment was noted by family members difficulty on his speech and left facial droop.  Patient was brought to the hospital for further evaluation and management.      PT Comments    Patient attempts to transition to seated EOB with slow, labored movements but requires assist to fully transition to seated. Upon sitting, patient demonstrates improving postural control and use of RUE for support. He requires frequent verbal cueing for RUE use and straightening elbow to assist with balance as well as intermittent physical assist to upright trunk. He also demonstrates improving awareness of midline today. He tolerates exercises seated EOB and has most difficulty with seated weight shifting. Attempted to stand patient with use of RW and physical assist but patient unable to provide enough LE strength to transfer to standing successfully. Patient returned to bed with daughter present for session. Patient will benefit from continued physical therapy in hospital and recommended venue below to increase strength, balance, endurance for safe ADLs and gait.   Follow Up Recommendations  SNF     Equipment Recommendations  None recommended by PT     Recommendations for Other Services       Precautions / Restrictions Precautions Precautions: Fall Precaution Comments: Right lateral lean Restrictions Weight Bearing Restrictions: No    Mobility  Bed Mobility Overal bed mobility: Needs Assistance Bed Mobility: Supine to Sit;Sit to Supine     Supine to sit: Min assist Sit to supine: Min assist   General bed mobility comments: patient attempts to transition to seated EOB with slow, labored movements but has difficulty getting to full seated position and requires min assist to transition  to seated EOB  Transfers Overall transfer level: Needs assistance Equipment used: Rolling walker (2 wheeled) Transfers: Sit to/from Stand           General transfer comment: attempted to transfer patient to standing with use of RW x3 but patient was unable due to impaired LE strength, verbal cueign for sequencing and RW use  Ambulation/Gait                 Stairs             Wheelchair Mobility    Modified Rankin (Stroke Patients Only)       Balance Overall balance assessment: Needs assistance Sitting-balance support: Feet supported;Bilateral upper extremity supported Sitting balance-Leahy Scale: Poor Sitting balance - Comments: Pt was able to maintain sitting balance initially and self correct with cues to adjust lateral lean or posterior lean. Required intermittent physical assist to to return to neutral Postural control: Posterior lean;Right lateral lean                                  Cognition Arousal/Alertness: Awake/alert Behavior During  Therapy: WFL for tasks assessed/performed Overall Cognitive Status: Within Functional Limits for tasks assessed                                        Exercises General Exercises - Lower Extremity Ankle Circles/Pumps: AROM;Both;10 reps;Seated Long Arc Quad: AROM;Both;10 reps;Seated Hip Flexion/Marching: AROM;Both;10 reps;Seated Other  Exercises Other Exercises: seated weight shifting onto R and L UE with cueing for postural control and UE use 10x each direction    General Comments        Pertinent Vitals/Pain Pain Assessment: No/denies pain    Home Living                      Prior Function            PT Goals (current goals can now be found in the care plan section) Acute Rehab PT Goals Patient Stated Goal: Recover and then go home PT Goal Formulation: With patient/family Time For Goal Achievement: 10/06/19 Potential to Achieve Goals: Fair Progress towards PT goals: Progressing toward goals    Frequency    Min 3X/week      PT Plan Current plan remains appropriate    Co-evaluation              AM-PAC PT "6 Clicks" Mobility   Outcome Measure  Help needed turning from your back to your side while in a flat bed without using bedrails?: None Help needed moving from lying on your back to sitting on the side of a flat bed without using bedrails?: A Little Help needed moving to and from a bed to a chair (including a wheelchair)?: Total Help needed standing up from a chair using your arms (e.g., wheelchair or bedside chair)?: Total Help needed to walk in hospital room?: Total Help needed climbing 3-5 steps with a railing? : Total 6 Click Score: 11    End of Session Equipment Utilized During Treatment: Gait belt Activity Tolerance: Patient tolerated treatment well;Patient limited by fatigue Patient left: in bed;with call bell/phone within reach;with bed alarm set;with family/visitor present Nurse Communication: Mobility status PT Visit Diagnosis: Unsteadiness on feet (R26.81);Other abnormalities of gait and mobility (R26.89);Muscle weakness (generalized) (M62.81);History of falling (Z91.81);Other symptoms and signs involving the nervous system (G64.403)     Time: 4742-5956 PT Time Calculation (min) (ACUTE ONLY): 24 min  Charges:  $Therapeutic Exercise: 8-22 mins $Therapeutic  Activity: 8-22 mins                     10:24 AM, 09/26/19 Wyman Songster PT, DPT Physical Therapist at [facility]

## 2019-09-27 ENCOUNTER — Inpatient Hospital Stay (HOSPITAL_COMMUNITY): Payer: Medicare PPO

## 2019-09-27 ENCOUNTER — Inpatient Hospital Stay (HOSPITAL_COMMUNITY): Payer: Medicare PPO | Admitting: Anesthesiology

## 2019-09-27 ENCOUNTER — Encounter (HOSPITAL_COMMUNITY): Payer: Self-pay | Admitting: Internal Medicine

## 2019-09-27 ENCOUNTER — Encounter (HOSPITAL_COMMUNITY)
Admission: EM | Disposition: A | Discharge status: Hospice | Payer: Self-pay | Source: Home / Self Care | Attending: Internal Medicine

## 2019-09-27 DIAGNOSIS — R0902 Hypoxemia: Secondary | ICD-10-CM

## 2019-09-27 DIAGNOSIS — R1312 Dysphagia, oropharyngeal phase: Secondary | ICD-10-CM

## 2019-09-27 HISTORY — PX: ESOPHAGOGASTRODUODENOSCOPY (EGD) WITH PROPOFOL: SHX5813

## 2019-09-27 HISTORY — PX: PEG PLACEMENT: SHX5437

## 2019-09-27 LAB — TROPONIN I (HIGH SENSITIVITY): Troponin I (High Sensitivity): 28 ng/L — ABNORMAL HIGH (ref <18)

## 2019-09-27 LAB — BASIC METABOLIC PANEL
Anion gap: 13 (ref 5–15)
Anion gap: 10 (ref 5–15)
BUN: 15 mg/dL (ref 8–23)
BUN: 18 mg/dL (ref 8–23)
CO2: 24 mmol/L (ref 22–32)
CO2: 28 mmol/L (ref 22–32)
Calcium: 8.3 mg/dL — ABNORMAL LOW (ref 8.9–10.3)
Calcium: 8 mg/dL — ABNORMAL LOW (ref 8.9–10.3)
Chloride: 102 mmol/L (ref 98–111)
Chloride: 102 mmol/L (ref 98–111)
Creatinine, Ser: 0.72 mg/dL (ref 0.61–1.24)
Creatinine, Ser: 1.26 mg/dL — ABNORMAL HIGH (ref 0.61–1.24)
GFR calc Af Amer: >60 mL/min (ref >60)
GFR calc Af Amer: >60 mL/min (ref >60)
GFR calc non Af Amer: >60 mL/min (ref >60)
GFR calc non Af Amer: 53 mL/min — ABNORMAL LOW (ref >60)
Glucose, Bld: 122 mg/dL — ABNORMAL HIGH (ref 70–99)
Glucose, Bld: 167 mg/dL — ABNORMAL HIGH (ref 70–99)
Potassium: 3.7 mmol/L (ref 3.5–5.1)
Potassium: 4.7 mmol/L (ref 3.5–5.1)
Sodium: 139 mmol/L (ref 135–145)
Sodium: 140 mmol/L (ref 135–145)

## 2019-09-27 LAB — LACTIC ACID, PLASMA: Lactic Acid, Venous: 1.8 mmol/L (ref 0.5–1.9)

## 2019-09-27 LAB — CBC
HCT: 46.8 % (ref 39.0–52.0)
Hemoglobin: 15.9 g/dL (ref 13.0–17.0)
MCH: 30.2 pg (ref 26.0–34.0)
MCHC: 34 g/dL (ref 30.0–36.0)
MCV: 89 fL (ref 80.0–100.0)
Platelets: 286 10*3/uL (ref 150–400)
RBC: 5.26 MIL/uL (ref 4.22–5.81)
RDW: 13 % (ref 11.5–15.5)
WBC: 8.9 10*3/uL (ref 4.0–10.5)
nRBC: 0 % (ref 0.0–0.2)

## 2019-09-27 LAB — CBC WITH DIFFERENTIAL/PLATELET
Abs Immature Granulocytes: 0.09 10*3/uL — ABNORMAL HIGH (ref 0.00–0.07)
Basophils Absolute: 0.1 10*3/uL (ref 0.0–0.1)
Basophils Relative: 1 %
Eosinophils Absolute: 0.2 10*3/uL (ref 0.0–0.5)
Eosinophils Relative: 1 %
HCT: 48.4 % (ref 39.0–52.0)
Hemoglobin: 15.7 g/dL (ref 13.0–17.0)
Immature Granulocytes: 1 %
Lymphocytes Relative: 4 %
Lymphs Abs: 0.5 10*3/uL — ABNORMAL LOW (ref 0.7–4.0)
MCH: 30.1 pg (ref 26.0–34.0)
MCHC: 32.4 g/dL (ref 30.0–36.0)
MCV: 92.7 fL (ref 80.0–100.0)
Monocytes Absolute: 0.8 10*3/uL (ref 0.1–1.0)
Monocytes Relative: 6 %
Neutro Abs: 12.5 10*3/uL — ABNORMAL HIGH (ref 1.7–7.7)
Neutrophils Relative %: 87 %
Platelets: 347 10*3/uL (ref 150–400)
RBC: 5.22 MIL/uL (ref 4.22–5.81)
RDW: 12.9 % (ref 11.5–15.5)
WBC: 14.2 10*3/uL — ABNORMAL HIGH (ref 4.0–10.5)
nRBC: 0 % (ref 0.0–0.2)

## 2019-09-27 LAB — URINALYSIS, ROUTINE W REFLEX MICROSCOPIC
Bacteria, UA: NONE SEEN
Bilirubin Urine: NEGATIVE
Glucose, UA: NEGATIVE mg/dL
Hgb urine dipstick: NEGATIVE
Ketones, ur: 20 mg/dL — AB
Leukocytes,Ua: NEGATIVE
Nitrite: NEGATIVE
Protein, ur: 30 mg/dL — AB
Specific Gravity, Urine: 1.023 (ref 1.005–1.030)
pH: 5 (ref 5.0–8.0)

## 2019-09-27 LAB — HEPATIC FUNCTION PANEL
ALT: 25 U/L (ref 0–44)
AST: 27 U/L (ref 15–41)
Albumin: 3.5 g/dL (ref 3.5–5.0)
Alkaline Phosphatase: 51 U/L (ref 38–126)
Bilirubin, Direct: 0.4 mg/dL — ABNORMAL HIGH (ref 0.0–0.2)
Indirect Bilirubin: 0.8 mg/dL (ref 0.3–0.9)
Total Bilirubin: 1.2 mg/dL (ref 0.3–1.2)
Total Protein: 5.9 g/dL — ABNORMAL LOW (ref 6.5–8.1)

## 2019-09-27 LAB — BLOOD GAS, ARTERIAL
Acid-Base Excess: 0.3 mmol/L (ref 0.0–2.0)
Bicarbonate: 23.3 mmol/L (ref 20.0–28.0)
Drawn by: 22223
FIO2: 100
MECHVT: 610 mL
O2 Saturation: 98.2 %
PEEP: 5 cmH2O
Patient temperature: 37.2
RATE: 22 resp/min
pCO2 arterial: 58.5 mmHg — ABNORMAL HIGH (ref 32.0–48.0)
pH, Arterial: 7.276 — ABNORMAL LOW (ref 7.350–7.450)
pO2, Arterial: 113 mmHg — ABNORMAL HIGH (ref 83.0–108.0)

## 2019-09-27 LAB — GLUCOSE, CAPILLARY
Glucose-Capillary: 109 mg/dL — ABNORMAL HIGH (ref 70–99)
Glucose-Capillary: 120 mg/dL — ABNORMAL HIGH (ref 70–99)
Glucose-Capillary: 121 mg/dL — ABNORMAL HIGH (ref 70–99)
Glucose-Capillary: 134 mg/dL — ABNORMAL HIGH (ref 70–99)
Glucose-Capillary: 130 mg/dL — ABNORMAL HIGH (ref 70–99)
Glucose-Capillary: 153 mg/dL — ABNORMAL HIGH (ref 70–99)
Glucose-Capillary: 170 mg/dL — ABNORMAL HIGH (ref 70–99)

## 2019-09-27 LAB — SURGICAL PCR SCREEN
MRSA, PCR: NEGATIVE
Staphylococcus aureus: POSITIVE — AB

## 2019-09-27 LAB — MAGNESIUM: Magnesium: 2.3 mg/dL (ref 1.7–2.4)

## 2019-09-27 LAB — TSH: TSH: 0.245 u[IU]/mL — ABNORMAL LOW (ref 0.350–4.500)

## 2019-09-27 LAB — CK: Total CK: 42 U/L — ABNORMAL LOW (ref 49–397)

## 2019-09-27 SURGERY — ESOPHAGOGASTRODUODENOSCOPY (EGD) WITH PROPOFOL
Anesthesia: General

## 2019-09-27 MED ORDER — CHLORHEXIDINE GLUCONATE CLOTH 2 % EX PADS: 6.0000 | MEDICATED_PAD | Freq: Once | CUTANEOUS | Status: DC

## 2019-09-27 MED ORDER — DOXAZOSIN MESYLATE 2 MG PO TABS
8.0000 mg | ORAL_TABLET | Freq: Every day | ORAL | Status: DC
  Administered 2019-09-27: 8 mg
  Filled 2019-09-27: qty 4

## 2019-09-27 MED ORDER — LACTATED RINGERS IV SOLN
INTRAVENOUS | Status: DC
  Administered 2019-09-27: 1000 mL via INTRAVENOUS

## 2019-09-27 MED ORDER — PIPERACILLIN-TAZOBACTAM 3.375 G IVPB
3.3750 g | Freq: Three times a day (TID) | INTRAVENOUS | Status: DC
  Administered 2019-09-28: 3.375 g via INTRAVENOUS
  Filled 2019-09-27: qty 50

## 2019-09-27 MED ORDER — PROPOFOL 10 MG/ML IV BOLUS
INTRAVENOUS | Status: AC
  Filled 2019-09-27: qty 20

## 2019-09-27 MED ORDER — ASCORBIC ACID 500 MG PO TABS: 1000.0000 mg | ORAL_TABLET | Freq: Every day | ORAL | Status: DC

## 2019-09-27 MED ORDER — INSULIN ASPART 100 UNIT/ML ~~LOC~~ SOLN
0.0000 [IU] | SUBCUTANEOUS | Status: DC
  Administered 2019-09-27: 1 [IU] via SUBCUTANEOUS

## 2019-09-27 MED ORDER — LIDOCAINE HCL 1 % IJ SOLN
INTRAMUSCULAR | Status: DC | PRN
  Administered 2019-09-27: 10 mL

## 2019-09-27 MED ORDER — LORATADINE 10 MG PO TABS: 10.0000 mg | ORAL_TABLET | Freq: Every day | ORAL | Status: DC

## 2019-09-27 MED ORDER — PIPERACILLIN-TAZOBACTAM 3.375 G IVPB 30 MIN
3.3750 g | Freq: Once | INTRAVENOUS | Status: AC
  Administered 2019-09-27: 3.375 g via INTRAVENOUS
  Filled 2019-09-27 (×2): qty 50

## 2019-09-27 MED ORDER — OSMOLITE 1.5 CAL PO LIQD
1000.0000 mL | ORAL | Status: DC
  Administered 2019-09-27: 1000 mL

## 2019-09-27 MED ORDER — MIDAZOLAM HCL 2 MG/2ML IJ SOLN
2.0000 mg | Freq: Once | INTRAMUSCULAR | Status: DC
  Filled 2019-09-27: qty 2

## 2019-09-27 MED ORDER — CEFAZOLIN SODIUM-DEXTROSE 2-4 GM/100ML-% IV SOLN
2.0000 g | INTRAVENOUS | Status: AC
  Administered 2019-09-27: 12:00:00 2 g via INTRAVENOUS
  Filled 2019-09-27: qty 100

## 2019-09-27 MED ORDER — FENTANYL 2500MCG IN NS 250ML (10MCG/ML) PREMIX INFUSION: 0.0000 ug/h | INTRAVENOUS | Status: DC

## 2019-09-27 MED ORDER — ATORVASTATIN CALCIUM 20 MG PO TABS
20.0000 mg | ORAL_TABLET | Freq: Every day | ORAL | Status: DC
  Administered 2019-09-27: 20 mg
  Filled 2019-09-27: qty 1

## 2019-09-27 MED ORDER — ASPIRIN 300 MG RE SUPP: 300.0000 mg | Freq: Every day | RECTAL | Status: DC

## 2019-09-27 MED ORDER — LACTATED RINGERS IV SOLN: INTRAVENOUS | Status: DC | PRN

## 2019-09-27 MED ORDER — VITAMIN B-12 1000 MCG PO TABS: 1000.0000 ug | ORAL_TABLET | Freq: Every day | ORAL | Status: DC

## 2019-09-27 MED ORDER — ONDANSETRON HCL 4 MG/2ML IJ SOLN
INTRAMUSCULAR | Status: AC
  Administered 2019-09-27: 4 mg
  Filled 2019-09-27: qty 2

## 2019-09-27 MED ORDER — ONDANSETRON HCL 4 MG/2ML IJ SOLN: 4.0000 mg | Freq: Four times a day (QID) | INTRAMUSCULAR | Status: DC | PRN

## 2019-09-27 MED ORDER — PANTOPRAZOLE SODIUM 40 MG PO PACK
40.0000 mg | PACK | Freq: Every day | ORAL | Status: DC
  Administered 2019-09-27: 40 mg
  Filled 2019-09-27: qty 20

## 2019-09-27 MED ORDER — VITAMIN D 25 MCG (1000 UNIT) PO TABS: 1000.0000 [IU] | ORAL_TABLET | Freq: Every day | ORAL | Status: DC

## 2019-09-27 MED ORDER — FREE WATER
200.0000 mL | Freq: Four times a day (QID) | Status: DC
  Administered 2019-09-27 (×2): 200 mL

## 2019-09-27 MED ORDER — PHENYLEPHRINE HCL-NACL 10-0.9 MG/250ML-% IV SOLN: 0.0000 ug/min | INTRAVENOUS | Status: DC

## 2019-09-27 MED ORDER — FENTANYL CITRATE (PF) 100 MCG/2ML IJ SOLN: 25.0000 ug | INTRAMUSCULAR | Status: DC | PRN

## 2019-09-27 MED ORDER — LIDOCAINE HCL (CARDIAC) PF 100 MG/5ML IV SOSY
PREFILLED_SYRINGE | INTRAVENOUS | Status: DC | PRN
  Administered 2019-09-27: 60 mg via INTRATRACHEAL

## 2019-09-27 MED ORDER — SODIUM CHLORIDE 0.9 % IV BOLUS
1000.0000 mL | Freq: Once | INTRAVENOUS | Status: AC
  Administered 2019-09-27: 1000 mL via INTRAVENOUS

## 2019-09-27 MED ORDER — ASPIRIN 81 MG PO CHEW
81.0000 mg | CHEWABLE_TABLET | Freq: Every day | ORAL | Status: DC
  Administered 2019-09-27: 81 mg
  Filled 2019-09-27: qty 1

## 2019-09-27 MED ORDER — PROPOFOL 10 MG/ML IV BOLUS
INTRAVENOUS | Status: DC | PRN
  Administered 2019-09-27 (×2): 30 mg via INTRAVENOUS

## 2019-09-27 MED ORDER — CLOPIDOGREL BISULFATE 75 MG PO TABS: 75.0000 mg | ORAL_TABLET | Freq: Every day | ORAL | Status: DC

## 2019-09-27 MED ORDER — LIDOCAINE 2% (20 MG/ML) 5 ML SYRINGE
INTRAMUSCULAR | Status: AC
  Filled 2019-09-27: qty 5

## 2019-09-27 MED ORDER — WATER FOR IRRIGATION, STERILE IR SOLN
Status: DC | PRN
  Administered 2019-09-27: 500 mL

## 2019-09-27 MED ORDER — CHLORHEXIDINE GLUCONATE CLOTH 2 % EX PADS
6.0000 | MEDICATED_PAD | Freq: Every day | CUTANEOUS | Status: DC
  Administered 2019-09-27 – 2019-09-28 (×2): 6 via TOPICAL

## 2019-09-27 MED ORDER — NOREPINEPHRINE 4 MG/250ML-% IV SOLN
0.0000 ug/min | INTRAVENOUS | Status: DC
  Administered 2019-09-27: 15 ug/min via INTRAVENOUS
  Filled 2019-09-27: qty 250

## 2019-09-27 MED ORDER — MIDAZOLAM HCL 2 MG/2ML IJ SOLN
INTRAMUSCULAR | Status: AC
  Administered 2019-09-27: 2 mg via INTRAVENOUS
  Filled 2019-09-27: qty 2

## 2019-09-27 MED ORDER — MIDAZOLAM HCL 2 MG/2ML IJ SOLN
2.0000 mg | Freq: Once | INTRAMUSCULAR | Status: AC
  Administered 2019-09-27: 2 mg via INTRAVENOUS

## 2019-09-27 MED ORDER — MIDAZOLAM HCL 2 MG/2ML IJ SOLN: 2.0000 mg | Freq: Once | INTRAMUSCULAR | Status: AC

## 2019-09-27 MED ORDER — MORPHINE SULFATE (PF) 2 MG/ML IV SOLN: 1.0000 mg | INTRAVENOUS | Status: DC | PRN

## 2019-09-27 MED ORDER — MUPIROCIN 2 % EX OINT
1.0000 "application " | TOPICAL_OINTMENT | Freq: Two times a day (BID) | CUTANEOUS | Status: DC
  Administered 2019-09-27 (×2): 1 via NASAL
  Filled 2019-09-27 (×2): qty 22

## 2019-09-27 MED ORDER — PRO-STAT SUGAR FREE PO LIQD: 30.0000 mL | Freq: Every day | ORAL | Status: DC

## 2019-09-27 MED ORDER — CLOPIDOGREL BISULFATE 75 MG PO TABS
75.0000 mg | ORAL_TABLET | Freq: Every day | ORAL | Status: DC
  Administered 2019-09-27: 75 mg
  Filled 2019-09-27: qty 1

## 2019-09-27 MED ORDER — FENTANYL CITRATE (PF) 100 MCG/2ML IJ SOLN
INTRAMUSCULAR | Status: AC
  Administered 2019-09-27: 50 ug via INTRAVENOUS
  Filled 2019-09-27: qty 2

## 2019-09-27 MED ORDER — SODIUM CHLORIDE 0.9 % IV BOLUS
1000.0000 mL | Freq: Once | INTRAVENOUS | Status: AC
  Administered 2019-09-28: 1000 mL via INTRAVENOUS

## 2019-09-27 MED ORDER — SODIUM CHLORIDE 0.9 % IV SOLN
0.0000 ug/h | INTRAVENOUS | Status: DC
  Administered 2019-09-27: 25 ug/h via INTRAVENOUS
  Filled 2019-09-27: qty 50

## 2019-09-27 MED ORDER — OSMOLITE 1.2 CAL PO LIQD: 1000.0000 mL | ORAL | Status: DC

## 2019-09-27 MED ORDER — PANTOPRAZOLE SODIUM 40 MG IV SOLR
40.0000 mg | INTRAVENOUS | Status: DC
  Administered 2019-09-27: 40 mg via INTRAVENOUS
  Filled 2019-09-27: qty 40

## 2019-09-27 MED ORDER — FENTANYL CITRATE (PF) 100 MCG/2ML IJ SOLN: 50.0000 ug | Freq: Once | INTRAMUSCULAR | Status: AC

## 2019-09-27 MED ORDER — FENTANYL CITRATE (PF) 2500 MCG/50ML IJ SOLN
INTRAMUSCULAR | Status: AC
  Filled 2019-09-27: qty 50

## 2019-09-27 MED ORDER — SIMVASTATIN 20 MG PO TABS: 20.0000 mg | ORAL_TABLET | Freq: Every day | ORAL | Status: DC

## 2019-09-27 MED ORDER — MIDAZOLAM 50MG/50ML (1MG/ML) PREMIX INFUSION
0.5000 mg/h | INTRAVENOUS | Status: DC
  Administered 2019-09-27: 0.5 mg/h via INTRAVENOUS
  Filled 2019-09-27: qty 50

## 2019-09-27 SURGICAL SUPPLY — 8 items
CLOTH BEACON ORANGE TIMEOUT ST (SAFETY) ×3 IMPLANT
GLOVE BIOGEL PI IND STRL 6.5 (GLOVE) ×1 IMPLANT
GLOVE BIOGEL PI INDICATOR 6.5 (GLOVE) ×2
GLOVE SURG SS PI 7.5 STRL IVOR (GLOVE) ×3 IMPLANT
KIT PEG SAFETY 20FR (KITS) ×3 IMPLANT
MANIFOLD NEPTUNE II (INSTRUMENTS) ×3 IMPLANT
SPONGE DRAIN TRACH 4X4 STRL 2S (GAUZE/BANDAGES/DRESSINGS) ×2 IMPLANT
TAPE CLOTH SURG 4X10 WHT LF (GAUZE/BANDAGES/DRESSINGS) ×2 IMPLANT

## 2019-09-27 NOTE — Transfer of Care (Signed)
Immediate Anesthesia Transfer of Care Note  Patient: Donald Conley  Procedure(s) Performed: ESOPHAGOGASTRODUODENOSCOPY (EGD) WITH PROPOFOL (N/A ) PERCUTANEOUS ENDOSCOPIC GASTROSTOMY (PEG) PLACEMENT (N/A )  Patient Location: PACU  Anesthesia Type:MAC  Level of Consciousness: awake, oriented and patient cooperative  Airway & Oxygen Therapy: Patient Spontanous Breathing and Patient connected to face mask oxygen  Post-op Assessment: Report given to RN and Post -op Vital signs reviewed and stable  Post vital signs: Reviewed and stable  Last Vitals:  Vitals Value Taken Time  BP 143/62 09/27/19 1235  Temp 37.3 C 09/27/19 1235  Pulse 101 09/27/19 1240  Resp 29 09/27/19 1240  SpO2 98 % 09/27/19 1240  Vitals shown include unvalidated device data.  Last Pain:  Vitals:   09/27/19 1235  TempSrc:   PainSc: Asleep      Patients Stated Pain Goal: (unable to assess) (32/25/67 2091)  Complications: No apparent anesthesia complications

## 2019-09-27 NOTE — Interval H&P Note (Signed)
History and Physical Interval Note:  09/27/2019 11:25 AM  Donald Conley  has presented today for surgery, with the diagnosis of dysphagia.  The various methods of treatment have been discussed with the patient and family. After consideration of risks, benefits and other options for treatment, the patient has consented to  Procedure(s): ESOPHAGOGASTRODUODENOSCOPY (EGD) WITH PROPOFOL (N/A) PERCUTANEOUS ENDOSCOPIC GASTROSTOMY (PEG) PLACEMENT (N/A) as a surgical intervention.  The patient's history has been reviewed, patient examined, no change in status, stable for surgery.  I have reviewed the patient's chart and labs.  Questions were answered to the patient's satisfaction.     Franky Macho

## 2019-09-27 NOTE — Significant Event (Addendum)
84 y.o.malewith a past medical history significant for allergy rhinitis, gastroesophageal reflux disease, hypertension, hyperlipidemia, type 2 diabetes mellitus and prior history of nonhemorrhagic CVA admitted with probable brainstem stroke. RRT called at 2005 due to AMS, hypoxia.  Patient was found to be in respiratory arrest with significant hypoxia with saturation dropping into the 70s.  He had a weak gag reflex.  Had been in the OR earlier in the day for PEG tube placement.  He had apparently been drowsy following the procedure.  He was not responsive at the time of our evaluation.  Due to persistent hypoxia and inability to protect the airway, the patient was emergently intubated at the bedside using a size 8 ET tube.  Patient was intubated on the first attempt however history subsequently had a significant amount of emesis which was suctioned rapidly.  There was concern for aspiration.  Patient was immediately transferred to the ICU.  He was noted to be significantly hypotensive with blood pressure in the 60s systolic.  He was given 2 L of normal saline fluid bolus and briefly started on norepinephrine drip.  Repeat labs showed mild lactic acidosis with slightly worsened renal function.  Repeat CT head was done which only showed chronic atrophy with no acute abnormalities.  Patient's family was at the bedside.  His daughter is the power of attorney.  She reported that the patient wished to be a DNR and she did not want any significant aggressive interventions but would be agreeable to her central line placement if needed over the next 24 hours. ABG showed mild respiratory acidosis.  Chest x-ray showed tube in place.  OG tube was also placed. Patient will be continued on mechanical ventilation.  Will monitor labs.  Total critical care time spent 75 minutes including time at the bedside, discussion with family, review of chart, discussion with nursing staff.

## 2019-09-27 NOTE — Progress Notes (Signed)
Pharmacy Antibiotic Note  Donald Conley is a 84 y.o. male admitted on 09/21/2019 with pneumonia.  Pharmacy has been consulted for zosyn dosing.  Pt was admitted from from for brainstem stroke. He coded tonight and was tx to the ICU. Pt has significant emesis during intubation. Zosyn has been ordered for PNA/sepsis. His recent MRSA screen was neg.   Scr<1 CrCl ~80   Plan: Zosyn 3.375g IV x1 over 30 minutes then 3.375h IV q8 extended infusion F/u with cultures  Height: 5' 10.98" (180.3 cm) Weight: 90.7 kg (200 lb) IBW/kg (Calculated) : 75.26  Temp (24hrs), Avg:99 F (37.2 C), Min:98.3 F (36.8 C), Max:99.8 F (37.7 C)  Recent Labs  Lab 09/21/19 1027 09/22/19 0455 09/27/19 0857 09/27/19 2118  WBC 9.8 11.0* 8.9 14.2*  CREATININE 0.79 0.61 0.72  --     Estimated Creatinine Clearance: 82.1 mL/min (by C-G formula based on SCr of 0.72 mg/dL).    No Known Allergies  Antimicrobials this admission: 4/21 zosyn>>  Dose adjustments this admission:  Microbiology results: 4/15 COVID neg 4/15 flu neg 4/21 urine>>  Ulyses Southward, PharmD, [place], AAHIVP, CPP Infectious Disease Pharmacist 09/27/2019 9:42 PM

## 2019-09-27 NOTE — Progress Notes (Signed)
Entered the patients room to do shift assessment.  Patient not responding to voice or sternal rub.  Charge nurse called to the room and vitals taken.  Vitals stable, patient still unresponsive.  Hospitalist notified new orders given.  After hanging up with the MD patient immediately desats to  84% and respirations became shallow.  Rapid response called, resulting in intubation and transfer to critical care.

## 2019-09-27 NOTE — Op Note (Signed)
Patient:  Donald Conley  DOB:  [birth year]  MRN:  [id number]   Preop Diagnosis:  Dysphgia, CVA   Postop Diagnosis: Same  Procedure: EGD with PEG  Surgeon: Aviva Signs, MD  Assistant: Curlene Labrum, MD  Anes: MAC  Indications: Patient is an 84 year old white male who suffers from dysphagia secondary to an acute CVA.  The risks and benefits of the procedure including bleeding, infection, and organ injury were fully explained to the patient and his daughter, who gave informed consent for the patient as she has power of attorney.  Procedure note: The patient was placed in supine position.  After monitored anesthesia care was given, the endoscope was advanced down to the second portion of the duodenum without difficulty.  The first and second portions of the duodenum were within normal limits.  The pylorus was noted to be widely patent.  There was evidence of multiple gastric polyps and gastritis in the antrum.  On retroflexion of the endoscope, no significant fixed hiatal hernia was noted.  The abdomen was prepped using ChloraPrep.  An area was palpated just to the left of the epigastrium.  1% Xylocaine was used for local anesthesia of the lesion.  A needle catheter was then inserted under direct visualization into the distal portion of the stomach without difficulty.  A guidewire was advanced and this was grasped using a snare.  The endoscope along with the wire was then retracted with the endoscope.  A 20 French gastrostomy tube was then attached to the wire and using the pull technique, it was pulled through the mouth, esophagus, stomach, and the abdominal wall to the 3-1/2 cm [name] at the skin level.  A bolster was then applied.  The endoscope was advanced back down into the stomach and adequate positioning was confirmed.  All air was then evacuated from the stomach prior to removal of the endoscope.  Betadine ointment and dry sterile dressing were applied around the bolster of the PEG  tube.  The patient tolerated the procedure well and was transferred to PACU in stable condition.  Complications:  None   EBL:  minimal  Specimen:  none

## 2019-09-27 NOTE — ED Provider Notes (Signed)
[facility]  Department of Emergency Medicine   Code Blue CONSULT NOTE  Chief Complaint: Cardiac arrest/unresponsive   Level V Caveat: Unresponsive  History of present illness: I was contacted by the hospital for a CODE BLUE cardiac arrest upstairs and presented to the patient's bedside.  I was called to the patient's bedside due to respiratory arrest, the report from bedside nursing and physician is that this patient has been admitted for a brainstem stroke and has had a progressive decline in functioning today, on arrival the patient is not breathing  ROS: Unable to obtain, Level V caveat  Scheduled Meds: . [START ON 09/28/2019] vitamin C  1,000 mg Per Tube Daily  . aspirin  81 mg Per Tube Daily  . atorvastatin  20 mg Per Tube Daily  . Chlorhexidine Gluconate Cloth  6 each Topical Q0600  . [START ON 09/28/2019] cholecalciferol  1,000 Units Per Tube Daily  . clopidogrel  75 mg Per Tube Daily  . doxazosin  8 mg Per Tube q1800  . feeding supplement (PRO-STAT SUGAR FREE 64)  30 mL Per Tube Daily  . finasteride  5 mg Oral Daily  . fluticasone  1 spray Each Nare Daily  . free water  200 mL Per Tube Q6H  . insulin aspart  0-9 Units Subcutaneous Q4H  . [START ON 09/28/2019] loratadine  10 mg Per Tube Daily  . mupirocin ointment  1 application Nasal BID  . pantoprazole sodium  40 mg Per Tube Daily  . [START ON 09/28/2019] vitamin B-12  1,000 mcg Per Tube Daily   Continuous Infusions: . sodium chloride 50 mL/hr at 09/27/19 1841  . feeding supplement (OSMOLITE 1.5 CAL) 1,000 mL (09/27/19 1521)   PRN Meds:.acetaminophen **OR** acetaminophen (TYLENOL) oral liquid 160 mg/5 mL **OR** acetaminophen, labetalol, morphine injection, senna-docusate Past Medical History:  Diagnosis Date  . Allergy   . Arthritis   . Cataract   . GERD (gastroesophageal reflux disease)    Past Surgical History:  Procedure Laterality Date  . REPLACEMENT TOTAL KNEE BILATERAL    . TOTAL HIP  ARTHROPLASTY Left   . WRIST ARTHROPLASTY Right    Social History   Socioeconomic History  . Marital status: Widowed    Spouse name: Not on file  . Number of children: Not on file  . Years of education: Not on file  . Highest education level: Not on file  Occupational History  . Not on file  Tobacco Use  . Smoking status: Former Smoker    Packs/day: 4.00    Years: 6.00    Pack years: 24.00  . Smokeless tobacco: Never Used  Substance and Sexual Activity  . Alcohol use: Never  . Drug use: Never  . Sexual activity: Not on file  Other Topics Concern  . Not on file  Social History Narrative  . Not on file   Social Determinants of Health   Financial Resource Strain:   . Difficulty of Paying Living Expenses:   Food Insecurity:   . Worried About Programme researcher, broadcasting/film/video in the Last Year:   . Barista in the Last Year:   Transportation Needs:   . Freight forwarder (Medical):   Marland Kitchen Lack of Transportation (Non-Medical):   Physical Activity:   . Days of Exercise per Week:   . Minutes of Exercise per Session:   Stress:   . Feeling of Stress :   Social Connections:   . Frequency of Communication with Friends and  Family:   . Frequency of Social Gatherings with Friends and Family:   . Attends Religious Services:   . Active Member of Clubs or Organizations:   . Attends Archivist Meetings:   Marland Kitchen Marital Status:   Intimate Partner Violence:   . Fear of Current or Ex-Partner:   . Emotionally Abused:   Marland Kitchen Physically Abused:   . Sexually Abused:    No Known Allergies  Last set of Vital Signs (not current) Vitals:   09/27/19 1851 09/27/19 2026  BP: (!) 141/58 (!) 67/45  Pulse: (!) 107 77  Resp: 16   Temp: 99.8 F (37.7 C)   SpO2: 97% 96%      Physical Exam Ill-appearing Gen: unresponsive Cardiovascular: pulse is present, the patient is apneic. Resp: apneic. Breath sounds equal bilaterally with bagging  Abd: nondistended  Neuro: GCS 3, unresponsive to  pain  HEENT: No blood in posterior pharynx, gag reflex absent  Neck: No crepitus  Musculoskeletal: No deformity  Skin: warm  Procedures  INTUBATION Performed by: Johnna Acosta Required items: required blood products, implants, devices, and special equipment available Patient identity confirmed: provided demographic data and hospital-assigned identification number Time out: Immediately prior to procedure a "time out" was called to verify the correct patient, procedure, equipment, support staff and site/side marked as required. Indications: Apneic, no gag reflex Intubation method: Direct laryngoscopy Preoxygenation: BVM Sedatives: None Paralytic: None Tube Size: 8.0 cuffed Post-procedure assessment: chest rise and ETCO2 monitor Breath sounds: equal and absent over the epigastrium Tube secured by Respiratory Therapy Patient tolerated the procedure well with no immediate complications.    Medical Decision making  The patient was successfully intubated on the first attempt, he did have a significant amount emesis on intubation, this was suctioned rapidly, it did not appear that he had much aspiration.  Hospitalist at the bedside to continue resuscitative efforts of this patient.  Patient has pulses at this time  Assessment and Plan  Transition of care to ICU team for further care and management of this very critically ill patient    Noemi Chapel, MD 09/27/19 2030

## 2019-09-27 NOTE — Progress Notes (Signed)
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 Patient woke up earlier not noticing any significant abnormality,he used the bathroom and then went to bed again; after waking up and trying to eat breakfast noticing difficulty swallowing and at the moment was noted by family members difficulty on his speech and left facial droop. Patient was brought to the hospital for further evaluation and management. Of note, patient reported while attempting to eat experiencing some nausea and vomiting after having difficulty passing the food from the back of his throat; it was just the content of the food that he was trying to wait seen in the vomitus. Patient also expressed having some left-sided weakness. No fever, no chest pain, no palpitation, no dysuria, no hematuria, no cough, no shortness of breath, no melena, no hematochezia or any other complaints.  In the ED work-up demonstrated CT scan of the head negative for acute intracranial normalities, no signs of acute infection on her chest x-ray; UA without signs of infection, normal electrolytes, renal function WBCs and hemoglobin. No EKGs abnormalities. Neurology was consulted which recommended admission for TIA/CVA work-up. TRH was consulted to facilitate further evaluation and management  Assessment/Plan: nonhemorrhagic brainstem stroke -Continue to have difficulty swallowing, right facial droop poor balance slurred speech -Concerns for brainstem stroke non visible on  MRI. -appreciate neurology assistance and recommendations. -Borderline B12 deficiency appreciated and repletion has been started -Speech therapy has performed MBS and half outpatient with severe pharyngeal dysphagia; there is anticipation that this will improve with rehabilitation but unclear what is the timeframe for that to happen.  Alternate route for nutrition, hydration and medications recommended (PEG tube placement).  General surgery has been consulted for PEG placement-PEG placed on 09/27/19. -Continue aspirin and plavix -Physical therapy recommending skilled nursing facility for rehabilitation -CTA H&N--no LVO; high grade stenosis of R-vertebral artery -ECHo--no PFO; EF 60-65%, no WMA -TSH within normal limits. -Plan is for dual antiplatelet therapy using full dose aspirin and Plavix; subsequently plan is for him to pursued ASA  Acute Respiratory Failure with Hypoxia -new oxygen requirement after PEG -CXR -concerned asbout aspiration -stable on 3-4 L  Essential hypertension -Blood pressure has remained stable -Continue to allow permissive hypertension   BPH -continue Proscar and Cardura unable to tolerate p.o.'s.  hyperlipidemia -LDL 110. -change zocor to atorvastatin  type 2 diabetes mellitus (chronically not on insulin therapy) -Continue holding oral metformin -A1c 6.6 demonstrating good control -Continue sliding scale insulin with close CBG monitoring while NPO.  GERD -Continue PPI;        Disposition Plan: Patient From: Home D/C Place: SNF - 1-2  Days Barriers: Not Clinically Stable--new oxygen requirement; yet to demonstrate tolerance to TF  Family Communication:   Daughter updated at bedside 4/21  Consultants:  neurology  Code Status:  FULL  DVT Prophylaxis:  [place] Heparin    Procedures: As Listed in Progress Note Above  Antibiotics: None       Subjective: Patient denies fevers, chills, headache, chest pain, dyspnea, nausea,  vomiting, diarrhea, abdominal pain, dysuria, hematuria, hematochezia, and melena.  Objective: Vitals:   09/27/19 1124 09/27/19 1235 09/27/19 1245 09/27/19 1254  BP: (!) 172/93 (!) 143/62 (!) 154/73 (!) 154/73  Pulse: (!) 107 (!) 107 99 (!) 103  Resp: 16 (!) 28 12 16   Temp: 98.3 F (36.8 C) 99.1 F (37.3 C)    TempSrc: Oral     SpO2: 97% 99% 99% 94%  Weight:      Height:        Intake/Output Summary (Last 24 hours) at 09/27/2019 1353 Last data filed at 09/27/2019 1239 Gross per 24 hour  Intake 450 ml  Output 510 ml  Net -60 ml   Weight change:  Exam:   General:  Pt is alert, follows commands appropriately, not in acute distress  HEENT: No icterus, No thrush, No neck mass, [place]/AT  Cardiovascular: RRR, S1/S2, no rubs, no gallops  Respiratory: diminished BS at bases.  No wheeze.  Bibasilar rales  Abdomen: Soft/+BS, non tender, non distended, no guarding  Extremities: No edema, No lymphangitis, No petechiae, No rashes, no synovitis   Data Reviewed: I have personally reviewed following labs and imaging studies Basic Metabolic Panel: Recent Labs  Lab 09/21/19 1027 09/22/19 0455 09/27/19 0857  NA 138 140 139  K 3.7 3.4* 3.7  CL 99 104 102  CO2 26 26 24   GLUCOSE 218* 122* 122*  BUN 11 10 15   CREATININE 0.79 0.61 0.72  CALCIUM 9.2 8.7* 8.3*  MG  --   --  2.3   Liver Function Tests: Recent Labs  Lab 09/21/19 1027  AST 19  ALT 19  ALKPHOS 46  BILITOT 1.1  PROT 7.4  ALBUMIN 4.1   Recent Labs  Lab 09/21/19 1027  LIPASE 22   No results for input(s): AMMONIA in the last 168 hours. Coagulation Profile: No results for input(s): INR, PROTIME in the last 168 hours. CBC: Recent Labs  Lab 09/21/19 1027 09/22/19 0455 09/27/19 0857  WBC 9.8 11.0* 8.9  NEUTROABS 8.0*  --   --   HGB 15.3 14.1 15.9  HCT 45.5 41.5 46.8  MCV 88.9 88.9 89.0  PLT 257 263 286   Cardiac Enzymes: No results for input(s): CKTOTAL, CKMB, CKMBINDEX, TROPONINI in the last 168  hours. BNP: Invalid input(s): POCBNP CBG: Recent Labs  Lab 09/26/19 1652 09/26/19 2029 09/27/19 0257 09/27/19 0729 09/27/19 1138  GLUCAP 96 108* 109* 120* 121*   HbA1C: No results for input(s): HGBA1C in the last 72 hours. Urine analysis:    Component Value Date/Time   COLORURINE YELLOW 03/30/2019 0916   APPEARANCEUR CLEAR 03/30/2019 0916   APPEARANCEUR Clear 04/10/2013 1155   LABSPEC 1.020 03/30/2019 0916   LABSPEC 1.025 04/10/2013 1155   PHURINE 5.0 03/30/2019 0916   GLUCOSEU 50 (A) 03/30/2019 0916   GLUCOSEU Negative 04/10/2013 1155   HGBUR NEGATIVE 03/30/2019 0916   BILIRUBINUR NEGATIVE 03/30/2019 0916   BILIRUBINUR Negative 04/10/2013 1155   KETONESUR NEGATIVE 03/30/2019 0916   PROTEINUR NEGATIVE 03/30/2019 0916   NITRITE NEGATIVE 03/30/2019 0916   LEUKOCYTESUR NEGATIVE 03/30/2019 0916   LEUKOCYTESUR Negative 04/10/2013 1155   Sepsis Labs: @LABRCNTIP (procalcitonin:4,lacticidven:4) ) Recent Results (from the past 240 hour(s))  Respiratory Panel by RT PCR (Flu A&B, Covid) - Nasopharyngeal Swab     Status: None   Collection Time: 09/21/19 10:53 AM   Specimen: Nasopharyngeal Swab  Result Value Ref Range Status   SARS Coronavirus 2 by RT PCR NEGATIVE NEGATIVE Final    Comment: (NOTE) SARS-CoV-2 target nucleic acids are NOT DETECTED. The SARS-CoV-2 RNA  is generally detectable in upper respiratoy specimens during the acute phase of infection. The lowest concentration of SARS-CoV-2 viral copies this assay can detect is 131 copies/mL. A negative result does not preclude SARS-Cov-2 infection and should not be used as the sole basis for treatment or other patient management decisions. A negative result may occur with  improper specimen collection/handling, submission of specimen other than nasopharyngeal swab, presence of viral mutation(s) within the areas targeted by this assay, and inadequate number of viral copies (<131 copies/mL). A negative result must be  combined with clinical observations, patient history, and epidemiological information. The expected result is Negative. Fact Sheet for Patients:  https://www.moore.com/ Fact Sheet for Healthcare Providers:  https://www.young.biz/ This test is not yet ap proved or cleared by the [place] FDA and  has been authorized for detection and/or diagnosis of SARS-CoV-2 by FDA under an Emergency Use Authorization (EUA). This EUA will remain  in effect (meaning this test can be used) for the duration of the COVID-19 declaration under Section 564(b)(1) of the Act, 21 U.S.C. section 360bbb-3(b)(1), unless the authorization is terminated or revoked sooner.    Influenza A by PCR NEGATIVE NEGATIVE Final   Influenza B by PCR NEGATIVE NEGATIVE Final    Comment: (NOTE) The Xpert Xpress SARS-CoV-2/FLU/RSV assay is intended as an aid in  the diagnosis of influenza from Nasopharyngeal swab specimens and  should not be used as a sole basis for treatment. Nasal washings and  aspirates are unacceptable for Xpert Xpress SARS-CoV-2/FLU/RSV  testing. Fact Sheet for Patients: https://www.moore.com/ Fact Sheet for Healthcare Providers: https://www.young.biz/ This test is not yet approved or cleared by the [place] FDA and  has been authorized for detection and/or diagnosis of SARS-CoV-2 by  FDA under an Emergency Use Authorization (EUA). This EUA will remain  in effect (meaning this test can be used) for the duration of the  Covid-19 declaration under Section 564(b)(1) of the Act, 21  U.S.C. section 360bbb-3(b)(1), unless the authorization is  terminated or revoked. Performed at [facility], [address], [place], [place] [address]   Surgical pcr screen     Status: Abnormal   Collection Time: 09/26/19  9:09 PM   Specimen: Nasal Mucosa; Nasal Swab  Result Value Ref Range Status   MRSA, PCR NEGATIVE NEGATIVE Final    Staphylococcus aureus POSITIVE (A) NEGATIVE Final    Comment: (NOTE) The Xpert SA Assay (FDA approved for NASAL specimens in patients 55 years of age and older), is one component of a comprehensive surveillance program. It is not intended to diagnose infection nor to guide or monitor treatment. Performed at [facility], [address], [place], [place] [address]      Scheduled Meds: . [START ON 09/28/2019] vitamin C  1,000 mg Per Tube Daily  . aspirin  300 mg Rectal Daily  . Chlorhexidine Gluconate Cloth  6 each Topical Q0600  . [START ON 09/28/2019] cholecalciferol  1,000 Units Per Tube Daily  . clopidogrel  75 mg Per Tube Daily  . doxazosin  8 mg Per Tube q1800  . feeding supplement (PRO-STAT SUGAR FREE 64)  30 mL Per Tube Daily  . finasteride  5 mg Oral Daily  . fluticasone  1 spray Each Nare Daily  . free water  200 mL Per Tube Q6H  . insulin aspart  0-5 Units Subcutaneous QHS  . insulin aspart  0-9 Units Subcutaneous TID WC  . [START ON 09/28/2019] loratadine  10 mg Per Tube Daily  . mupirocin ointment  1 application Nasal BID  . pantoprazole (PROTONIX) IV  40 mg Intravenous Q24H  . [START ON 09/28/2019] simvastatin  20 mg Per Tube q1800  . [START ON 09/28/2019] vitamin B-12  1,000 mcg Per Tube Daily   Continuous Infusions: . sodium chloride 50 mL/hr at 09/26/19 1754  . feeding supplement (OSMOLITE 1.5 CAL)      Procedures/Studies: CT ANGIO HEAD W OR WO CONTRAST  Result Date: 09/22/2019 CLINICAL DATA:  Ataxia, stroke suspected. Limb drift. Right-sided facial numbness. Right-sided weakness. Abnormal speech. EXAM: CT ANGIOGRAPHY HEAD AND NECK TECHNIQUE: Multidetector CT imaging of the head and neck was performed using the standard protocol during bolus administration of intravenous contrast. Multiplanar CT image reconstructions and MIPs were obtained to evaluate the vascular anatomy. Carotid stenosis measurements (when applicable) are obtained utilizing NASCET criteria, using the  distal internal carotid diameter as the denominator. CONTRAST:  OMNIPAQUE IOHEXOL 350 MG/ML SOLN COMPARISON:  CT head without contrast 09/22/19. FINDINGS: CTA NECK FINDINGS Aortic arch: A 3 vessel arch configuration is present. Atherosclerotic changes are noted within the arch and origins the great vessels without aneurysm focal stenosis. Right carotid system: Atherosclerotic changes are present at the right carotid bifurcation. Minimal luminal diameter is 3.5 mm. Mild tortuosity is present cervical right ICA without other significant stenosis. Left carotid system: The left common carotid artery within normal limits. Atherosclerotic calcifications are present at the left carotid bifurcation proximal left ICA. Minimal luminal diameter is 3 mm. Cervical left ICA is otherwise normal Vertebral arteries: Left vertebral artery is the dominant vessel. Both vertebral arteries originate from the subclavian arteries. High-grade stenosis is present the origin of the right vertebral artery. The right vertebral artery is occluded the V3 segment. Left vertebral artery is intact. Skeleton: Cervical spine is fused C4-7. Grade 1 anterolisthesis is present at C3-4. Alignment is otherwise intact. Vertebral body heights are normal. No focal lytic or blastic lesions are present Other neck: The soft tissues the neck are unremarkable. Upper chest: Lung apices are clear. Thoracic inlet is within normal limits. Review of the MIP images confirms the above findings CTA HEAD FINDINGS Anterior circulation: Atherosclerotic calcifications are present within the cavernous internal carotid arteries bilaterally. No significant stenosis is present through the ICA termini. The A1 and M1 segments are normal. The anterior communicating artery is patent. MCA bifurcations are intact bilaterally. The ACA and MCA branch vessels are normal. Posterior circulation: Left vertebral artery feeds basilar. The V4 segment is reconstituted into the right AICA.  The basilar artery is normal. Both posterior cerebral arteries originate from basilar tip. The PCA branch vessels are within bilaterally. Venous sinuses: The dural sinuses are patent. Straight sinus deep cerebral veins are intact. Cortical veins are unremarkable. No vascular malformations are present. Anatomic variants: None Review of the MIP images confirms the above findings IMPRESSION: 1. 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 2. The left vertebral artery is dominant. 3. The basilar artery is reconstituted into the right AICA. 4. Atherosclerotic changes at the carotid bifurcations bilaterally without significant stenosis. 5. No significant proximal stenosis, aneurysm, or branch vessel occlusion within the Circle of Willis. Electronically Signed   By: Marin Roberts M.D.   On: 09/22/2019 21:54   CT HEAD WO CONTRAST  Result Date: 09/22/2019 CLINICAL DATA:  Slurred speech. Dizziness EXAM: CT HEAD WITHOUT CONTRAST TECHNIQUE: Contiguous axial images were obtained from the base of the skull through  the vertex without intravenous contrast. COMPARISON:  September 21, 2019. FINDINGS: Brain: Mild chronic ischemic white matter disease is noted. No mass effect or midline shift is noted. Ventricular size is within normal limits. There is no evidence of mass lesion, hemorrhage or acute infarction. Vascular: No hyperdense vessel or unexpected calcification. Skull: Normal. Negative for fracture or focal lesion. Sinuses/Orbits: Minimal right sphenoid sinusitis is noted Other: None. IMPRESSION: Mild chronic ischemic white matter disease. Minimal right sphenoid sinusitis. No acute intracranial abnormality seen.31 Electronically Signed   By: Lupita Raider M.D.   On: 09/22/2019 11:51   CT Head Wo Contrast  Result Date: 09/21/2019 CLINICAL DATA:  Dysphagia EXAM: CT HEAD WITHOUT CONTRAST CT NECK WITHOUT CONTRAST TECHNIQUE:  Contiguous axial images were obtained from the base of the skull through the vertex without contrast. Multidetector CT imaging of the neck was performed using the standard protocol without intravenous contrast. COMPARISON:  MRI brain 03/30/2019 FINDINGS: CT HEAD Brain: There is no acute intracranial hemorrhage mass effect or edema. 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 Gray-white differentiation is otherwise preserved. Confluent areas of hypoattenuation in the supratentorial white matter nonspecific but may reflect moderate to advanced chronic microvascular ischemic changes. There is ex vacuo dilatation lateral ventricles. No extra-axial fluid collection. Vascular: There is intracranial atherosclerotic calcification at the skull base. Skull: Unremarkable. Orbits: Unremarkable. CT NECK Pharynx and larynx: Unremarkable without mass or swelling. Salivary glands: Unremarkable. Thyroid: Subcentimeter hypodense nodule of the left thyroid lobe. No further follow-up required per current guidelines. Lymph nodes: No enlarged lymph nodes. Vascular: Calcified plaque at the common carotid bifurcations. Mastoids and visualized paranasal sinuses: Patchy retained secretions in right sphenoid and posterior ethmoid sinuses. Mild left maxillary sinus mucosal thickening. Included mastoid air cells are clear. Skeleton: Degenerative changes of the included spine. Upper chest: No apical lung mass. Other: None. IMPRESSION: No acute intracranial abnormality. Stable chronic findings detailed above No neck mass or adenopathy. Electronically Signed   By: Guadlupe Spanish M.D.   On: 09/21/2019 13:00   CT Soft Tissue Neck Wo Contrast  Result Date: 09/21/2019 CLINICAL DATA:  Dysphagia EXAM: CT HEAD WITHOUT CONTRAST CT NECK WITHOUT CONTRAST TECHNIQUE: Contiguous axial images were obtained from the base of the skull through the vertex without contrast. Multidetector CT imaging of the neck was performed using the standard protocol without  intravenous contrast. COMPARISON:  MRI brain 03/30/2019 FINDINGS: CT HEAD Brain: There is no acute intracranial hemorrhage mass effect or edema. 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 Gray-white differentiation is otherwise preserved. Confluent areas of hypoattenuation in the supratentorial white matter nonspecific but may reflect moderate to advanced chronic microvascular ischemic changes. There is ex vacuo dilatation lateral ventricles. No extra-axial fluid collection. Vascular: There is intracranial atherosclerotic calcification at the skull base. Skull: Unremarkable. Orbits: Unremarkable. CT NECK Pharynx and larynx: Unremarkable without mass or swelling. Salivary glands: Unremarkable. Thyroid: Subcentimeter hypodense nodule of the left thyroid lobe. No further follow-up required per current guidelines. Lymph nodes: No enlarged lymph nodes. Vascular: Calcified plaque at the common carotid bifurcations. Mastoids and visualized paranasal sinuses: Patchy retained secretions in right sphenoid and posterior ethmoid sinuses. Mild left maxillary sinus mucosal thickening. Included mastoid air cells are clear. Skeleton: Degenerative changes of the included spine. Upper chest: No apical lung mass. Other: None. IMPRESSION: No acute intracranial abnormality. Stable chronic findings detailed above No neck mass or adenopathy. Electronically Signed   By: Guadlupe Spanish M.D.   On: 09/21/2019 13:00   CT ANGIO NECK W  OR WO CONTRAST  Result Date: 09/22/2019 CLINICAL DATA:  Ataxia, stroke suspected. Limb drift. Right-sided facial numbness. Right-sided weakness. Abnormal speech. EXAM: CT ANGIOGRAPHY HEAD AND NECK TECHNIQUE: Multidetector CT imaging of the head and neck was performed using the standard protocol during bolus administration of intravenous contrast. Multiplanar CT image reconstructions and MIPs were obtained to evaluate the vascular anatomy. Carotid stenosis measurements (when applicable) are obtained utilizing  NASCET criteria, using the distal internal carotid diameter as the denominator. CONTRAST:  OMNIPAQUE IOHEXOL 350 MG/ML SOLN COMPARISON:  CT head without contrast 09/22/19. FINDINGS: CTA NECK FINDINGS Aortic arch: A 3 vessel arch configuration is present. Atherosclerotic changes are noted within the arch and origins the great vessels without aneurysm focal stenosis. Right carotid system: Atherosclerotic changes are present at the right carotid bifurcation. Minimal luminal diameter is 3.5 mm. Mild tortuosity is present cervical right ICA without other significant stenosis. Left carotid system: The left common carotid artery within normal limits. Atherosclerotic calcifications are present at the left carotid bifurcation proximal left ICA. Minimal luminal diameter is 3 mm. Cervical left ICA is otherwise normal Vertebral arteries: Left vertebral artery is the dominant vessel. Both vertebral arteries originate from the subclavian arteries. High-grade stenosis is present the origin of the right vertebral artery. The right vertebral artery is occluded the V3 segment. Left vertebral artery is intact. Skeleton: Cervical spine is fused C4-7. Grade 1 anterolisthesis is present at C3-4. Alignment is otherwise intact. Vertebral body heights are normal. No focal lytic or blastic lesions are present Other neck: The soft tissues the neck are unremarkable. Upper chest: Lung apices are clear. Thoracic inlet is within normal limits. Review of the MIP images confirms the above findings CTA HEAD FINDINGS Anterior circulation: Atherosclerotic calcifications are present within the cavernous internal carotid arteries bilaterally. No significant stenosis is present through the ICA termini. The A1 and M1 segments are normal. The anterior communicating artery is patent. MCA bifurcations are intact bilaterally. The ACA and MCA branch vessels are normal. Posterior circulation: Left vertebral artery feeds basilar. The V4 segment is  reconstituted into the right AICA. The basilar artery is normal. Both posterior cerebral arteries originate from basilar tip. The PCA branch vessels are within bilaterally. Venous sinuses: The dural sinuses are patent. Straight sinus deep cerebral veins are intact. Cortical veins are unremarkable. No vascular malformations are present. Anatomic variants: None Review of the MIP images confirms the above findings IMPRESSION: 1. 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 2. The left vertebral artery is dominant. 3. The basilar artery is reconstituted into the right AICA. 4. Atherosclerotic changes at the carotid bifurcations bilaterally without significant stenosis. 5. No significant proximal stenosis, aneurysm, or branch vessel occlusion within the Circle of Willis. Electronically Signed   By: Marin Roberts M.D.   On: 09/22/2019 21:54   MR BRAIN WO CONTRAST  Result Date: 09/21/2019 CLINICAL DATA:  Dizziness, headache EXAM: MRI HEAD WITHOUT CONTRAST TECHNIQUE: Multiplanar, multiecho pulse sequences of the brain and surrounding structures were obtained without intravenous contrast. COMPARISON:  October 2020 FINDINGS: Motion artifact is present. Brain: There is no acute infarction or intracranial hemorrhage. There is no intracranial mass, mass effect, or edema. There is no hydrocephalus or extra-axial fluid collection. Confluent areas of T2 hyperintensity in the supratentorial white matter are nonspecific probably reflect moderate chronic microvascular ischemic changes. 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 Small chronic cerebellar infarcts are  again identified. Prominence of the ventricles and sulci reflects stable parenchymal volume loss. Vascular: Loss of the expected flow void of the intracranial proximal to mid right vertebral artery is again noted. This may reflect chronic occlusion or slow flow. Major  vessel flow voids at the skull base are otherwise preserved. Skull and upper cervical spine: Normal marrow signal is preserved. Sinuses/Orbits: Mild mucosal thickening.  Orbits are unremarkable. Other: Sella is unremarkable.  Mastoid air cells are clear. IMPRESSION: No evidence of recent infarction, hemorrhage, or mass. Stable chronic findings detailed above. Electronically Signed   By: Guadlupe Spanish M.D.   On: 09/21/2019 16:59   US Carotid Bilateral (at [facility] and [facility] only)  Result Date: 09/21/2019 CLINICAL DATA:  84 year old male with a history of dizziness EXAM: BILATERAL CAROTID DUPLEX ULTRASOUND TECHNIQUE: Wallace Cullens scale imaging, color Doppler and duplex ultrasound were performed of bilateral carotid and vertebral arteries in the neck. COMPARISON:  None. FINDINGS: Criteria: Quantification of carotid stenosis is based on velocity parameters that correlate the residual internal carotid diameter with NASCET-based stenosis levels, using the diameter of the distal internal carotid lumen as the denominator for stenosis measurement. The following velocity measurements were obtained: RIGHT ICA:  Systolic 103 cm/sec, Diastolic 23 cm/sec CCA:  106 cm/sec SYSTOLIC ICA/CCA RATIO:  0.97 ECA:  120 cm/sec LEFT ICA:  Systolic 124 cm/sec, Diastolic 26 cm/sec CCA:  91 cm/sec SYSTOLIC ICA/CCA RATIO:  1.3 ECA:  93 cm/sec Right Brachial SBP: Not acquired Left Brachial SBP: Not acquired RIGHT CAROTID ARTERY: No significant calcifications of the right common carotid artery. Intermediate waveform maintained. Heterogeneous and partially calcified plaque at the right carotid bifurcation. No significant lumen shadowing. Low resistance waveform of the right ICA. Tortuosity RIGHT VERTEBRAL ARTERY: Antegrade flow with low resistance waveform. LEFT CAROTID ARTERY: No significant calcifications of the left common carotid artery. Intermediate waveform maintained. Heterogeneous and partially calcified plaque at the left carotid bifurcation  without significant lumen shadowing. Low resistance waveform of the left ICA. Tortuosity LEFT VERTEBRAL ARTERY:  Antegrade flow with low resistance waveform. IMPRESSION: Color duplex indicates minimal heterogeneous and calcified plaque, with no hemodynamically significant stenosis by duplex criteria in the extracranial cerebrovascular circulation. Signed, Yvone Neu. Reyne Dumas, RPVI [facility] Electronically Signed   By: Gilmer Mor D.O.   On: 09/21/2019 16:22   DG Chest Portable 1 View  Result Date: 09/21/2019 CLINICAL DATA:  Dysphagia EXAM: PORTABLE CHEST 1 VIEW COMPARISON:  10/10/2012.  CT 11/28/2018 FINDINGS: Heart is normal size. No confluent opacities, effusions or edema. No acute bony abnormality. IMPRESSION: No active disease. Electronically Signed   By: Charlett Nose M.D.   On: 09/21/2019 10:56   DG Swallowing Func-Speech Pathology  Result Date: 09/25/2019 Objective Swallowing Evaluation: Type of Study: MBS-Modified Barium Swallow Study  Patient Details Name: Jacquese Hackman MRN: [id number] Date of Birth: 05-20-1936 Today's Date: 09/25/2019 Time: SLP Start Time (ACUTE ONLY): 1224 -SLP Stop Time (ACUTE ONLY): 1257 SLP Time Calculation (min) (ACUTE ONLY): 33 min Past Medical History: Past Medical History: Diagnosis Date . Allergy  . Arthritis  . Cataract  . GERD (gastroesophageal reflux disease)  Past Surgical History: Past Surgical History: Procedure Laterality Date . REPLACEMENT TOTAL KNEE BILATERAL   . TOTAL HIP ARTHROPLASTY Left  . WRIST ARTHROPLASTY Right  HPI: Donald Conley is a 84 y.o. 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  Patient woke up earlier not noticing any significant abnormality,but after waking up and trying to eat breakfast noticing difficulty swallowing and at the moment was noted by family members difficulty on his speech and left facial droop.  Patient was brought to the hospital for further evaluation and management. MRI reveals "No evidence of recent infarction, hemorrhage, or mass. Stable chronic findings detailed above."  Subjective: "They gave me a suction in the room." Assessment / Plan / Recommendation CHL IP CLINICAL IMPRESSIONS 09/25/2019 Clinical Impression Pt presents with functionally severe pharyngeal phase dysphagia characterized by reduced tongue base retraction, epiglottic deflection, pharyngeal stripping, and UES relaxation resulting in severe pharyngeal residuals with variable episodes of aspiration (generally sensed with cough/throat clear) during and after the swallow. Pt with improved pharyngeal clearance (however not consistent) with head turn to the RIGHT with chin tuck for all po trials. This appeared to facilitate increased epiglottic deflection, pharyngeal stripping, and UES opening, however was not consistent. Pt with occasional episodes of trace aspiration from residuals with thin and nectar thick liquids. Pt is at risk for aspiration and impaired ability to meet nutrition/hydration needs due to acute stroke. 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 Recommend NPO with dysphagia treatment (ice chips PRN after oral care and trials bites of puree with head turn to the RIGHT).  SLP Visit Diagnosis Dysphagia, pharyngeal phase (R13.13);Dysphagia, pharyngoesophageal phase (R13.14) Attention and concentration deficit following -- Frontal lobe and executive function deficit following -- Impact on  safety and function Moderate aspiration risk;Severe aspiration risk;Risk for inadequate nutrition/hydration   CHL IP TREATMENT RECOMMENDATION 09/25/2019 Treatment Recommendations Therapy as outlined in treatment plan below   Prognosis 09/25/2019 Prognosis for Safe Diet Advancement Guarded Barriers to Reach Goals Severity of deficits Barriers/Prognosis Comment -- CHL IP DIET RECOMMENDATION 09/25/2019 SLP Diet Recommendations NPO;Alternative means - long-term;Ice chips PRN after oral care Liquid Administration via -- Medication Administration Via alternative means Compensations Small sips/bites;Multiple dry swallows after each bite/sip Postural Changes --   CHL IP OTHER RECOMMENDATIONS 09/25/2019 Recommended Consults Consider GI evaluation Oral Care Recommendations Oral care QID Other Recommendations --   CHL IP FOLLOW UP RECOMMENDATIONS 09/25/2019 Follow up Recommendations Skilled Nursing facility   [facility] IP FREQUENCY AND DURATION 09/25/2019 Speech Therapy Frequency (ACUTE ONLY) min 2x/week Treatment Duration 1 week      CHL IP ORAL PHASE 09/25/2019 Oral Phase WFL Oral - Pudding Teaspoon -- Oral - Pudding Cup -- Oral - Honey Teaspoon -- Oral - Honey Cup -- Oral - Nectar Teaspoon -- Oral - Nectar Cup -- Oral - Nectar Straw -- Oral - Thin Teaspoon -- Oral - Thin Cup -- Oral - Thin Straw -- Oral - Puree -- Oral - Mech Soft -- Oral - Regular -- Oral - Multi-Consistency -- Oral - Pill -- Oral Phase - Comment --  CHL IP PHARYNGEAL PHASE 09/25/2019 Pharyngeal Phase Impaired Pharyngeal- Pudding Teaspoon -- Pharyngeal -- Pharyngeal- Pudding Cup -- Pharyngeal -- Pharyngeal- Honey Teaspoon Delayed swallow initiation-vallecula;Reduced pharyngeal peristalsis;Reduced epiglottic inversion;Reduced tongue base retraction;Pharyngeal residue - valleculae;Pharyngeal residue - pyriform Pharyngeal -- Pharyngeal- Honey Cup -- Pharyngeal -- Pharyngeal- Nectar Teaspoon Delayed swallow initiation-pyriform sinuses;Reduced epiglottic inversion;Reduced  pharyngeal peristalsis;Reduced airway/laryngeal closure;Reduced tongue base retraction;Penetration/Aspiration during  swallow;Penetration/Apiration after swallow;Trace aspiration;Pharyngeal residue - valleculae;Pharyngeal residue - pyriform;Pharyngeal residue - cp segment Pharyngeal Material enters airway, passes BELOW cords then ejected out;Material enters airway, CONTACTS cords and not ejected out Pharyngeal- Nectar Cup -- Pharyngeal -- Pharyngeal- Nectar Straw -- Pharyngeal -- Pharyngeal- Thin Teaspoon Delayed swallow initiation-pyriform sinuses;Reduced pharyngeal peristalsis;Reduced epiglottic inversion;Reduced tongue base retraction;Penetration/Apiration after swallow;Trace aspiration;Pharyngeal residue - valleculae;Pharyngeal residue - pyriform Pharyngeal Material enters airway, passes BELOW cords and not ejected out despite cough attempt by patient;Material enters airway, passes BELOW cords then ejected out Pharyngeal- Thin Cup Reduced epiglottic inversion;Reduced pharyngeal peristalsis;Reduced airway/laryngeal closure;Penetration/Apiration after swallow;Trace aspiration;Pharyngeal residue - valleculae;Pharyngeal residue - pyriform Pharyngeal Material enters airway, passes BELOW cords then ejected out;Material enters airway, CONTACTS cords and not ejected out Pharyngeal- Thin Straw -- Pharyngeal -- Pharyngeal- Puree Delayed swallow initiation-vallecula;Reduced pharyngeal peristalsis;Reduced epiglottic inversion;Reduced tongue base retraction;Pharyngeal residue - valleculae;Pharyngeal residue - pyriform;Pharyngeal residue - posterior pharnyx Pharyngeal -- Pharyngeal- Mechanical Soft -- Pharyngeal -- Pharyngeal- Regular -- Pharyngeal -- Pharyngeal- Multi-consistency -- Pharyngeal -- Pharyngeal- Pill -- Pharyngeal -- Pharyngeal Comment reduced tongue base retraction, epiglottic deflection, and pharyngeal stripping  CHL IP CERVICAL ESOPHAGEAL PHASE 09/25/2019 Cervical Esophageal Phase Impaired Pudding Teaspoon --  Pudding Cup -- Honey Teaspoon -- Honey Cup -- Nectar Teaspoon -- Nectar Cup -- Nectar Straw -- Thin Teaspoon Reduced cricopharyngeal relaxation;Prominent cricopharyngeal segment Thin Cup -- Thin Straw -- Puree -- Mechanical Soft -- Regular -- Multi-consistency -- Pill -- Cervical Esophageal Comment Reduced CP opening Thank you, Genene Churn, [employer] 09/25/2019, 2:38 PM              ECHOCARDIOGRAM COMPLETE  Result Date: 09/22/2019    ECHOCARDIOGRAM REPORT   Patient Name:   SHRIYAN ARAKAWA Date of Exam: 09/22/2019 Medical Rec #:  [id number]      Height:       71.0 in Accession #:    [id number]     Weight:       200.0 lb Date of Birth:  August 19, 1935      BSA:          2.109 m Patient Age:    31 years       BP:           166/76 mmHg Patient Gender: M              HR:           63 bpm. Exam Location:  [facility] Procedure: 2D Echo, Cardiac Doppler and Color Doppler Indications:    CVA  History:        Patient has no prior history of Echocardiogram examinations.                 Stroke and Obesity; Risk Factors:Hypertension, Dyslipidemia and                 Diabetes.  Sonographer:    Dustin Flock RDCS Referring Phys: [phone] CARLOS MADERA  Sonographer Comments: Patient's position. IMPRESSIONS  1. Left ventricular ejection fraction, by estimation, is 60 to 65%. The left ventricle has normal function. The left ventricle has no regional wall motion abnormalities. There is mild left ventricular hypertrophy of the posterior and basal-septal segments. Left ventricular diastolic parameters are consistent with Grade I diastolic dysfunction (impaired relaxation).  2. Right ventricular systolic function is normal. The right ventricular size is normal. There is mildly elevated pulmonary artery systolic pressure.  3. The mitral valve is degenerative. No evidence of mitral valve regurgitation.  4. The aortic valve  is tricuspid. Aortic valve regurgitation is not visualized. No aortic stenosis is present.  FINDINGS  Left Ventricle: Left ventricular ejection fraction, by estimation, is 60 to 65%. The left ventricle has normal function. The left ventricle has no regional wall motion abnormalities. The left ventricular internal cavity size was normal in size. There is  mild left ventricular hypertrophy of the posterior and basal-septal segments. Left ventricular diastolic parameters are consistent with Grade I diastolic dysfunction (impaired relaxation). Indeterminate filling pressures. Right Ventricle: The right ventricular size is normal. No increase in right ventricular wall thickness. Right ventricular systolic function is normal. There is mildly elevated pulmonary artery systolic pressure. The tricuspid regurgitant velocity is 2.75  m/s, and with an assumed right atrial pressure of 3 mmHg, the estimated right ventricular systolic pressure is 33.2 mmHg. Left Atrium: Left atrial size was normal in size. Right Atrium: Right atrial size was normal in size. Pericardium: There is no evidence of pericardial effusion. Mitral Valve: The mitral valve is degenerative in appearance. There is mild thickening of the mitral valve leaflet(s). Mild mitral annular calcification. No evidence of mitral valve regurgitation. Tricuspid Valve: The tricuspid valve is grossly normal. Tricuspid valve regurgitation is mild. Aortic Valve: The aortic valve is tricuspid. . There is mild thickening of the aortic valve. Aortic valve regurgitation is not visualized. No aortic stenosis is present. Mild aortic valve annular calcification. There is mild thickening of the aortic valve. Pulmonic Valve: The pulmonic valve was grossly normal. Pulmonic valve regurgitation is not visualized. Aorta: The aortic root is normal in size and structure. Venous: The inferior vena cava was not well visualized. IAS/Shunts: The interatrial septum was not well visualized.  LEFT VENTRICLE PLAX 2D LVIDd:         4.78 cm  Diastology LVIDs:         2.04 cm  LV e' lateral:    7.94 cm/s LV PW:         1.25 cm  LV E/e' lateral: 10.4 LV IVS:        0.97 cm  LV e' medial:    7.07 cm/s LVOT diam:     1.90 cm  LV E/e' medial:  11.7 LV SV:         90 LV SV Index:   43 LVOT Area:     2.84 cm  RIGHT VENTRICLE RV Basal diam:  3.00 cm RV S prime:     6.31 cm/s TAPSE (M-mode): 2.4 cm LEFT ATRIUM             Index LA diam:        3.10 cm 1.47 cm/m LA Vol (A2C):   61.1 ml 28.98 ml/m LA Vol (A4C):   49.5 ml 23.48 ml/m LA Biplane Vol: 60.7 ml 28.79 ml/m  AORTIC VALVE LVOT Vmax:   135.00 cm/s LVOT Vmean:  86.300 cm/s LVOT VTI:    0.317 m  AORTA Ao Root diam: 3.10 cm MITRAL VALVE               TRICUSPID VALVE MV Area (PHT): 2.59 cm    TR Peak grad:   30.2 mmHg MV Decel Time: 293 msec    TR Vmax:        275.00 cm/s MV E velocity: 82.60 cm/s MV A velocity: 96.80 cm/s  SHUNTS MV E/A ratio:  0.85        Systemic VTI:  0.32 m  Systemic Diam: 1.90 cm Prentice Docker MD Electronically signed by Prentice Docker MD Signature Date/Time: 09/22/2019/2:31:19 PM    Final     Catarina Hartshorn, DO  [facility]  If 7PM-7AM, please contact night-coverage [email or web address] Password [email or web address] 09/27/2019, 1:53 PM   LOS: 4 days

## 2019-09-27 NOTE — Progress Notes (Signed)
Tube feeding started today at 1515 at 25/hr with free water flushes programed in Kangaroo pump at 200 ml Q6hours  Crushed meds given by tube.  Patient has been drowsy since procedure.  Vitals checked and bp141/58 and pulse 107. O2 97% on 2 liters  Daughter has been at bedside today

## 2019-09-27 NOTE — Progress Notes (Signed)
Initial Nutrition Assessment  DOCUMENTATION CODES:   Not applicable  INTERVENTION:  Initiate Osmolite 1.5 @ 25 ml/hr, advance 10 ml every 8 hrs to goal rate 55 ml/hr (1320 ml/day) with 30 ml Prostat via tube once daily  Recommend 200 ml water flushes every 6 hrs  This regimen at goal rate 55 ml/hr (1320 ml/day) will provide 2080 kcal, 98 grams of protein, and 1003 ml free water (1803 ml total with free water flush)  Monitor magnesium, potassium, and phosphorus daily for at least 3 days, MD to replete as needed, as pt is at risk for refeeding syndrome given NPO x 6 days.  NUTRITION DIAGNOSIS:   Inadequate oral intake related to dysphagia as evidenced by NPO status(s/p PEG placement for nutrition, hydration, and medications).    GOAL:   Patient will meet greater than or equal to 90% of their needs  MONITOR:   TF tolerance, Labs, Weight trends  REASON FOR ASSESSMENT:   Consult Enteral/tube feeding initiation and management  ASSESSMENT:   84 year old male with past medical history significant for GERD, HTN, HLD, DM2, history of nonhemorrhagic CVA affecting the left side of brain presented with difficulty swallowing, slurred speech, dizziness, left-sided weakness, left facial droop and reports experiencing some N/V when passing food from from the back of his throat. In ED, CT scan of head negative for acute intracranial normalities, no signs of acute infection on CXR, neurology consulted and patient admitted for TIA/CVA work-up.  Patient admitted on 4/17 and has continued to have difficulty swallowing, facial droop, and slurred speech. Neurology following, concerns for brainstem stroke non visible on MRI. Patient completed MBS on 4/19, recommendations for alternate means of nutrition and medications at this time, anticipation of improvement with continued outpatient swallow rehabilitation. Patient to present to surgery today for PEG placement.  Patient diet advanced to HH/CM on  4/15, noted 0% po intake x 3 meals and has been NPO since 4/16. Patient is at high risk for refeeding, will advance slowly and recommend monitoring magnesium, potassium, and phosphorus daily for at least 3 days, MD to replete as needed.  Abdominal distention noted per RN assessment.  Current wt 199.54 lbs Per care everywhere, pt weighed 205.04 lbs on 08/07/19 and per history, he weighed 210.54 lbs on 03/30/19. This indicates a 5.5 lb (2.7%) wt loss in 6 weeks and 11 lb (5.2%) wt loss over the past 6 months which is insignificant for time frame.  Medications reviewed and include: Vit C, D3, B12, SSI Labs: CBGs 120,109,108,104,97 x 24 hrs  NUTRITION - FOCUSED PHYSICAL EXAM: Deferred   Diet Order:   Diet Order            Diet NPO time specified  Diet effective now              EDUCATION NEEDS:   No education needs have been identified at this time  Skin:  Skin Assessment: Reviewed RN Assessment  Last BM:  4/16  Height:   Ht Readings from Last 1 Encounters:  09/21/19 5\' 11"  (1.803 m)    Weight:   Wt Readings from Last 1 Encounters:  09/21/19 90.7 kg    Ideal Body Weight:     BMI:  Body mass index is 27.89 kg/m.  Estimated Nutritional Needs:   Kcal:  1900-2150  Protein:  90-110  Fluid:  >/= 1.9 L/day   09/23/19, RD, LDN Clinical Nutrition After Hours/Weekend Pager # in Amion

## 2019-09-27 NOTE — Anesthesia Postprocedure Evaluation (Signed)
Anesthesia Post Note  Patient: Donald Conley  Procedure(s) Performed: ESOPHAGOGASTRODUODENOSCOPY (EGD) WITH PROPOFOL (N/A ) PERCUTANEOUS ENDOSCOPIC GASTROSTOMY (PEG) PLACEMENT (N/A )  Patient location during evaluation: PACU Anesthesia Type: General Level of consciousness: awake and alert and patient cooperative Pain management: pain level controlled Vital Signs Assessment: post-procedure vital signs reviewed and stable Respiratory status: spontaneous breathing, nonlabored ventilation, respiratory function stable and patient connected to face mask oxygen Cardiovascular status: blood pressure returned to baseline and stable Postop Assessment: no apparent nausea or vomiting Anesthetic complications: no     Last Vitals:  Vitals:   09/27/19 1124 09/27/19 1235  BP: (!) 172/93 (!) 143/62  Pulse: (!) 107 (!) 107  Resp: 16 (!) 28  Temp: 36.8 C 37.3 C  SpO2: 97% 99%    Last Pain:  Vitals:   09/27/19 1235  TempSrc:   PainSc: Asleep                 Mahasin Riviere

## 2019-09-27 NOTE — Anesthesia Preprocedure Evaluation (Signed)
Anesthesia Evaluation  Patient identified by MRN, date of birth, ID band Patient awake    Reviewed: Allergy & Precautions, H&P , NPO status , Patient's Chart, lab work & pertinent test results, reviewed documented beta blocker date and time   Airway Mallampati: II  TM Distance: >3 FB Neck ROM: full    Dental no notable dental hx. (+) Edentulous Upper, Edentulous Lower   Pulmonary neg pulmonary ROS, former smoker,    Pulmonary exam normal breath sounds clear to auscultation       Cardiovascular Exercise Tolerance: Good negative cardio ROS   Rhythm:regular Rate:Normal     Neuro/Psych negative neurological ROS  negative psych ROS   GI/Hepatic Neg liver ROS, Medicated,  Endo/Other  negative endocrine ROS  Renal/GU negative Renal ROS  negative genitourinary   Musculoskeletal   Abdominal   Peds  Hematology negative hematology ROS (+)   Anesthesia Other Findings   Reproductive/Obstetrics negative OB ROS                             Anesthesia Physical Anesthesia Plan  ASA: III  Anesthesia Plan: General   Post-op Pain Management:    Induction:   PONV Risk Score and Plan: 2 and Ondansetron  Airway Management Planned:   Additional Equipment:   Intra-op Plan:   Post-operative Plan:   Informed Consent: I have reviewed the patients History and Physical, chart, labs and discussed the procedure including the risks, benefits and alternatives for the proposed anesthesia with the patient or authorized representative who has indicated his/her understanding and acceptance.     Dental Advisory Given  Plan Discussed with: CRNA  Anesthesia Plan Comments:         Anesthesia Quick Evaluation

## 2019-09-27 NOTE — Care Management Important Message (Signed)
Important Message  Patient Details  Name: Donald Conley MRN: [id number] Date of Birth: 06/13/1935   Medicare Important Message Given:  Yes     Corey Harold 09/27/2019, 4:42 PM

## 2019-09-28 ENCOUNTER — Inpatient Hospital Stay: Payer: Self-pay

## 2019-09-28 DIAGNOSIS — Z515 Encounter for palliative care: Secondary | ICD-10-CM

## 2019-09-28 DIAGNOSIS — J690 Pneumonitis due to inhalation of food and vomit: Secondary | ICD-10-CM

## 2019-09-28 DIAGNOSIS — J9601 Acute respiratory failure with hypoxia: Secondary | ICD-10-CM

## 2019-09-28 DIAGNOSIS — Z7189 Other specified counseling: Secondary | ICD-10-CM

## 2019-09-28 LAB — COMPREHENSIVE METABOLIC PANEL
ALT: 21 U/L (ref 0–44)
AST: 19 U/L (ref 15–41)
Albumin: 2.9 g/dL — ABNORMAL LOW (ref 3.5–5.0)
Alkaline Phosphatase: 45 U/L (ref 38–126)
Anion gap: 12 (ref 5–15)
BUN: 19 mg/dL (ref 8–23)
CO2: 24 mmol/L (ref 22–32)
Calcium: 8 mg/dL — ABNORMAL LOW (ref 8.9–10.3)
Chloride: 105 mmol/L (ref 98–111)
Creatinine, Ser: 1.05 mg/dL (ref 0.61–1.24)
GFR calc Af Amer: >60 mL/min (ref >60)
GFR calc non Af Amer: >60 mL/min (ref >60)
Glucose, Bld: 157 mg/dL — ABNORMAL HIGH (ref 70–99)
Potassium: 3.5 mmol/L (ref 3.5–5.1)
Sodium: 141 mmol/L (ref 135–145)
Total Bilirubin: 1.8 mg/dL — ABNORMAL HIGH (ref 0.3–1.2)
Total Protein: 5.4 g/dL — ABNORMAL LOW (ref 6.5–8.1)

## 2019-09-28 LAB — PROCALCITONIN: Procalcitonin: <0.1 ng/mL

## 2019-09-28 LAB — LACTIC ACID, PLASMA
Lactic Acid, Venous: 2.1 mmol/L (ref 0.5–1.9)
Lactic Acid, Venous: 2.6 mmol/L (ref 0.5–1.9)

## 2019-09-28 LAB — BLOOD GAS, ARTERIAL
Acid-base deficit: 1.2 mmol/L (ref 0.0–2.0)
Bicarbonate: 24.8 mmol/L (ref 20.0–28.0)
FIO2: 70
O2 Saturation: 99.5 %
Patient temperature: 38
pCO2 arterial: 24.9 mmHg — ABNORMAL LOW (ref 32.0–48.0)
pH, Arterial: 7.539 — ABNORMAL HIGH (ref 7.350–7.450)
pO2, Arterial: 242 mmHg — ABNORMAL HIGH (ref 83.0–108.0)

## 2019-09-28 LAB — CBC
HCT: 43.7 % (ref 39.0–52.0)
Hemoglobin: 14.6 g/dL (ref 13.0–17.0)
MCH: 29.7 pg (ref 26.0–34.0)
MCHC: 33.4 g/dL (ref 30.0–36.0)
MCV: 89 fL (ref 80.0–100.0)
Platelets: 262 10*3/uL (ref 150–400)
RBC: 4.91 MIL/uL (ref 4.22–5.81)
RDW: 12.8 % (ref 11.5–15.5)
WBC: 12.1 10*3/uL — ABNORMAL HIGH (ref 4.0–10.5)
nRBC: 0 % (ref 0.0–0.2)

## 2019-09-28 LAB — GLUCOSE, CAPILLARY
Glucose-Capillary: 146 mg/dL — ABNORMAL HIGH (ref 70–99)
Glucose-Capillary: 126 mg/dL — ABNORMAL HIGH (ref 70–99)

## 2019-09-28 MED ORDER — HALOPERIDOL LACTATE 5 MG/ML IJ SOLN: 0.5000 mg | INTRAMUSCULAR | Status: DC | PRN

## 2019-09-28 MED ORDER — LORAZEPAM 2 MG/ML PO CONC: 1.0000 mg | ORAL | Status: DC | PRN

## 2019-09-28 MED ORDER — GLYCOPYRROLATE 0.2 MG/ML IJ SOLN: 0.2000 mg | INTRAMUSCULAR | Status: DC | PRN

## 2019-09-28 MED ORDER — CHLORHEXIDINE GLUCONATE CLOTH 2 % EX PADS
6.0000 | MEDICATED_PAD | Freq: Every day | CUTANEOUS | Status: DC
  Administered 2019-09-28 – 2019-09-29 (×2): 6 via TOPICAL

## 2019-09-28 MED ORDER — ASPIRIN 81 MG PO CHEW: 81.0000 mg | CHEWABLE_TABLET | Freq: Every day | ORAL | Status: DC

## 2019-09-28 MED ORDER — CLOPIDOGREL BISULFATE 75 MG PO TABS: 75.0000 mg | ORAL_TABLET | Freq: Every day | ORAL | Status: DC

## 2019-09-28 MED ORDER — GLYCOPYRROLATE 0.2 MG/ML IJ SOLN
0.2000 mg | INTRAMUSCULAR | Status: DC | PRN
  Administered 2019-09-28: 0.2 mg via INTRAVENOUS
  Filled 2019-09-28: qty 1

## 2019-09-28 MED ORDER — CHLORHEXIDINE GLUCONATE 0.12% ORAL RINSE (MEDLINE KIT)
15.0000 mL | Freq: Two times a day (BID) | OROMUCOSAL | Status: DC
  Administered 2019-09-28 – 2019-09-29 (×3): 15 mL via OROMUCOSAL

## 2019-09-28 MED ORDER — ATORVASTATIN CALCIUM 20 MG PO TABS: 20.0000 mg | ORAL_TABLET | Freq: Every day | ORAL | Status: DC

## 2019-09-28 MED ORDER — LORAZEPAM 1 MG PO TABS: 1.0000 mg | ORAL_TABLET | ORAL | Status: DC | PRN

## 2019-09-28 MED ORDER — ORAL CARE MOUTH RINSE
15.0000 mL | OROMUCOSAL | Status: DC
  Administered 2019-09-28 – 2019-09-29 (×9): 15 mL via OROMUCOSAL

## 2019-09-28 MED ORDER — HEPARIN SODIUM (PORCINE) 5000 UNIT/ML IJ SOLN: 5000.0000 [IU] | Freq: Three times a day (TID) | INTRAMUSCULAR | Status: DC

## 2019-09-28 MED ORDER — BIOTENE DRY MOUTH MT LIQD: 15.0000 mL | OROMUCOSAL | Status: DC | PRN

## 2019-09-28 MED ORDER — LORAZEPAM 2 MG/ML IJ SOLN: 1.0000 mg | INTRAMUSCULAR | Status: DC | PRN

## 2019-09-28 MED ORDER — GLYCOPYRROLATE 1 MG PO TABS: 1.0000 mg | ORAL_TABLET | ORAL | Status: DC | PRN

## 2019-09-28 MED ORDER — POLYVINYL ALCOHOL 1.4 % OP SOLN: 1.0000 [drp] | Freq: Four times a day (QID) | OPHTHALMIC | Status: DC | PRN

## 2019-09-28 MED ORDER — HALOPERIDOL 0.5 MG PO TABS: 0.5000 mg | ORAL_TABLET | ORAL | Status: DC | PRN

## 2019-09-28 MED ORDER — VITAMIN B-12 1000 MCG PO TABS: 500.0000 ug | ORAL_TABLET | Freq: Every day | ORAL | Status: DC

## 2019-09-28 MED ORDER — HALOPERIDOL LACTATE 2 MG/ML PO CONC
0.5000 mg | ORAL | Status: DC | PRN
  Filled 2019-09-28: qty 0.3

## 2019-09-28 NOTE — Progress Notes (Signed)
Spoke with patient's nurse this am regarding PICC placement. To clarify with the physician this am if still needed.

## 2019-09-28 NOTE — Progress Notes (Signed)
OT Cancellation Note  Patient Details Name: Donald Conley MRN: [id number] DOB: [birth year]   Cancelled Treatment:    Reason Eval/Treat Not Completed: Medical issues which prohibited therapy;Patient not medically ready. Pt has had a change in medical status and is now in ICU with mechanical ventilation. OT will sign off at this time and per [facility] policy will require a new order if/when pt is medically appropriate for rehab services.   Ezra Sites, OTR/L  [phone] 09/28/2019, 7:29 AM

## 2019-09-28 NOTE — Addendum Note (Signed)
Addendum  created 09/28/19 1008 by Windell Norfolk, MD   Clinical Note Signed

## 2019-09-28 NOTE — TOC Progression Note (Signed)
Transition of Care [facility]) - Progression Note    Patient Details  Name: Bryndan Bilyk MRN: [id number] Date of Birth: 12-31-1935  [facility] CM/SW Contact  Jackqulyn Mendel, Chrystine Oiler, RN Phone Number: 09/28/2019, 11:29 AM  Clinical Narrative:   [facility] to update on patient's status.     Expected Discharge Plan: Skilled Nursing Facility Barriers to Discharge: Continued Medical Work up  Expected Discharge Plan and Services Expected Discharge Plan: Skilled Nursing Facility       Living arrangements for the past 2 months: Single Family Home                                       Social Determinants of Health (SDOH) Interventions    Readmission Risk Interventions No flowsheet data found.

## 2019-09-28 NOTE — Addendum Note (Signed)
Addendum  created 09/28/19 1102 by Franco Nones, CRNA   Charge Capture section accepted

## 2019-09-28 NOTE — Procedures (Signed)
**Note De-Identified Tawanna Funk Obfuscation** Extubation Procedure Note  Patient Details:   Name: Fernado Brigante DOB: Sep 21, 1935 MRN: [id number]   Airway Documentation:    Vent end date: 09/28/19 Vent end time: 1216   Evaluation  O2 sats: stable throughout Complications: No apparent complications Patient did tolerate procedure well. Bilateral Breath Sounds: Diminished   Yes  Dasia Guerrier, Megan Salon 09/28/2019, 12:17 PM

## 2019-09-28 NOTE — Progress Notes (Signed)
**Note De-identified  Obfuscation** Sputum collected and sent to lab 

## 2019-09-28 NOTE — Progress Notes (Signed)
35 ML of Versed wasted in steri cycle witnessed by Oren Bracket RN and Roney Mans RN

## 2019-09-28 NOTE — Consult Note (Signed)
Consultation Note Date: 09/28/2019   Patient Name: Donald Conley  DOB: 1936/05/28  MRN: [id number]  Age / Sex: 84 y.o., male  PCP: Kirk Ruths, MD Referring Physician: Orson Eva, MD  Reason for Consultation: Establishing goals of care and Psychosocial/spiritual support  HPI/Patient Profile: 84 y.o. male  with past medical history of HTN/HLD, DM 2, prior history of nonhemorrhagic CVA, arthritis, GERD, total bilateral knee replacement, left hip arthroplasty, right wrist arthroplasty, former smoker 24-pack-year history admitted on 09/21/2019 with nonhemorrhagic brainstem stroke, acute respiratory failure.   Clinical Assessment and Goals of Care: I have reviewed medical records including EPIC notes, labs and imaging, received report from attending and bedside nursing staff, examined the patient and met at bedside with daughter/HC POA, Patty, son Junious Ragone, daughters Kieth Brightly and Taft Mosswood, to discuss diagnosis prognosis, [place], EOL wishes, disposition and options.  I introduced Palliative Medicine as specialized medical care for people living with serious illness. It focuses on providing relief from the symptoms and stress of a serious illness.   We discussed a brief life review of the patient.  [name] worked [employer].  His wife has been deceased for several years now.  As far as functional and nutritional status, he had been independent with ADLs.  His daughter Chong Sicilian lived in his home to help with chores, but he was mostly independent.  We discussed current illness and what it means in the larger context of on-going co-morbidities.  Natural disease trajectory and expectations at EOL were discussed.  The difference between aggressive medical intervention and comfort care was considered in light of the patient's goals of care.  Family states that they are ready for full comfort care, to let nature  take its course.  Family agrees to unburden [name] for medications and treatments that are changing what is happening, instead focusing on comfort.  They share that [name] would not want to live in a diminished state, and he never wanted life support.  Prognosis discussed with permission.  Days to weeks may be possible.  Family states that [name]'s mother lives 3 months in a nursing home after her stroke.  They understand that prognostication may be difficult, staff will keep them updated as things change.  PMT to discuss residential hospice options tomorrow.  Questions and concerns were addressed.  The family was encouraged to call with questions or concerns.   Conference with attending, bedside nursing staff, respiratory therapy, transition of care team related to patient condition, needs, goals of care, now comfort care. Orders adjusted.   HCPOA    HCPOA - daughter, Mutasim Tuckey.  All adult children are present at bedside including Pattie, [employer], Lelia [place], and Leaf.    SUMMARY OF RECOMMENDATIONS   Compassionate extubation, full comfort care Family is agreeable to stop medications and treatments that are not focused on comfort PMT to discuss residential hospice tomorrow.  Code Status/Advance Care Planning:  DNR  Symptom Management:   End-of-life order set implemented  Palliative Prophylaxis:   Frequent Pain  Assessment, Oral Care and Turn Reposition  Additional Recommendations (Limitations, Scope, Preferences):  Full Comfort Care  Psycho-social/Spiritual:   Desire for further Chaplaincy support:no  Additional Recommendations: Caregiving  Support/Resources and Education on Hospice  Prognosis:   < 2 weeks, expected due to likely extension of stroke and family desire to focus on comfort and dignity, let nature take its course.  Discharge Planning: To be determined, PMT to discuss residential hospice placement tomorrow      Primary Diagnoses: Present  on Admission: . Stroke-like symptoms . Brainstem stroke ([place])   I have reviewed the medical record, interviewed the patient and family, and examined the patient. The following aspects are pertinent.  Past Medical History:  Diagnosis Date  . Allergy   . Arthritis   . Cataract   . GERD (gastroesophageal reflux disease)    Social History   Socioeconomic History  . Marital status: Widowed    Spouse name: Not on file  . Number of children: Not on file  . Years of education: Not on file  . Highest education level: Not on file  Occupational History  . Not on file  Tobacco Use  . Smoking status: Former Smoker    Packs/day: 4.00    Years: 6.00    Pack years: 24.00  . Smokeless tobacco: Never Used  Substance and Sexual Activity  . Alcohol use: Never  . Drug use: Never  . Sexual activity: Not on file  Other Topics Concern  . Not on file  Social History Narrative  . Not on file   Social Determinants of Health   Financial Resource Strain:   . Difficulty of Paying Living Expenses:   Food Insecurity:   . Worried About Charity fundraiser in the Last Year:   . Arboriculturist in the Last Year:   Transportation Needs:   . Film/video editor (Medical):   Marland Kitchen Lack of Transportation (Non-Medical):   Physical Activity:   . Days of Exercise per Week:   . Minutes of Exercise per Session:   Stress:   . Feeling of Stress :   Social Connections:   . Frequency of Communication with Friends and Family:   . Frequency of Social Gatherings with Friends and Family:   . Attends Religious Services:   . Active Member of Clubs or Organizations:   . Attends Archivist Meetings:   Marland Kitchen Marital Status:    Family History  Problem Relation Age of Onset  . Cancer Father    Scheduled Meds: . aspirin  81 mg Per Tube Daily  . atorvastatin  20 mg Per Tube Daily  . chlorhexidine gluconate (MEDLINE KIT)  15 mL Mouth Rinse BID  . Chlorhexidine Gluconate Cloth  6 each Topical Q0600  .  clopidogrel  75 mg Per Tube Daily  . free water  200 mL Per Tube Q6H  . heparin injection (subcutaneous)  5,000 Units Subcutaneous Q8H  . insulin aspart  0-6 Units Subcutaneous Q4H  . mouth rinse  15 mL Mouth Rinse 10 times per day  . midazolam  2 mg Intravenous Once  . mupirocin ointment  1 application Nasal BID  . pantoprazole (PROTONIX) IV  40 mg Intravenous Q24H  . vitamin B-12  500 mcg Per Tube Daily   Continuous Infusions: . sodium chloride 100 mL/hr at 09/28/19 0833  . fentaNYL 10 mcg/ml infusion 50 mcg/hr (09/28/19 0125)  . midazolam 0.5 mg/hr (09/28/19 0833)  . norepinephrine (LEVOPHED) Adult infusion Stopped (09/28/19 0129)  .  piperacillin-tazobactam (ZOSYN)  IV Stopped (09/28/19 0828)   PRN Meds:.acetaminophen **OR** acetaminophen (TYLENOL) oral liquid 160 mg/5 mL **OR** acetaminophen, morphine injection, ondansetron, senna-docusate Medications Prior to Admission:  Prior to Admission medications   Medication Sig Start Date End Date Taking? Authorizing Provider  acetaminophen (TYLENOL) 650 MG CR tablet Take 650 mg by mouth as needed.   Yes [provider]  Ascorbic Acid (VITAMIN C) 1000 MG tablet Take 1,000 mg by mouth daily.   Yes [provider]  Calcium Carb-Cholecalciferol (CALCIUM 1000 + D PO) Take 1 tablet by mouth daily.   Yes [provider]  Cholecalciferol (VITAMIN D-1000 MAX ST) 1000 units tablet Take 1,000 Units by mouth daily.   Yes [provider]  clopidogrel (PLAVIX) 75 MG tablet Take 75 mg by mouth daily.   Yes [provider]  doxazosin (CARDURA) 8 MG tablet Take 8 mg by mouth daily. 04/12/17  Yes [provider]  fexofenadine (ALLEGRA) 180 MG tablet Take 180 mg by mouth daily. 07/19/17  Yes [provider]  finasteride (PROSCAR) 5 MG tablet Take 5 mg by mouth daily. 04/12/17  Yes [provider]  fluticasone (FLONASE) 50 MCG/ACT nasal spray Place 1 spray into both nostrils daily.   Yes  [provider]  metFORMIN (GLUCOPHAGE) 500 MG tablet Take 500 mg by mouth 2 (two) times daily with a meal.   Yes [provider]  Omega-3 Fatty Acids (KP FISH OIL) 1200 MG CAPS Take 1,200 mg by mouth daily.   Yes [provider]  omeprazole (PRILOSEC) 20 MG capsule Take 20 mg by mouth daily. 04/12/17  Yes [provider]  psyllium (METAMUCIL) 58.6 % powder Take 1 packet by mouth daily.   Yes [provider]  simvastatin (ZOCOR) 20 MG tablet Take 20 mg by mouth daily. 04/12/17  Yes [provider]   No Known Allergies Review of Systems  Unable to perform ROS: Intubated    Physical Exam Vitals and nursing note reviewed.  Constitutional:      Comments: Intubated/ventilated, sedated  Cardiovascular:     Rate and Rhythm: Bradycardia present.  Pulmonary:     Comments: Intubated/ventilated, sedated Abdominal:     General: Abdomen is flat.  Musculoskeletal:        General: No swelling.  Skin:    General: Skin is warm and dry.  Neurological:     Comments: Intubated/ventilated, sedated     Vital Signs: BP (!) 95/59   Pulse 60   Temp 99.7 F (37.6 C)   Resp 16   Ht 5' 10.98" (1.803 m)   Wt 90.7 kg   SpO2 100%   BMI 27.91 kg/m  Pain Scale: CPOT   Pain Score: Asleep   SpO2: SpO2: 100 % O2 Device:SpO2: 100 % O2 Flow Rate: .O2 Flow Rate (L/min): 2 L/min  IO: Intake/output summary:   Intake/Output Summary (Last 24 hours) at 09/28/2019 0737 Last data filed at 09/28/2019 1062 Gross per 24 hour  Intake 5590.14 ml  Output 310 ml  Net 5280.14 ml    LBM: Last BM Date: 09/22/19 Baseline Weight: Weight: 90.7 kg Most recent weight: Weight: 90.7 kg     Palliative Assessment/Data:   Flowsheet Rows     Most Recent Value  Intake Tab  Referral Department  Hospitalist  Unit at Time of Referral  ICU  Palliative Care Primary Diagnosis  Neurology  Date Notified  09/28/19  Palliative Care Type  New Palliative care  Reason for  referral  Clarify Goals of Care  Date of Admission  09/21/19  Date first seen by Palliative Care  09/28/19  # of days Palliative referral response time  0 Day(s)  # of days IP prior to Palliative referral  7  Clinical Assessment  Palliative Performance Scale Score  10%  Pain Max last 24 hours  Not able to report  Pain Min Last 24 hours  Not able to report  Dyspnea Max Last 24 Hours  Not able to report  Dyspnea Min Last 24 hours  Not able to report  Psychosocial & Spiritual Assessment  Palliative Care Outcomes      Time In: 0910   Time Out: 1030 Time Total: 80 minutes  Greater than 50%  of this time was spent counseling and coordinating care related to the above assessment and plan.  Signed by: Drue Novel, NP   Please contact Palliative Medicine Team phone at [phone] for questions and concerns.  For individual provider: See Shea Evans

## 2019-09-28 NOTE — Progress Notes (Signed)
1 Day Post-Op  Subjective: Intubated and unresponsive  Objective: Vital signs in last 24 hours: Temp:  [98.3 F (36.8 C)-100.6 F (38.1 C)] 99.7 F (37.6 C) (04/22 0900) Pulse Rate:  [53-158] 60 (04/22 0900) Resp:  [12-28] 16 (04/22 0900) BP: (67-175)/(45-94) 95/59 (04/22 0900) SpO2:  [92 %-100 %] 100 % (04/22 0900) FiO2 (%):  [40 %-100 %] 40 % (04/22 0756) Last BM Date: 09/22/19  Intake/Output from previous day: 04/21 0701 - 04/22 0700 In: 450 [I.V.:350; IV Piggyback:100] Out: 10 [Blood:10] Intake/Output this shift: Total I/O In: 5140.1 [I.V.:5090.1; IV Piggyback:50.1] Out: 300 [Emesis/NG output:300]  GI: Soft.  PEG site clean and dry and in appropriate position.  Lab Results:  Recent Labs    09/27/19 2118 09/28/19 0418  WBC 14.2* 12.1*  HGB 15.7 14.6  HCT 48.4 43.7  PLT 347 262   BMET Recent Labs    09/27/19 2118 09/28/19 0726  NA 140 141  K 4.7 3.5  CL 102 105  CO2 28 24  GLUCOSE 167* 157*  BUN 18 19  CREATININE 1.26* 1.05  CALCIUM 8.0* 8.0*   PT/INR No results for input(s): LABPROT, INR in the last 72 hours.  Studies/Results: CT HEAD WO CONTRAST  Result Date: 09/28/2019 CLINICAL DATA:  Stroke, follow-up EXAM: CT HEAD WITHOUT CONTRAST TECHNIQUE: Contiguous axial images were obtained from the base of the skull through the vertex without intravenous contrast. COMPARISON:  09/22/2019 FINDINGS: Brain: There is atrophy and chronic small vessel disease changes. No acute intracranial abnormality. Specifically, no hemorrhage, hydrocephalus, mass lesion, acute infarction, or significant intracranial injury. Vascular: No hyperdense vessel or unexpected calcification. Skull: No acute calvarial abnormality. Sinuses/Orbits: Visualized paranasal sinuses and mastoids clear. Orbital soft tissues unremarkable. Other: Extensive air-fluid levels throughout the paranasal sinuses, new since prior study. IMPRESSION: Atrophy, chronic microvascular disease. No acute intracranial  abnormality. Electronically Signed   By: Rolm Baptise M.D.   On: 09/28/2019 02:16   DG CHEST PORT 1 VIEW  Result Date: 09/27/2019 CLINICAL DATA:  Post intubation EXAM: PORTABLE CHEST 1 VIEW COMPARISON:  09/27/2019 FINDINGS: Interval intubation. Tip of the endotracheal tube is just above the carina. Esophageal tube tip overlies the gastric outlet. Low lung volumes. Mild opacity at the left base. Stable cardiomediastinal silhouette. No pneumothorax. IMPRESSION: 1. Endotracheal tube tip just above the carina 2. Esophageal tube tip overlies the gastric outlet 3. Low lung volumes. Increased opacity at the medial left base, atelectasis versus aspiration. Electronically Signed   By: Donavan Foil M.D.   On: 09/27/2019 21:10   DG CHEST PORT 1 VIEW  Result Date: 09/27/2019 CLINICAL DATA:  84 year old male with hypoxia. EXAM: PORTABLE CHEST 1 VIEW COMPARISON:  Chest radiograph dated 09/21/2019. FINDINGS: Shallow inspiration with bibasilar atelectasis. No focal consolidation, pleural effusion, pneumothorax. Stable cardiomediastinal silhouette. No acute osseous pathology. IMPRESSION: No active disease. Electronically Signed   By: Anner Crete M.D.   On: 09/27/2019 16:27   [place] EKG SITE RITE  Result Date: 09/28/2019 If Site Rite image not attached, placement could not be confirmed due to current cardiac rhythm.   Anti-infectives: Anti-infectives (From admission, onward)   Start     Dose/Rate Route Frequency Ordered Stop   09/28/19 0400  piperacillin-tazobactam (ZOSYN) IVPB 3.375 g     3.375 g 12.5 mL/hr over 240 Minutes Intravenous Every 8 hours 09/27/19 2144     09/27/19 2200  piperacillin-tazobactam (ZOSYN) IVPB 3.375 g     3.375 g 100 mL/hr over 30 Minutes Intravenous  Once 09/27/19 2144  09/27/19 2239   09/27/19 1100  ceFAZolin (ANCEF) IVPB 2g/100 mL premix     2 g 200 mL/hr over 30 Minutes Intravenous On call to O.R. 09/27/19 1059 09/27/19 1208      Assessment/Plan: s/p  Procedure(s): ESOPHAGOGASTRODUODENOSCOPY (EGD) WITH PROPOFOL PERCUTANEOUS ENDOSCOPIC GASTROSTOMY (PEG) PLACEMENT Impression: Respiratory failure, status post PEG placement.  Discussed situation with daughter.  She states that he has been variable over the past few days in terms of his mental status.  I did review the code response with Dr. Hyacinth Meeker of the emergency room.  He did not note tube feed secretions in the back of his throat until after intubation.  I suspect the patient has had a progression of his brainstem stroke.  Family is deciding the next step.  The PEG tube is available for use as needed.  LOS: 5 days    Franky Macho 09/28/2019

## 2019-09-28 NOTE — Progress Notes (Signed)
Lactic acid of 2.6  MD notified via text page.

## 2019-09-28 NOTE — Progress Notes (Signed)
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 Patient woke up earlier not noticing any significant abnormality,he used the bathroom and then went to bed again; after waking up and trying to eat breakfast noticing difficulty swallowing and at the moment was noted by family members difficulty on his speech and left facial droop. Patient was brought to the hospital for further evaluation and management. Of note, patient reported while attempting to eat experiencing some nausea and vomiting after having difficulty passing the food from the back of his throat; it was just the content of the food that he was trying to wait seen in the vomitus. Patient also expressed having some left-sided weakness. No fever, no chest pain, no palpitation, no dysuria, no hematuria, no cough, no shortness of breath, no melena, no hematochezia or any other complaints.  Neurology was consulted to see the patient.  They felt the patient likely had a brainstem stroke despite not being seen on MRI of the brain.  The patient continued to have dysphagia throughout the hospitalization.  He was evaluated by speech therapy.  The patient's daughter wanted the patient to have a gastrostomy tube placed.  This was performed on 09/27/2019.  On the evening after surgery, the patient was noted to be increasingly somnolent and hypoxic.  He was difficult to arouse.  The patient was intubated and moved to the ICU.  He was started on Zosyn for concerns for aspiration  pneumonia.  Palliative medicine was consulted to assist with goals of care discussion.  Pulmonary critical care was consulted.   Assessment/Plan: nonhemorrhagicbrainstem stroke -Continue to have difficulty swallowing, right facial droop poor balance slurred speech -Concerns for brainstem stroke non visible on MRI. -appreciate neurology assistance -Borderline B12 deficiency--repleted -Speech therapy has performed MBS and half outpatient with severe pharyngeal dysphagia; there is anticipation that this will improve with rehabilitation but unclear what is the timeframe for that to happen. Alternate route for nutrition, hydration and medications recommended (PEG tube placement).  -General surgery has been consulted for PEG placement-PEG placed on 09/27/19. -Continue aspirin and plavix -Physical therapy recommending skilled nursing facility for rehabilitation -CTA H&N--no LVO; high grade stenosis of R-vertebral artery -ECHo--no PFO; EF 60-65%, no WMA -TSH within normal limits. -Plan is for dual antiplatelet therapy using full dose aspirin and Plavix; subsequently plan is for him to pursued ASA  Acute Respiratory Failure with Hypoxia -new oxygen requirement after PEG -Intubated evening 09/27/2019 -Secondary to aspiration pneumonia and hypoventilation from altered mental status -CXR--personally reviewed--scattered patchy opacities bilateral -Zosyn started -Tracheal aspirate  Lactic acidosis -Secondary to volume depletion and hypoxia -Check procalcitonin -Fluid resuscitated  Aspiration pneumonia -Check procalcitonin <0.10 -Tracheal aspirate -Start Zosyn -start enteral feeding  Acute metabolic encephalopathy -Multifactorial including effects of anesthesia, aspiration pneumonitis -Concerned about extension of stroke versus seizure -Reconsult neurology -EEG -09/27/2019 CT brain negative -09/27/2019 UA negative for pyuria  Essential hypertension -Patient became hypotensive after  intubation  BPH -continue Proscar and Cardura unable to tolerate p.o.'s.  hyperlipidemia -LDL 110. -change zocor to atorvastatin  type 2 diabetes mellitus (chronically not on  insulin therapy) -holding oral metformin -A1c 6.6 demonstrating good control -Continue sliding scale insulin with close CBG monitoring while NPO.  GERD -Continue PPI;        Disposition Plan: Patient From: Home D/C Place: SNF  When stable Barriers: Not Clinically Stable--intubated on mechanical ventilation 09/27/19  Family Communication:   Daughter updated at bedside 4/22  Consultants:  neurology  Code Status:  FULL  DVT Prophylaxis:  [place] Heparin    Procedures: As Listed in Progress Note Above  Antibiotics: Zosyn 4/21>>>      Subjective: Events of last evening reviewed.  Patient developed altered mental status and lethargy.  Unable to protect his airway.  He was intubated.  Patient is currently sedated on mechanical ventilation.  Review of systems not possible.  Objective: Vitals:   09/28/19 0545 09/28/19 0550 09/28/19 0600 09/28/19 0615  BP: 106/63  104/60 106/60  Pulse: 67  61 (!) 58  Resp: 20  20 20   Temp: (!) 100.4 F (38 C)  (!) 100.4 F (38 C) 100.2 F (37.9 C)  TempSrc:      SpO2: 100% 100% 100% 100%  Weight:      Height:        Intake/Output Summary (Last 24 hours) at 09/28/2019 0704 Last data filed at 09/27/2019 1239 Gross per 24 hour  Intake 450 ml  Output 10 ml  Net 440 ml   Weight change:  Exam:   General:  Pt is intubated and sedated.  HEENT: No icterus, No thrush, No neck mass, [place]/AT  Cardiovascular: RRR, S1/S2, no rubs, no gallops  Respiratory: Right basilar crackles.  No wheezing.  Abdomen: Soft/+BS, non tender, non distended, no guarding  Extremities: No edema, No lymphangitis, No petechiae, No rashes, no synovitis   Data Reviewed: I have personally reviewed following labs and imaging studies Basic Metabolic  Panel: Recent Labs  Lab 09/21/19 1027 09/22/19 0455 09/27/19 0857 09/27/19 2118  NA 138 140 139 140  K 3.7 3.4* 3.7 4.7  CL 99 104 102 102  CO2 26 26 24 28   GLUCOSE 218* 122* 122* 167*  BUN 11 10 15 18   CREATININE 0.79 0.61 0.72 1.26*  CALCIUM 9.2 8.7* 8.3* 8.0*  MG  --   --  2.3  --    Liver Function Tests: Recent Labs  Lab 09/21/19 1027 09/27/19 2118  AST 19 27  ALT 19 25  ALKPHOS 46 51  BILITOT 1.1 1.2  PROT 7.4 5.9*  ALBUMIN 4.1 3.5   Recent Labs  Lab 09/21/19 1027  LIPASE 22   No results for input(s): AMMONIA in the last 168 hours. Coagulation Profile: No results for input(s): INR, PROTIME in the last 168 hours. CBC: Recent Labs  Lab 09/21/19 1027 09/22/19 0455 09/27/19 0857 09/27/19 2118 09/28/19 0418  WBC 9.8 11.0* 8.9 14.2* 12.1*  NEUTROABS 8.0*  --   --  12.5*  --   HGB 15.3 14.1 15.9 15.7 14.6  HCT 45.5 41.5 46.8 48.4 43.7  MCV 88.9 88.9 89.0 92.7 89.0  PLT 257 263 286 347 262   Cardiac Enzymes: Recent Labs  Lab 09/27/19 2118  CKTOTAL 42*   BNP: Invalid input(s): POCBNP CBG: Recent Labs  Lab 09/27/19 1422 09/27/19 1718 09/27/19 2009 09/27/19 2210 09/28/19 0408  GLUCAP 134* 130* 153* 170* 146*   HbA1C: No results for input(s): HGBA1C in the last 72 hours. Urine analysis:    Component Value Date/Time   COLORURINE YELLOW 09/27/2019 2052   APPEARANCEUR CLEAR 09/27/2019 2052  APPEARANCEUR Clear 04/10/2013 1155   LABSPEC 1.023 09/27/2019 2052   LABSPEC 1.025 04/10/2013 1155   PHURINE 5.0 09/27/2019 2052   GLUCOSEU NEGATIVE 09/27/2019 2052   GLUCOSEU Negative 04/10/2013 1155   HGBUR NEGATIVE 09/27/2019 2052   BILIRUBINUR NEGATIVE 09/27/2019 2052   BILIRUBINUR Negative 04/10/2013 1155   KETONESUR 20 (A) 09/27/2019 2052   PROTEINUR 30 (A) 09/27/2019 2052   NITRITE NEGATIVE 09/27/2019 2052   LEUKOCYTESUR NEGATIVE 09/27/2019 2052   LEUKOCYTESUR Negative 04/10/2013 1155   Sepsis  Labs: @LABRCNTIP (procalcitonin:4,lacticidven:4) ) Recent Results (from the past 240 hour(s))  Respiratory Panel by RT PCR (Flu A&B, Covid) - Nasopharyngeal Swab     Status: None   Collection Time: 09/21/19 10:53 AM   Specimen: Nasopharyngeal Swab  Result Value Ref Range Status   SARS Coronavirus 2 by RT PCR NEGATIVE NEGATIVE Final    Comment: (NOTE) SARS-CoV-2 target nucleic acids are NOT DETECTED. The SARS-CoV-2 RNA is generally detectable in upper respiratoy specimens during the acute phase of infection. The lowest concentration of SARS-CoV-2 viral copies this assay can detect is 131 copies/mL. A negative result does not preclude SARS-Cov-2 infection and should not be used as the sole basis for treatment or other patient management decisions. A negative result may occur with  improper specimen collection/handling, submission of specimen other than nasopharyngeal swab, presence of viral mutation(s) within the areas targeted by this assay, and inadequate number of viral copies (<131 copies/mL). A negative result must be combined with clinical observations, patient history, and epidemiological information. The expected result is Negative. Fact Sheet for Patients:  https://www.moore.com/ Fact Sheet for Healthcare Providers:  https://www.young.biz/ This test is not yet ap proved or cleared by the [place] FDA and  has been authorized for detection and/or diagnosis of SARS-CoV-2 by FDA under an Emergency Use Authorization (EUA). This EUA will remain  in effect (meaning this test can be used) for the duration of the COVID-19 declaration under Section 564(b)(1) of the Act, 21 U.S.C. section 360bbb-3(b)(1), unless the authorization is terminated or revoked sooner.    Influenza A by PCR NEGATIVE NEGATIVE Final   Influenza B by PCR NEGATIVE NEGATIVE Final    Comment: (NOTE) The Xpert Xpress SARS-CoV-2/FLU/RSV assay is intended as an aid in  the  diagnosis of influenza from Nasopharyngeal swab specimens and  should not be used as a sole basis for treatment. Nasal washings and  aspirates are unacceptable for Xpert Xpress SARS-CoV-2/FLU/RSV  testing. Fact Sheet for Patients: https://www.moore.com/ Fact Sheet for Healthcare Providers: https://www.young.biz/ This test is not yet approved or cleared by the [place] FDA and  has been authorized for detection and/or diagnosis of SARS-CoV-2 by  FDA under an Emergency Use Authorization (EUA). This EUA will remain  in effect (meaning this test can be used) for the duration of the  Covid-19 declaration under Section 564(b)(1) of the Act, 21  U.S.C. section 360bbb-3(b)(1), unless the authorization is  terminated or revoked. Performed at [facility], [address], [place], [place] [address]   Surgical pcr screen     Status: Abnormal   Collection Time: 09/26/19  9:09 PM   Specimen: Nasal Mucosa; Nasal Swab  Result Value Ref Range Status   MRSA, PCR NEGATIVE NEGATIVE Final   Staphylococcus aureus POSITIVE (A) NEGATIVE Final    Comment: (NOTE) The Xpert SA Assay (FDA approved for NASAL specimens in patients 47 years of age and older), is one component of a comprehensive surveillance program. It is not intended to diagnose infection nor  to guide or monitor treatment. Performed at [facility], [address], [place], [place] [address]      Scheduled Meds: . vitamin C  1,000 mg Per Tube Daily  . Chlorhexidine Gluconate Cloth  6 each Topical Q0600  . fluticasone  1 spray Each Nare Daily  . free water  200 mL Per Tube Q6H  . insulin aspart  0-6 Units Subcutaneous Q4H  . midazolam  2 mg Intravenous Once  . mupirocin ointment  1 application Nasal BID  . pantoprazole (PROTONIX) IV  40 mg Intravenous Q24H   Continuous Infusions: . sodium chloride 150 mL/hr at 09/28/19 0425  . fentaNYL 10 mcg/ml infusion 50 mcg/hr (09/28/19 0125)  . midazolam  0.5 mg/hr (09/27/19 2152)  . norepinephrine (LEVOPHED) Adult infusion Stopped (09/28/19 0130)  . piperacillin-tazobactam (ZOSYN)  IV 3.375 g (09/28/19 0428)    Procedures/Studies: CT ANGIO HEAD W OR WO CONTRAST  Result Date: 09/22/2019 CLINICAL DATA:  Ataxia, stroke suspected. Limb drift. Right-sided facial numbness. Right-sided weakness. Abnormal speech. EXAM: CT ANGIOGRAPHY HEAD AND NECK TECHNIQUE: Multidetector CT imaging of the head and neck was performed using the standard protocol during bolus administration of intravenous contrast. Multiplanar CT image reconstructions and MIPs were obtained to evaluate the vascular anatomy. Carotid stenosis measurements (when applicable) are obtained utilizing NASCET criteria, using the distal internal carotid diameter as the denominator. CONTRAST:  OMNIPAQUE IOHEXOL 350 MG/ML SOLN COMPARISON:  CT head without contrast 09/22/19. FINDINGS: CTA NECK FINDINGS Aortic arch: A 3 vessel arch configuration is present. Atherosclerotic changes are noted within the arch and origins the great vessels without aneurysm focal stenosis. Right carotid system: Atherosclerotic changes are present at the right carotid bifurcation. Minimal luminal diameter is 3.5 mm. Mild tortuosity is present cervical right ICA without other significant stenosis. Left carotid system: The left common carotid artery within normal limits. Atherosclerotic calcifications are present at the left carotid bifurcation proximal left ICA. Minimal luminal diameter is 3 mm. Cervical left ICA is otherwise normal Vertebral arteries: Left vertebral artery is the dominant vessel. Both vertebral arteries originate from the subclavian arteries. High-grade stenosis is present the origin of the right vertebral artery. The right vertebral artery is occluded the V3 segment. Left vertebral artery is intact. Skeleton: Cervical spine is fused C4-7. Grade 1 anterolisthesis is present at C3-4. Alignment is otherwise intact.  Vertebral body heights are normal. No focal lytic or blastic lesions are present Other neck: The soft tissues the neck are unremarkable. Upper chest: Lung apices are clear. Thoracic inlet is within normal limits. Review of the MIP images confirms the above findings CTA HEAD FINDINGS Anterior circulation: Atherosclerotic calcifications are present within the cavernous internal carotid arteries bilaterally. No significant stenosis is present through the ICA termini. The A1 and M1 segments are normal. The anterior communicating artery is patent. MCA bifurcations are intact bilaterally. The ACA and MCA branch vessels are normal. Posterior circulation: Left vertebral artery feeds basilar. The V4 segment is reconstituted into the right AICA. The basilar artery is normal. Both posterior cerebral arteries originate from basilar tip. The PCA branch vessels are within bilaterally. Venous sinuses: The dural sinuses are patent. Straight sinus deep cerebral veins are intact. Cortical veins are unremarkable. No vascular malformations are present. Anatomic variants: None Review of the MIP images confirms the above findings IMPRESSION: 1. 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 2. The left vertebral artery is  dominant. 3. The basilar artery is reconstituted into the right AICA. 4. Atherosclerotic changes at the carotid bifurcations bilaterally without significant stenosis. 5. No significant proximal stenosis, aneurysm, or branch vessel occlusion within the Circle of Willis. Electronically Signed   By: Marin Roberts M.D.   On: 09/22/2019 21:54   CT HEAD WO CONTRAST  Result Date: 09/28/2019 CLINICAL DATA:  Stroke, follow-up EXAM: CT HEAD WITHOUT CONTRAST TECHNIQUE: Contiguous axial images were obtained from the base of the skull through the vertex without intravenous contrast. COMPARISON:  09/22/2019 FINDINGS: Brain: There is  atrophy and chronic small vessel disease changes. No acute intracranial abnormality. Specifically, no hemorrhage, hydrocephalus, mass lesion, acute infarction, or significant intracranial injury. Vascular: No hyperdense vessel or unexpected calcification. Skull: No acute calvarial abnormality. Sinuses/Orbits: Visualized paranasal sinuses and mastoids clear. Orbital soft tissues unremarkable. Other: Extensive air-fluid levels throughout the paranasal sinuses, new since prior study. IMPRESSION: Atrophy, chronic microvascular disease. No acute intracranial abnormality. Electronically Signed   By: Charlett Nose M.D.   On: 09/28/2019 02:16   CT HEAD WO CONTRAST  Result Date: 09/22/2019 CLINICAL DATA:  Slurred speech. Dizziness EXAM: CT HEAD WITHOUT CONTRAST TECHNIQUE: Contiguous axial images were obtained from the base of the skull through the vertex without intravenous contrast. COMPARISON:  September 21, 2019. FINDINGS: Brain: Mild chronic ischemic white matter disease is noted. No mass effect or midline shift is noted. Ventricular size is within normal limits. There is no evidence of mass lesion, hemorrhage or acute infarction. Vascular: No hyperdense vessel or unexpected calcification. Skull: Normal. Negative for fracture or focal lesion. Sinuses/Orbits: Minimal right sphenoid sinusitis is noted Other: None. IMPRESSION: Mild chronic ischemic white matter disease. Minimal right sphenoid sinusitis. No acute intracranial abnormality seen.31 Electronically Signed   By: Lupita Raider M.D.   On: 09/22/2019 11:51   CT Head Wo Contrast  Result Date: 09/21/2019 CLINICAL DATA:  Dysphagia EXAM: CT HEAD WITHOUT CONTRAST CT NECK WITHOUT CONTRAST TECHNIQUE: Contiguous axial images were obtained from the base of the skull through the vertex without contrast. Multidetector CT imaging of the neck was performed using the standard protocol without intravenous contrast. COMPARISON:  MRI brain 03/30/2019 FINDINGS: CT HEAD Brain:  There is no acute intracranial hemorrhage mass effect or edema. 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 Gray-white differentiation is otherwise preserved. Confluent areas of hypoattenuation in the supratentorial white matter nonspecific but may reflect moderate to advanced chronic microvascular ischemic changes. There is ex vacuo dilatation lateral ventricles. No extra-axial fluid collection. Vascular: There is intracranial atherosclerotic calcification at the skull base. Skull: Unremarkable. Orbits: Unremarkable. CT NECK Pharynx and larynx: Unremarkable without mass or swelling. Salivary glands: Unremarkable. Thyroid: Subcentimeter hypodense nodule of the left thyroid lobe. No further follow-up required per current guidelines. Lymph nodes: No enlarged lymph nodes. Vascular: Calcified plaque at the common carotid bifurcations. Mastoids and visualized paranasal sinuses: Patchy retained secretions in right sphenoid and posterior ethmoid sinuses. Mild left maxillary sinus mucosal thickening. Included mastoid air cells are clear. Skeleton: Degenerative changes of the included spine. Upper chest: No apical lung mass. Other: None. IMPRESSION: No acute intracranial abnormality. Stable chronic findings detailed above No neck mass or adenopathy. Electronically Signed   By: Guadlupe Spanish M.D.   On: 09/21/2019 13:00   CT Soft Tissue Neck Wo Contrast  Result Date: 09/21/2019 CLINICAL DATA:  Dysphagia EXAM: CT HEAD WITHOUT CONTRAST CT NECK WITHOUT CONTRAST TECHNIQUE: Contiguous axial images were obtained from the base of the skull through the vertex without contrast.  Multidetector CT imaging of the neck was performed using the standard protocol without intravenous contrast. COMPARISON:  MRI brain 03/30/2019 FINDINGS: CT HEAD Brain: There is no acute intracranial hemorrhage mass effect or edema. 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 Gray-white differentiation is otherwise preserved. Confluent areas of hypoattenuation  in the supratentorial white matter nonspecific but may reflect moderate to advanced chronic microvascular ischemic changes. There is ex vacuo dilatation lateral ventricles. No extra-axial fluid collection. Vascular: There is intracranial atherosclerotic calcification at the skull base. Skull: Unremarkable. Orbits: Unremarkable. CT NECK Pharynx and larynx: Unremarkable without mass or swelling. Salivary glands: Unremarkable. Thyroid: Subcentimeter hypodense nodule of the left thyroid lobe. No further follow-up required per current guidelines. Lymph nodes: No enlarged lymph nodes. Vascular: Calcified plaque at the common carotid bifurcations. Mastoids and visualized paranasal sinuses: Patchy retained secretions in right sphenoid and posterior ethmoid sinuses. Mild left maxillary sinus mucosal thickening. Included mastoid air cells are clear. Skeleton: Degenerative changes of the included spine. Upper chest: No apical lung mass. Other: None. IMPRESSION: No acute intracranial abnormality. Stable chronic findings detailed above No neck mass or adenopathy. Electronically Signed   By: Guadlupe Spanish M.D.   On: 09/21/2019 13:00   CT ANGIO NECK W OR WO CONTRAST  Result Date: 09/22/2019 CLINICAL DATA:  Ataxia, stroke suspected. Limb drift. Right-sided facial numbness. Right-sided weakness. Abnormal speech. EXAM: CT ANGIOGRAPHY HEAD AND NECK TECHNIQUE: Multidetector CT imaging of the head and neck was performed using the standard protocol during bolus administration of intravenous contrast. Multiplanar CT image reconstructions and MIPs were obtained to evaluate the vascular anatomy. Carotid stenosis measurements (when applicable) are obtained utilizing NASCET criteria, using the distal internal carotid diameter as the denominator. CONTRAST:  OMNIPAQUE IOHEXOL 350 MG/ML SOLN COMPARISON:  CT head without contrast 09/22/19. FINDINGS: CTA NECK FINDINGS Aortic arch: A 3 vessel arch configuration is present. Atherosclerotic  changes are noted within the arch and origins the great vessels without aneurysm focal stenosis. Right carotid system: Atherosclerotic changes are present at the right carotid bifurcation. Minimal luminal diameter is 3.5 mm. Mild tortuosity is present cervical right ICA without other significant stenosis. Left carotid system: The left common carotid artery within normal limits. Atherosclerotic calcifications are present at the left carotid bifurcation proximal left ICA. Minimal luminal diameter is 3 mm. Cervical left ICA is otherwise normal Vertebral arteries: Left vertebral artery is the dominant vessel. Both vertebral arteries originate from the subclavian arteries. High-grade stenosis is present the origin of the right vertebral artery. The right vertebral artery is occluded the V3 segment. Left vertebral artery is intact. Skeleton: Cervical spine is fused C4-7. Grade 1 anterolisthesis is present at C3-4. Alignment is otherwise intact. Vertebral body heights are normal. No focal lytic or blastic lesions are present Other neck: The soft tissues the neck are unremarkable. Upper chest: Lung apices are clear. Thoracic inlet is within normal limits. Review of the MIP images confirms the above findings CTA HEAD FINDINGS Anterior circulation: Atherosclerotic calcifications are present within the cavernous internal carotid arteries bilaterally. No significant stenosis is present through the ICA termini. The A1 and M1 segments are normal. The anterior communicating artery is patent. MCA bifurcations are intact bilaterally. The ACA and MCA branch vessels are normal. Posterior circulation: Left vertebral artery feeds basilar. The V4 segment is reconstituted into the right AICA. The basilar artery is normal. Both posterior cerebral arteries originate from basilar tip. The PCA branch vessels are within bilaterally. Venous sinuses: The dural sinuses are patent. Straight sinus  deep cerebral veins are intact. Cortical veins are  unremarkable. No vascular malformations are present. Anatomic variants: None Review of the MIP images confirms the above findings IMPRESSION: 1. 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 2. The left vertebral artery is dominant. 3. The basilar artery is reconstituted into the right AICA. 4. Atherosclerotic changes at the carotid bifurcations bilaterally without significant stenosis. 5. No significant proximal stenosis, aneurysm, or branch vessel occlusion within the Circle of Willis. Electronically Signed   By: San Morelle M.D.   On: 09/22/2019 21:54   MR BRAIN WO CONTRAST  Result Date: 09/21/2019 CLINICAL DATA:  Dizziness, headache EXAM: MRI HEAD WITHOUT CONTRAST TECHNIQUE: Multiplanar, multiecho pulse sequences of the brain and surrounding structures were obtained without intravenous contrast. COMPARISON:  October 2020 FINDINGS: Motion artifact is present. Brain: There is no acute infarction or intracranial hemorrhage. There is no intracranial mass, mass effect, or edema. There is no hydrocephalus or extra-axial fluid collection. Confluent areas of T2 hyperintensity in the supratentorial white matter are nonspecific probably reflect moderate chronic microvascular ischemic changes. 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 Small chronic cerebellar infarcts are again identified. Prominence of the ventricles and sulci reflects stable parenchymal volume loss. Vascular: Loss of the expected flow void of the intracranial proximal to mid right vertebral artery is again noted. This may reflect chronic occlusion or slow flow. Major vessel flow voids at the skull base are otherwise preserved. Skull and upper cervical spine: Normal marrow signal is preserved. Sinuses/Orbits: Mild mucosal thickening.  Orbits are unremarkable. Other: Sella is unremarkable.  Mastoid air cells are clear. IMPRESSION: No evidence of recent  infarction, hemorrhage, or mass. Stable chronic findings detailed above. Electronically Signed   By: Macy Mis M.D.   On: 09/21/2019 16:59   US Carotid Bilateral (at [facility] and [facility] only)  Result Date: 09/21/2019 CLINICAL DATA:  84 year old male with a history of dizziness EXAM: BILATERAL CAROTID DUPLEX ULTRASOUND TECHNIQUE: Pearline Cables scale imaging, color Doppler and duplex ultrasound were performed of bilateral carotid and vertebral arteries in the neck. COMPARISON:  None. FINDINGS: Criteria: Quantification of carotid stenosis is based on velocity parameters that correlate the residual internal carotid diameter with NASCET-based stenosis levels, using the diameter of the distal internal carotid lumen as the denominator for stenosis measurement. The following velocity measurements were obtained: RIGHT ICA:  Systolic 657 cm/sec, Diastolic 23 cm/sec CCA:  846 cm/sec SYSTOLIC ICA/CCA RATIO:  9.62 ECA:  120 cm/sec LEFT ICA:  Systolic 952 cm/sec, Diastolic 26 cm/sec CCA:  91 cm/sec SYSTOLIC ICA/CCA RATIO:  1.3 ECA:  93 cm/sec Right Brachial SBP: Not acquired Left Brachial SBP: Not acquired RIGHT CAROTID ARTERY: No significant calcifications of the right common carotid artery. Intermediate waveform maintained. Heterogeneous and partially calcified plaque at the right carotid bifurcation. No significant lumen shadowing. Low resistance waveform of the right ICA. Tortuosity RIGHT VERTEBRAL ARTERY: Antegrade flow with low resistance waveform. LEFT CAROTID ARTERY: No significant calcifications of the left common carotid artery. Intermediate waveform maintained. Heterogeneous and partially calcified plaque at the left carotid bifurcation without significant lumen shadowing. Low resistance waveform of the left ICA. Tortuosity LEFT VERTEBRAL ARTERY:  Antegrade flow with low resistance waveform. IMPRESSION: Color duplex indicates minimal heterogeneous and calcified plaque, with no hemodynamically significant stenosis by duplex  criteria in the extracranial cerebrovascular circulation. Signed, Dulcy Fanny. Dellia Nims, [facility]el[facility] Rad[facility] Electronically Signed  By: Gilmer Mor D.O.   On: 09/21/2019 16:22   DG CHEST PORT 1 VIEW  Result Date: 09/27/2019 CLINICAL DATA:  Post intubation EXAM: PORTABLE CHEST 1 VIEW COMPARISON:  09/27/2019 FINDINGS: Interval intubation. Tip of the endotracheal tube is just above the carina. Esophageal tube tip overlies the gastric outlet. Low lung volumes. Mild opacity at the left base. Stable cardiomediastinal silhouette. No pneumothorax. IMPRESSION: 1. Endotracheal tube tip just above the carina 2. Esophageal tube tip overlies the gastric outlet 3. Low lung volumes. Increased opacity at the medial left base, atelectasis versus aspiration. Electronically Signed   By: Jasmine Pang M.D.   On: 09/27/2019 21:10   DG CHEST PORT 1 VIEW  Result Date: 09/27/2019 CLINICAL DATA:  84 year old male with hypoxia. EXAM: PORTABLE CHEST 1 VIEW COMPARISON:  Chest radiograph dated 09/21/2019. FINDINGS: Shallow inspiration with bibasilar atelectasis. No focal consolidation, pleural effusion, pneumothorax. Stable cardiomediastinal silhouette. No acute osseous pathology. IMPRESSION: No active disease. Electronically Signed   By: Elgie Collard M.D.   On: 09/27/2019 16:27   DG Chest Portable 1 View  Result Date: 09/21/2019 CLINICAL DATA:  Dysphagia EXAM: PORTABLE CHEST 1 VIEW COMPARISON:  10/10/2012.  CT 11/28/2018 FINDINGS: Heart is normal size. No confluent opacities, effusions or edema. No acute bony abnormality. IMPRESSION: No active disease. Electronically Signed   By: Charlett Nose M.D.   On: 09/21/2019 10:56   DG Swallowing Func-Speech Pathology  Result Date: 09/25/2019 Objective Swallowing Evaluation: Type of Study: MBS-Modified Barium Swallow Study  Patient Details Name: Newton Frutiger MRN: [id number] Date of Birth: Jul 21, 1935 Today's Date: 09/25/2019 Time:  SLP Start Time (ACUTE ONLY): 1224 -SLP Stop Time (ACUTE ONLY): 1257 SLP Time Calculation (min) (ACUTE ONLY): 33 min Past Medical History: Past Medical History: Diagnosis Date . Allergy  . Arthritis  . Cataract  . GERD (gastroesophageal reflux disease)  Past Surgical History: Past Surgical History: Procedure Laterality Date . REPLACEMENT TOTAL KNEE BILATERAL   . TOTAL HIP ARTHROPLASTY Left  . WRIST ARTHROPLASTY Right  HPI: Melvern Ramone is a 83 y.o. 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  Patient woke up earlier not noticing any significant abnormality,but after waking up and trying to eat breakfast noticing difficulty swallowing and at the moment was noted by family members difficulty on his speech and left facial droop.  Patient was brought to the hospital for further evaluation and management. MRI reveals "No evidence of recent infarction, hemorrhage, or mass. Stable chronic findings detailed above."  Subjective: "They gave me a suction in the room." Assessment / Plan / Recommendation CHL IP CLINICAL IMPRESSIONS 09/25/2019 Clinical Impression Pt presents with functionally severe pharyngeal phase dysphagia characterized by reduced tongue base retraction, epiglottic deflection, pharyngeal stripping, and UES relaxation resulting in severe pharyngeal residuals with variable episodes of aspiration (generally sensed with cough/throat clear) during and after the swallow. Pt with improved pharyngeal clearance (however not consistent) with head turn to the RIGHT with chin tuck for all po trials. This appeared to facilitate increased epiglottic deflection, pharyngeal  stripping, and UES opening, however was not consistent. Pt with occasional episodes of trace aspiration from residuals with thin and nectar thick liquids. Pt is at risk for aspiration and impaired ability to meet nutrition/hydration  needs due to acute stroke. 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 Recommend NPO with dysphagia treatment (ice chips PRN after oral care and trials bites of puree with head turn to the RIGHT).  SLP Visit Diagnosis Dysphagia, pharyngeal phase (R13.13);Dysphagia, pharyngoesophageal phase (R13.14) Attention and concentration deficit following -- Frontal lobe and executive function deficit following -- Impact on safety and function Moderate aspiration risk;Severe aspiration risk;Risk for inadequate nutrition/hydration   CHL IP TREATMENT RECOMMENDATION 09/25/2019 Treatment Recommendations Therapy as outlined in treatment plan below   Prognosis 09/25/2019 Prognosis for Safe Diet Advancement Guarded Barriers to Reach Goals Severity of deficits Barriers/Prognosis Comment -- CHL IP DIET RECOMMENDATION 09/25/2019 SLP Diet Recommendations NPO;Alternative means - long-term;Ice chips PRN after oral care Liquid Administration via -- Medication Administration Via alternative means Compensations Small sips/bites;Multiple dry swallows after each bite/sip Postural Changes --   CHL IP OTHER RECOMMENDATIONS 09/25/2019 Recommended Consults Consider GI evaluation Oral Care Recommendations Oral care QID Other Recommendations --   CHL IP FOLLOW UP RECOMMENDATIONS 09/25/2019 Follow up Recommendations Skilled Nursing facility   [facility] IP FREQUENCY AND DURATION 09/25/2019 Speech Therapy Frequency (ACUTE ONLY) min 2x/week Treatment Duration 1 week      CHL IP ORAL PHASE 09/25/2019 Oral Phase WFL Oral - Pudding Teaspoon -- Oral - Pudding Cup -- Oral - Honey  Teaspoon -- Oral - Honey Cup -- Oral - Nectar Teaspoon -- Oral - Nectar Cup -- Oral - Nectar Straw -- Oral - Thin Teaspoon -- Oral - Thin Cup -- Oral - Thin Straw -- Oral - Puree -- Oral - Mech Soft -- Oral - Regular -- Oral - Multi-Consistency -- Oral - Pill -- Oral Phase - Comment --  CHL IP PHARYNGEAL PHASE 09/25/2019 Pharyngeal Phase Impaired Pharyngeal- Pudding Teaspoon -- Pharyngeal -- Pharyngeal- Pudding Cup -- Pharyngeal -- Pharyngeal- Honey Teaspoon Delayed swallow initiation-vallecula;Reduced pharyngeal peristalsis;Reduced epiglottic inversion;Reduced tongue base retraction;Pharyngeal residue - valleculae;Pharyngeal residue - pyriform Pharyngeal -- Pharyngeal- Honey Cup -- Pharyngeal -- Pharyngeal- Nectar Teaspoon Delayed swallow initiation-pyriform sinuses;Reduced epiglottic inversion;Reduced pharyngeal peristalsis;Reduced airway/laryngeal closure;Reduced tongue base retraction;Penetration/Aspiration during swallow;Penetration/Apiration after swallow;Trace aspiration;Pharyngeal residue - valleculae;Pharyngeal residue - pyriform;Pharyngeal residue - cp segment Pharyngeal Material enters airway, passes BELOW cords then ejected out;Material enters airway, CONTACTS cords and not ejected out Pharyngeal- Nectar Cup -- Pharyngeal -- Pharyngeal- Nectar Straw -- Pharyngeal -- Pharyngeal- Thin Teaspoon Delayed swallow initiation-pyriform sinuses;Reduced pharyngeal peristalsis;Reduced epiglottic inversion;Reduced tongue base retraction;Penetration/Apiration after swallow;Trace aspiration;Pharyngeal residue - valleculae;Pharyngeal residue - pyriform Pharyngeal Material enters airway, passes BELOW cords and not ejected out despite cough attempt by patient;Material enters airway, passes BELOW cords then ejected out Pharyngeal- Thin Cup Reduced epiglottic inversion;Reduced pharyngeal peristalsis;Reduced airway/laryngeal closure;Penetration/Apiration after swallow;Trace aspiration;Pharyngeal residue -  valleculae;Pharyngeal residue - pyriform Pharyngeal Material enters airway, passes BELOW cords then ejected out;Material enters airway, CONTACTS cords and not ejected out Pharyngeal- Thin Straw -- Pharyngeal -- Pharyngeal- Puree Delayed swallow initiation-vallecula;Reduced pharyngeal peristalsis;Reduced epiglottic inversion;Reduced tongue base retraction;Pharyngeal residue - valleculae;Pharyngeal residue - pyriform;Pharyngeal residue - posterior pharnyx Pharyngeal -- Pharyngeal- Mechanical Soft -- Pharyngeal -- Pharyngeal- Regular -- Pharyngeal -- Pharyngeal- Multi-consistency -- Pharyngeal -- Pharyngeal- Pill -- Pharyngeal -- Pharyngeal Comment reduced tongue base retraction, epiglottic deflection, and pharyngeal stripping  CHL IP CERVICAL ESOPHAGEAL PHASE 09/25/2019 Cervical Esophageal Phase Impaired Pudding Teaspoon -- Pudding Cup -- Honey Teaspoon -- Honey Cup -- Nectar Teaspoon -- Nectar Cup -- [employer]  Straw -- Thin Teaspoon Reduced cricopharyngeal relaxation;Prominent cricopharyngeal segment Thin Cup -- Thin Straw -- Puree -- Mechanical Soft -- Regular -- Multi-consistency -- Pill -- Cervical Esophageal Comment Reduced CP opening Thank you, Havery MorosDabney Porter, CCC-SLP [phone] PORTER,DABNEY 09/25/2019, 2:38 PM              ECHOCARDIOGRAM COMPLETE  Result Date: 09/22/2019    ECHOCARDIOGRAM REPORT   Patient Name:   Deitra MayoCHARLES Davoli Date of Exam: 09/22/2019 Medical Rec #:  [id number]      Height:       71.0 in Accession #:    [id number]     Weight:       200.0 lb Date of Birth:  1935/11/08      BSA:          2.109 m Patient Age:    84 years       BP:           166/76 mmHg Patient Gender: M              HR:           63 bpm. [facility] Procedure: 2D Echo, Cardiac Doppler and Color Doppler Indications:    CVA  History:        Patient has no prior history of Echocardiogram examinations.                 Stroke and Obesity; Risk Factors:Hypertension, Dyslipidemia and                 Diabetes.  Sonographer:     Lavenia AtlasBrooke Strickland RDCS Referring Phys: [phone] CARLOS MADERA  Sonographer Comments: Patient's position. IMPRESSIONS  1. Left ventricular ejection fraction, by estimation, is 60 to 65%. The left ventricle has normal function. The left ventricle has no regional wall motion abnormalities. There is mild left ventricular hypertrophy of the posterior and basal-septal segments. Left ventricular diastolic parameters are consistent with Grade I diastolic dysfunction (impaired relaxation).  2. Right ventricular systolic function is normal. The right ventricular size is normal. There is mildly elevated pulmonary artery systolic pressure.  3. The mitral valve is degenerative. No evidence of mitral valve regurgitation.  4. The aortic valve is tricuspid. Aortic valve regurgitation is not visualized. No aortic stenosis is present. FINDINGS  Left Ventricle: Left ventricular ejection fraction, by estimation, is 60 to 65%. The left ventricle has normal function. The left ventricle has no regional wall motion abnormalities. The left ventricular internal cavity size was normal in size. There is  mild left ventricular hypertrophy of the posterior and basal-septal segments. Left ventricular diastolic parameters are consistent with Grade I diastolic dysfunction (impaired relaxation). Indeterminate filling pressures. Right Ventricle: The right ventricular size is normal. No increase in right ventricular wall thickness. Right ventricular systolic function is normal. There is mildly elevated pulmonary artery systolic pressure. The tricuspid regurgitant velocity is 2.75  m/s, and with an assumed right atrial pressure of 3 mmHg, the estimated right ventricular systolic pressure is 33.2 mmHg. Left Atrium: Left atrial size was normal in size. Right Atrium: Right atrial size was normal in size. Pericardium: There is no evidence of pericardial effusion. Mitral Valve: The mitral valve is degenerative in appearance. There is mild thickening of the  mitral valve leaflet(s). Mild mitral annular calcification. No evidence of mitral valve regurgitation. Tricuspid Valve: The tricuspid valve is grossly normal. Tricuspid valve regurgitation is mild. Aortic Valve: The aortic valve is tricuspid. . There is mild thickening of the aortic  valve. Aortic valve regurgitation is not visualized. No aortic stenosis is present. Mild aortic valve annular calcification. There is mild thickening of the aortic valve. Pulmonic Valve: The pulmonic valve was grossly normal. Pulmonic valve regurgitation is not visualized. Aorta: The aortic root is normal in size and structure. Venous: The inferior vena cava was not well visualized. IAS/Shunts: The interatrial septum was not well visualized.  LEFT VENTRICLE PLAX 2D LVIDd:         4.78 cm  Diastology LVIDs:         2.04 cm  LV e' lateral:   7.94 cm/s LV PW:         1.25 cm  LV E/e' lateral: 10.4 LV IVS:        0.97 cm  LV e' medial:    7.07 cm/s LVOT diam:     1.90 cm  LV E/e' medial:  11.7 LV SV:         90 LV SV Index:   43 LVOT Area:     2.84 cm  RIGHT VENTRICLE RV Basal diam:  3.00 cm RV S prime:     6.31 cm/s TAPSE (M-mode): 2.4 cm LEFT ATRIUM             Index LA diam:        3.10 cm 1.47 cm/m LA Vol (A2C):   61.1 ml 28.98 ml/m LA Vol (A4C):   49.5 ml 23.48 ml/m LA Biplane Vol: 60.7 ml 28.79 ml/m  AORTIC VALVE LVOT Vmax:   135.00 cm/s LVOT Vmean:  86.300 cm/s LVOT VTI:    0.317 m  AORTA Ao Root diam: 3.10 cm MITRAL VALVE               TRICUSPID VALVE MV Area (PHT): 2.59 cm    TR Peak grad:   30.2 mmHg MV Decel Time: 293 msec    TR Vmax:        275.00 cm/s MV E velocity: 82.60 cm/s MV A velocity: 96.80 cm/s  SHUNTS MV E/A ratio:  0.85        Systemic VTI:  0.32 m                            Systemic Diam: 1.90 cm Prentice Docker MD Electronically signed by Prentice Docker MD Signature Date/Time: 09/22/2019/2:31:19 PM    Final     Catarina Hartshorn, DO  [facility]  If 7PM-7AM, please contact  night-coverage [email or web address] Password [email or web address] 09/28/2019, 7:04 AM   LOS: 5 days

## 2019-09-28 NOTE — Progress Notes (Signed)
PT Cancellation Note  Patient Details Name: Donald Conley MRN: [id number] DOB: Oct 15, 1935   Cancelled Treatment:    Reason Eval/Treat Not Completed: Medical issues which prohibited therapy.  Patient transferred to a higher level of care and will need new PT consult resume therapy when patient is medically stable.  Thank you.   7:43 AM, 09/28/19 Ocie Bob, MPT Physical Therapist with [facility] [phone] office [phone] mobile phone

## 2019-09-28 NOTE — Anesthesia Postprocedure Evaluation (Signed)
Anesthesia Post Note  Patient: Donald Conley  Procedure(s) Performed: ESOPHAGOGASTRODUODENOSCOPY (EGD) WITH PROPOFOL (N/A ) PERCUTANEOUS ENDOSCOPIC GASTROSTOMY (PEG) PLACEMENT (N/A )  Anesthesia Type: General  Followup rounds on [name] who experienced respiratory arrest last evening, requiring intubation and mechanical ventilation.  Spoke with patient's daughter.  He is currently intubated and unresponsive.  [name] received very minimal anesthesia yesterday (propofol 60mg ).  I would not expect that small amount of medicine to be contributing to his clinical picture at this time.  We will be available for followup as needed.   Last Vitals:  Vitals:   09/28/19 0845 09/28/19 0900  BP: (!) 97/57 (!) 95/59  Pulse: (!) 58 60  Resp: 16 16  Temp: 37.6 C 37.6 C  SpO2: 100% 100%    Last Pain:  Vitals:   09/27/19 1500  TempSrc: Axillary  PainSc:                  09/29/19

## 2019-09-29 DIAGNOSIS — Z7189 Other specified counseling: Secondary | ICD-10-CM

## 2019-09-29 LAB — URINE CULTURE
Culture: NO GROWTH
Special Requests: NORMAL

## 2019-09-29 LAB — RESPIRATORY PANEL BY RT PCR (FLU A&B, COVID)
Influenza A by PCR: NEGATIVE
Influenza B by PCR: NEGATIVE
SARS Coronavirus 2 by RT PCR: NEGATIVE

## 2019-09-29 NOTE — Progress Notes (Signed)
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 Patient woke up earlier not noticing any significant abnormality,he used the bathroom and then went to bed again; after waking up and trying to eat breakfast noticing difficulty swallowing and at the moment was noted by family members difficulty on his speech and left facial droop. Patient was brought to the hospital for further evaluation and management. Of note, patient reported while attempting to eat experiencing some nausea and vomiting after having difficulty passing the food from the back of his throat; it was just the content of the food that he was trying to wait seen in the vomitus. Patient also expressed having some left-sided weakness. No fever, no chest pain, no palpitation, no dysuria, no hematuria, no cough, no shortness of breath, no melena, no hematochezia or any other complaints.  Neurology was consulted to see the patient.  They felt the patient likely had a brainstem stroke despite not being seen on MRI of the brain.  The patient continued to have dysphagia throughout the hospitalization.  He was evaluated by speech therapy.  The patient's daughter wanted the patient to have a gastrostomy tube placed.  This was performed on 09/27/2019.  On the evening after surgery, the patient was noted to be increasingly somnolent and hypoxic.  He was difficult to arouse.  The patient was intubated and moved to the ICU.  He was started on Zosyn for concerns for aspiration  pneumonia.  Palliative medicine was consulted to assist with goals of care discussion.  Pulmonary critical care was consulted.  On the morning of 09/28/2019, there was a family meeting during which goals of care discussion was undertaken.  I discussed the patient's overall poor prognosis given his advanced age, multiple comorbidities, and his recent brainstem stroke.  His daughter expressed that the patient would not have wanted to remain on life support.  She stated that he valued his quality of life.  After discussing advance care plans, the patient's family felt it was in the patient's best interest to change his focus of care to focus on full comfort to allow for natural and dignified death.  Subsequently, compassionate extubation was performed.  After extubation, the patient remained clinically stable.  Discussions regarding residential hospice were undertaken with the patient's family with which they agreed.  Social work was consulted to assist with transition.   Assessment/Plan:  nonhemorrhagicbrainstem stroke -Continue to have difficulty swallowing, right facial droop poor balance slurred speech -Concerns for brainstem stroke non visible on MRI. -appreciate neurology assistance -Borderline B12 deficiency--repleted -Speech therapy has performed MBS and half outpatient with severe pharyngeal dysphagia; there is anticipation that this will improve with rehabilitation but unclear what is the timeframe for that to happen. Alternate route for nutrition, hydration and medications recommended (PEG tube placement).  -General surgery has been consulted for PEG placement-PEGplacedon 09/27/19. -Continue aspirinand plavix -Physical therapy recommending skilled nursing facility for rehabilitation -CTA H&N--no LVO; high grade stenosis of R-vertebral artery -ECHo--no PFO; EF 60-65%, no WMA -TSH within normal limits. -Plan is for dual antiplatelet therapy using full dose  aspirin and Plavix;  subsequently plan is for him to pursuedASA -His daughter expressed that the patient would not have wanted to remain on life support.  She stated that he valued his quality of life.  After discussing advance care plans, the patient's family felt it was in the patient's best interest to change his focus of care to focus on full comfort to allow for natural and dignified death.   Acute Respiratory Failure with Hypoxia -new oxygen requirement after PEG -Intubated evening 09/27/2019 -Secondary to aspiration pneumonia and hypoventilation from altered mental status -CXR--personally reviewed--scattered patchy opacities bilateral -Zosyn started initially -Tracheal aspirate--GNR - His daughter expressed that the patient would not have wanted to remain on life support.  She stated that he valued his quality of life.  After discussing advance care plans, the patient's family felt it was in the patient's best interest to change his focus of care to focus on full comfort to allow for natural and dignified death.    Lactic acidosis -Secondary to volume depletion and hypoxia -Check procalcitonin <0.10 -Fluid resuscitated  Aspiration pneumonia -Check procalcitonin <0.10 -Tracheal aspirate -Start Zosyn initially -start enteral feeding -now focusing on comfort care  Acute metabolic encephalopathy -Multifactorial including effects of anesthesia, aspiration pneumonitis -Concerned about extension of stroke versus seizure -Reconsult neurology -EEG--cancelled due to change in focus to comfort care -09/27/2019 CT brain negative -09/27/2019 UA negative for pyuria  Essential hypertension -Patient became hypotensive after intubation -now focusing on comfort care  BPH -continue Proscar and Cardura unable to tolerate p.o.'s.  hyperlipidemia -LDL 110. -change zocor to atorvastatin  type 2 diabetes mellitus(chronically not on insulin therapy) -holding oralmetformin -A1c 6.6 demonstrating good  control -Continue sliding scale insulin with close CBG monitoring while NPO.  GERD -Continue PPI;        Disposition Plan: Patient From: Home D/C Place: Residential Hospice Barriers: [facility]  Family Communication:Daughter updatedat bedside 4/23  Consultants:neurology  Code Status: FULL  DVT Prophylaxis:  Heparin    Procedures: As Listed in Progress Note Above  Antibiotics: Zosyn 4/21>>>4/22       Total time spent 35 minutes.  Greater than 50% spent face to face counseling and coordinating care.     Subjective:  Patient denies fevers, chills, headache, chest pain, dyspnea, nausea, vomiting, diarrhea, abdominal pain, dysuria,   Objective: Vitals:   09/29/19 0733 09/29/19 0800 09/29/19 0900 09/29/19 1000  BP:  (!) 163/65 (!) 158/77 (!) 179/70  Pulse:      Resp:  (!) 30 12 (!) 27  Temp:  98.6 F (37 C) 98.8 F (37.1 C) 99 F (37.2 C)  TempSrc:      SpO2: 93%     Weight:      Height:        Intake/Output Summary (Last 24 hours) at 09/29/2019 1023 Last data filed at 09/29/2019 0344 Gross per 24 hour  Intake 500.97 ml  Output 625 ml  Net -124.03 ml   Weight change:  Exam:   General:  Pt is alert, follows commands appropriately, not in acute distress  HEENT: No icterus, No thrush, No neck mass, [place]/AT  Cardiovascular: RRR, S1/S2, no rubs, no gallops  Respiratory: bibasilar rales  Abdomen: Soft/+BS, non tender, non distended, no guarding  Extremities: No edema, No lymphangitis, No petechiae, No rashes, no synovitis   Data Reviewed: I have personally reviewed following labs and imaging studies Basic Metabolic Panel: Recent Labs  Lab 09/27/19 0857 09/27/19 2118 09/28/19 0726  NA 139 140 141  K 3.7  4.7 3.5  CL 102 102 105  CO2 '24 28 24  '$ GLUCOSE 122* 167* 157*  BUN '15 18 19  '$ CREATININE 0.72 1.26* 1.05  CALCIUM 8.3* 8.0* 8.0*  MG 2.3  --   --    Liver Function Tests: Recent Labs  Lab 09/27/19 2118  09/28/19 0726  AST 27 19  ALT 25 21  ALKPHOS 51 45  BILITOT 1.2 1.8*  PROT 5.9* 5.4*  ALBUMIN 3.5 2.9*   No results for input(s): LIPASE, AMYLASE in the last 168 hours. No results for input(s): AMMONIA in the last 168 hours. Coagulation Profile: No results for input(s): INR, PROTIME in the last 168 hours. CBC: Recent Labs  Lab 09/27/19 0857 09/27/19 2118 09/28/19 0418  WBC 8.9 14.2* 12.1*  NEUTROABS  --  12.5*  --   HGB 15.9 15.7 14.6  HCT 46.8 48.4 43.7  MCV 89.0 92.7 89.0  PLT 286 347 262   Cardiac Enzymes: Recent Labs  Lab 09/27/19 2118  CKTOTAL 42*   BNP: Invalid input(s): POCBNP CBG: Recent Labs  Lab 09/27/19 1718 09/27/19 2009 09/27/19 2210 09/28/19 0408 09/28/19 0840  GLUCAP 130* 153* 170* 146* 126*   HbA1C: No results for input(s): HGBA1C in the last 72 hours. Urine analysis:    Component Value Date/Time   COLORURINE YELLOW 09/27/2019 2052   APPEARANCEUR CLEAR 09/27/2019 2052   APPEARANCEUR Clear 04/10/2013 1155   LABSPEC 1.023 09/27/2019 2052   LABSPEC 1.025 04/10/2013 1155   PHURINE 5.0 09/27/2019 2052   GLUCOSEU NEGATIVE 09/27/2019 2052   GLUCOSEU Negative 04/10/2013 1155   HGBUR NEGATIVE 09/27/2019 2052   BILIRUBINUR NEGATIVE 09/27/2019 2052   BILIRUBINUR Negative 04/10/2013 1155   KETONESUR 20 (A) 09/27/2019 2052   PROTEINUR 30 (A) 09/27/2019 2052   NITRITE NEGATIVE 09/27/2019 2052   LEUKOCYTESUR NEGATIVE 09/27/2019 2052   LEUKOCYTESUR Negative 04/10/2013 1155   Sepsis Labs: '@LABRCNTIP'$ (procalcitonin:4,lacticidven:4) ) Recent Results (from the past 240 hour(s))  Respiratory Panel by RT PCR (Flu A&B, Covid) - Nasopharyngeal Swab     Status: None   Collection Time: 09/21/19 10:53 AM   Specimen: Nasopharyngeal Swab  Result Value Ref Range Status   SARS Coronavirus 2 by RT PCR NEGATIVE NEGATIVE Final    Comment: (NOTE) SARS-CoV-2 target nucleic acids are NOT DETECTED. The SARS-CoV-2 RNA is generally detectable in upper  respiratoy specimens during the acute phase of infection. The lowest concentration of SARS-CoV-2 viral copies this assay can detect is 131 copies/mL. A negative result does not preclude SARS-Cov-2 infection and should not be used as the sole basis for treatment or other patient management decisions. A negative result may occur with  improper specimen collection/handling, submission of specimen other than nasopharyngeal swab, presence of viral mutation(s) within the areas targeted by this assay, and inadequate number of viral copies (<131 copies/mL). A negative result must be combined with clinical observations, patient history, and epidemiological information. The expected result is Negative. Fact Sheet for Patients:  PinkCheek.be Fact Sheet for Healthcare Providers:  GravelBags.it This test is not yet ap proved or cleared by the [place] FDA and  has been authorized for detection and/or diagnosis of SARS-CoV-2 by FDA under an Emergency Use Authorization (EUA). This EUA will remain  in effect (meaning this test can be used) for the duration of the COVID-19 declaration under Section 564(b)(1) of the Act, 21 U.S.C. section 360bbb-3(b)(1), unless the authorization is terminated or revoked sooner.    Influenza A by PCR NEGATIVE NEGATIVE Final   Influenza B  by PCR NEGATIVE NEGATIVE Final    Comment: (NOTE) The Xpert Xpress SARS-CoV-2/FLU/RSV assay is intended as an aid in  the diagnosis of influenza from Nasopharyngeal swab specimens and  should not be used as a sole basis for treatment. Nasal washings and  aspirates are unacceptable for Xpert Xpress SARS-CoV-2/FLU/RSV  testing. Fact Sheet for Patients: PinkCheek.be Fact Sheet for Healthcare Providers: GravelBags.it This test is not yet approved or cleared by the [place] FDA and  has been authorized for  detection and/or diagnosis of SARS-CoV-2 by  FDA under an Emergency Use Authorization (EUA). This EUA will remain  in effect (meaning this test can be used) for the duration of the  Covid-19 declaration under Section 564(b)(1) of the Act, 21  U.S.C. section 360bbb-3(b)(1), unless the authorization is  terminated or revoked. Performed at [facility], [address], [place], [place] [address]   Surgical pcr screen     Status: Abnormal   Collection Time: 09/26/19  9:09 PM   Specimen: Nasal Mucosa; Nasal Swab  Result Value Ref Range Status   MRSA, PCR NEGATIVE NEGATIVE Final   Staphylococcus aureus POSITIVE (A) NEGATIVE Final    Comment: (NOTE) The Xpert SA Assay (FDA approved for NASAL specimens in patients 54 years of age and older), is one component of a comprehensive surveillance program. It is not intended to diagnose infection nor to guide or monitor treatment. Performed at [facility], [address] [place], [place] [address]   Culture, Urine     Status: None   Collection Time: 09/27/19  8:52 PM   Specimen: Urine, Catheterized  Result Value Ref Range Status   Specimen Description   Final    URINE, CATHETERIZED Performed at [facility], [address] [place], [place] [address]    Special Requests   Final    Normal Performed at [facility], [address] [place], [place] [address]    Culture   Final    NO GROWTH Performed at [facility], [place] [address] [place], [place] [address]    Report Status 09/29/2019 FINAL  Final  Culture, respiratory (non-expectorated)     Status: None (Preliminary result)   Collection Time: 09/28/19  7:45 AM   Specimen: Tracheal Aspirate; Respiratory  Result Value Ref Range Status   Specimen Description   Final    TRACHEAL ASPIRATE Performed at [facility], [address], [place], [place] [address]    Special Requests   Final    NONE Performed at [facility], [address] [place], [place] [address]    Gram Stain   Final     ABUNDANT WBC PRESENT, PREDOMINANTLY PMN RARE GRAM NEGATIVE RODS    Culture   Final    FEW ESCHERICHIA COLI SUSCEPTIBILITIES TO FOLLOW Performed at [facility], [place] [address], [place], [place] [address]    Report Status PENDING  Incomplete     Scheduled Meds: . chlorhexidine gluconate (MEDLINE KIT)  15 mL Mouth Rinse BID  . Chlorhexidine Gluconate Cloth  6 each Topical Daily  . mouth rinse  15 mL Mouth Rinse 10 times per day   Continuous Infusions: . sodium chloride 10 mL/hr at 09/28/19 1800  . fentaNYL 10 mcg/ml infusion 40 mcg/hr (09/28/19 1217)    Procedures/Studies: CT ANGIO HEAD W OR WO CONTRAST  Result Date: 09/22/2019 CLINICAL DATA:  Ataxia, stroke suspected. Limb drift. Right-sided facial numbness. Right-sided weakness. Abnormal speech. EXAM: CT ANGIOGRAPHY HEAD AND NECK TECHNIQUE: Multidetector CT imaging of  the head and neck was performed using the standard protocol during bolus administration of intravenous contrast. Multiplanar CT image reconstructions and MIPs were obtained to evaluate the vascular anatomy. Carotid stenosis measurements (when applicable) are obtained utilizing NASCET criteria, using the distal internal carotid diameter as the denominator. CONTRAST:  160m OMNIPAQUE IOHEXOL 350 MG/ML SOLN COMPARISON:  CT head without contrast 09/22/19. FINDINGS: CTA NECK FINDINGS Aortic arch: A 3 vessel arch configuration is present. Atherosclerotic changes are noted within the arch and origins the great vessels without aneurysm focal stenosis. Right carotid system: Atherosclerotic changes are present at the right carotid bifurcation. Minimal luminal diameter is 3.5 mm. Mild tortuosity is present cervical right ICA without other significant stenosis. Left carotid system: The left common carotid artery within normal limits. Atherosclerotic calcifications are present at the left carotid bifurcation proximal left ICA. Minimal luminal diameter is 3 mm. Cervical left ICA is  otherwise normal Vertebral arteries: Left vertebral artery is the dominant vessel. Both vertebral arteries originate from the subclavian arteries. High-grade stenosis is present the origin of the right vertebral artery. The right vertebral artery is occluded the V3 segment. Left vertebral artery is intact. Skeleton: Cervical spine is fused C4-7. Grade 1 anterolisthesis is present at C3-4. Alignment is otherwise intact. Vertebral body heights are normal. No focal lytic or blastic lesions are present Other neck: The soft tissues the neck are unremarkable. Upper chest: Lung apices are clear. Thoracic inlet is within normal limits. Review of the MIP images confirms the above findings CTA HEAD FINDINGS Anterior circulation: Atherosclerotic calcifications are present within the cavernous internal carotid arteries bilaterally. No significant stenosis is present through the ICA termini. The A1 and M1 segments are normal. The anterior communicating artery is patent. MCA bifurcations are intact bilaterally. The ACA and MCA branch vessels are normal. Posterior circulation: Left vertebral artery feeds basilar. The V4 segment is reconstituted into the right AICA. The basilar artery is normal. Both posterior cerebral arteries originate from basilar tip. The PCA branch vessels are within bilaterally. Venous sinuses: The dural sinuses are patent. Straight sinus deep cerebral veins are intact. Cortical veins are unremarkable. No vascular malformations are present. Anatomic variants: None Review of the MIP images confirms the above findings IMPRESSION: 1. 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 2. The left vertebral artery is dominant. 3. The basilar artery is reconstituted into the right AICA. 4. Atherosclerotic changes at the carotid bifurcations bilaterally without significant stenosis. 5. No significant proximal stenosis, aneurysm, or  branch vessel occlusion within the Circle of Willis. Electronically Signed   By: CSan MorelleM.D.   On: 09/22/2019 21:54   CT HEAD WO CONTRAST  Result Date: 09/28/2019 CLINICAL DATA:  Stroke, follow-up EXAM: CT HEAD WITHOUT CONTRAST TECHNIQUE: Contiguous axial images were obtained from the base of the skull through the vertex without intravenous contrast. COMPARISON:  09/22/2019 FINDINGS: Brain: There is atrophy and chronic small vessel disease changes. No acute intracranial abnormality. Specifically, no hemorrhage, hydrocephalus, mass lesion, acute infarction, or significant intracranial injury. Vascular: No hyperdense vessel or unexpected calcification. Skull: No acute calvarial abnormality. Sinuses/Orbits: Visualized paranasal sinuses and mastoids clear. Orbital soft tissues unremarkable. Other: Extensive air-fluid levels throughout the paranasal sinuses, new since prior study. IMPRESSION: Atrophy, chronic microvascular disease. No acute intracranial abnormality. Electronically Signed   By: KRolm BaptiseM.D.   On: 09/28/2019 02:16   CT HEAD WO CONTRAST  Result Date:  09/22/2019 CLINICAL DATA:  Slurred speech. Dizziness EXAM: CT HEAD WITHOUT CONTRAST TECHNIQUE: Contiguous axial images were obtained from the base of the skull through the vertex without intravenous contrast. COMPARISON:  September 21, 2019. FINDINGS: Brain: Mild chronic ischemic white matter disease is noted. No mass effect or midline shift is noted. Ventricular size is within normal limits. There is no evidence of mass lesion, hemorrhage or acute infarction. Vascular: No hyperdense vessel or unexpected calcification. Skull: Normal. Negative for fracture or focal lesion. Sinuses/Orbits: Minimal right sphenoid sinusitis is noted Other: None. IMPRESSION: Mild chronic ischemic white matter disease. Minimal right sphenoid sinusitis. No acute intracranial abnormality seen.31 Electronically Signed   By: Marijo Conception M.D.   On: 09/22/2019  11:51   CT Head Wo Contrast  Result Date: 09/21/2019 CLINICAL DATA:  Dysphagia EXAM: CT HEAD WITHOUT CONTRAST CT NECK WITHOUT CONTRAST TECHNIQUE: Contiguous axial images were obtained from the base of the skull through the vertex without contrast. Multidetector CT imaging of the neck was performed using the standard protocol without intravenous contrast. COMPARISON:  MRI brain 03/30/2019 FINDINGS: CT HEAD Brain: There is no acute intracranial hemorrhage mass effect or edema. 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 Gray-white differentiation is otherwise preserved. Confluent areas of hypoattenuation in the supratentorial white matter nonspecific but may reflect moderate to advanced chronic microvascular ischemic changes. There is ex vacuo dilatation lateral ventricles. No extra-axial fluid collection. Vascular: There is intracranial atherosclerotic calcification at the skull base. Skull: Unremarkable. Orbits: Unremarkable. CT NECK Pharynx and larynx: Unremarkable without mass or swelling. Salivary glands: Unremarkable. Thyroid: Subcentimeter hypodense nodule of the left thyroid lobe. No further follow-up required per current guidelines. Lymph nodes: No enlarged lymph nodes. Vascular: Calcified plaque at the common carotid bifurcations. Mastoids and visualized paranasal sinuses: Patchy retained secretions in right sphenoid and posterior ethmoid sinuses. Mild left maxillary sinus mucosal thickening. Included mastoid air cells are clear. Skeleton: Degenerative changes of the included spine. Upper chest: No apical lung mass. Other: None. IMPRESSION: No acute intracranial abnormality. Stable chronic findings detailed above No neck mass or adenopathy. Electronically Signed   By: Macy Mis M.D.   On: 09/21/2019 13:00   CT Soft Tissue Neck Wo Contrast  Result Date: 09/21/2019 CLINICAL DATA:  Dysphagia EXAM: CT HEAD WITHOUT CONTRAST CT NECK WITHOUT CONTRAST TECHNIQUE: Contiguous axial images were obtained from  the base of the skull through the vertex without contrast. Multidetector CT imaging of the neck was performed using the standard protocol without intravenous contrast. COMPARISON:  MRI brain 03/30/2019 FINDINGS: CT HEAD Brain: There is no acute intracranial hemorrhage mass effect or edema. 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 Gray-white differentiation is otherwise preserved. Confluent areas of hypoattenuation in the supratentorial white matter nonspecific but may reflect moderate to advanced chronic microvascular ischemic changes. There is ex vacuo dilatation lateral ventricles. No extra-axial fluid collection. Vascular: There is intracranial atherosclerotic calcification at the skull base. Skull: Unremarkable. Orbits: Unremarkable. CT NECK Pharynx and larynx: Unremarkable without mass or swelling. Salivary glands: Unremarkable. Thyroid: Subcentimeter hypodense nodule of the left thyroid lobe. No further follow-up required per current guidelines. Lymph nodes: No enlarged lymph nodes. Vascular: Calcified plaque at the common carotid bifurcations. Mastoids and visualized paranasal sinuses: Patchy retained secretions in right sphenoid and posterior ethmoid sinuses. Mild left maxillary sinus mucosal thickening. Included mastoid air cells are clear. Skeleton: Degenerative changes of the included spine. Upper chest: No apical lung mass. Other: None. IMPRESSION: No acute intracranial abnormality. Stable chronic findings detailed above  No neck mass or adenopathy. Electronically Signed   By: Macy Mis M.D.   On: 09/21/2019 13:00   CT ANGIO NECK W OR WO CONTRAST  Result Date: 09/22/2019 CLINICAL DATA:  Ataxia, stroke suspected. Limb drift. Right-sided facial numbness. Right-sided weakness. Abnormal speech. EXAM: CT ANGIOGRAPHY HEAD AND NECK TECHNIQUE: Multidetector CT imaging of the head and neck was performed using the standard protocol during bolus administration of intravenous contrast. Multiplanar CT image  reconstructions and MIPs were obtained to evaluate the vascular anatomy. Carotid stenosis measurements (when applicable) are obtained utilizing NASCET criteria, using the distal internal carotid diameter as the denominator. CONTRAST:  19m OMNIPAQUE IOHEXOL 350 MG/ML SOLN COMPARISON:  CT head without contrast 09/22/19. FINDINGS: CTA NECK FINDINGS Aortic arch: A 3 vessel arch configuration is present. Atherosclerotic changes are noted within the arch and origins the great vessels without aneurysm focal stenosis. Right carotid system: Atherosclerotic changes are present at the right carotid bifurcation. Minimal luminal diameter is 3.5 mm. Mild tortuosity is present cervical right ICA without other significant stenosis. Left carotid system: The left common carotid artery within normal limits. Atherosclerotic calcifications are present at the left carotid bifurcation proximal left ICA. Minimal luminal diameter is 3 mm. Cervical left ICA is otherwise normal Vertebral arteries: Left vertebral artery is the dominant vessel. Both vertebral arteries originate from the subclavian arteries. High-grade stenosis is present the origin of the right vertebral artery. The right vertebral artery is occluded the V3 segment. Left vertebral artery is intact. Skeleton: Cervical spine is fused C4-7. Grade 1 anterolisthesis is present at C3-4. Alignment is otherwise intact. Vertebral body heights are normal. No focal lytic or blastic lesions are present Other neck: The soft tissues the neck are unremarkable. Upper chest: Lung apices are clear. Thoracic inlet is within normal limits. Review of the MIP images confirms the above findings CTA HEAD FINDINGS Anterior circulation: Atherosclerotic calcifications are present within the cavernous internal carotid arteries bilaterally. No significant stenosis is present through the ICA termini. The A1 and M1 segments are normal. The anterior communicating artery is patent. MCA bifurcations are intact  bilaterally. The ACA and MCA branch vessels are normal. Posterior circulation: Left vertebral artery feeds basilar. The V4 segment is reconstituted into the right AICA. The basilar artery is normal. Both posterior cerebral arteries originate from basilar tip. The PCA branch vessels are within bilaterally. Venous sinuses: The dural sinuses are patent. Straight sinus deep cerebral veins are intact. Cortical veins are unremarkable. No vascular malformations are present. Anatomic variants: None Review of the MIP images confirms the above findings IMPRESSION: 1. 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 2. The left vertebral artery is dominant. 3. The basilar artery is reconstituted into the right AICA. 4. Atherosclerotic changes at the carotid bifurcations bilaterally without significant stenosis. 5. No significant proximal stenosis, aneurysm, or branch vessel occlusion within the Circle of Willis. Electronically Signed   By: CSan MorelleM.D.   On: 09/22/2019 21:54   MR BRAIN WO CONTRAST  Result Date: 09/21/2019 CLINICAL DATA:  Dizziness, headache EXAM: MRI HEAD WITHOUT CONTRAST TECHNIQUE: Multiplanar, multiecho pulse sequences of the brain and surrounding structures were obtained without intravenous contrast. COMPARISON:  October 2020 FINDINGS: Motion artifact is present. Brain: There is no acute infarction or intracranial hemorrhage. There is no intracranial mass, mass effect, or edema. There is no hydrocephalus or extra-axial fluid collection. Confluent areas of T2 hyperintensity in  the supratentorial white matter are nonspecific probably reflect moderate chronic microvascular ischemic changes. 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 Small chronic cerebellar infarcts are again identified. Prominence of the ventricles and sulci reflects stable parenchymal volume loss. Vascular: Loss of the expected flow void  of the intracranial proximal to mid right vertebral artery is again noted. This may reflect chronic occlusion or slow flow. Major vessel flow voids at the skull base are otherwise preserved. Skull and upper cervical spine: Normal marrow signal is preserved. Sinuses/Orbits: Mild mucosal thickening.  Orbits are unremarkable. Other: Sella is unremarkable.  Mastoid air cells are clear. IMPRESSION: No evidence of recent infarction, hemorrhage, or mass. Stable chronic findings detailed above. Electronically Signed   By: Macy Mis M.D.   On: 09/21/2019 16:59   US Carotid Bilateral (at [facility] and [facility] only)  Result Date: 09/21/2019 CLINICAL DATA:  84 year old male with a history of dizziness EXAM: BILATERAL CAROTID DUPLEX ULTRASOUND TECHNIQUE: Pearline Cables scale imaging, color Doppler and duplex ultrasound were performed of bilateral carotid and vertebral arteries in the neck. COMPARISON:  None. FINDINGS: Criteria: Quantification of carotid stenosis is based on velocity parameters that correlate the residual internal carotid diameter with NASCET-based stenosis levels, using the diameter of the distal internal carotid lumen as the denominator for stenosis measurement. The following velocity measurements were obtained: RIGHT ICA:  Systolic 419 cm/sec, Diastolic 23 cm/sec CCA:  622 cm/sec SYSTOLIC ICA/CCA RATIO:  2.97 ECA:  120 cm/sec LEFT ICA:  Systolic 989 cm/sec, Diastolic 26 cm/sec CCA:  91 cm/sec SYSTOLIC ICA/CCA RATIO:  1.3 ECA:  93 cm/sec Right Brachial SBP: Not acquired Left Brachial SBP: Not acquired RIGHT CAROTID ARTERY: No significant calcifications of the right common carotid artery. Intermediate waveform maintained. Heterogeneous and partially calcified plaque at the right carotid bifurcation. No significant lumen shadowing. Low resistance waveform of the right ICA. Tortuosity RIGHT VERTEBRAL ARTERY: Antegrade flow with low resistance waveform. LEFT CAROTID ARTERY: No significant calcifications of the left common  carotid artery. Intermediate waveform maintained. Heterogeneous and partially calcified plaque at the left carotid bifurcation without significant lumen shadowing. Low resistance waveform of the left ICA. Tortuosity LEFT VERTEBRAL ARTERY:  Antegrade flow with low resistance waveform. IMPRESSION: Color duplex indicates minimal heterogeneous and calcified plaque, with no hemodynamically significant stenosis by duplex criteria in the extracranial cerebrovascular circulation. Signed, Dulcy Fanny. Dellia Nims, RPVI [facility] Radiology Electronically Signed   By: Corrie Mckusick D.O.   On: 09/21/2019 16:22   DG CHEST PORT 1 VIEW  Result Date: 09/27/2019 CLINICAL DATA:  Post intubation EXAM: PORTABLE CHEST 1 VIEW COMPARISON:  09/27/2019 FINDINGS: Interval intubation. Tip of the endotracheal tube is just above the carina. Esophageal tube tip overlies the gastric outlet. Low lung volumes. Mild opacity at the left base. Stable cardiomediastinal silhouette. No pneumothorax. IMPRESSION: 1. Endotracheal tube tip just above the carina 2. Esophageal tube tip overlies the gastric outlet 3. Low lung volumes. Increased opacity at the medial left base, atelectasis versus aspiration. Electronically Signed   By: Donavan Foil M.D.   On: 09/27/2019 21:10   DG CHEST PORT 1 VIEW  Result Date: 09/27/2019 CLINICAL DATA:  84 year old male with hypoxia. EXAM: PORTABLE CHEST 1 VIEW COMPARISON:  Chest radiograph dated 09/21/2019. FINDINGS: Shallow inspiration with bibasilar atelectasis. No focal consolidation, pleural effusion, pneumothorax. Stable cardiomediastinal silhouette. No acute osseous pathology. IMPRESSION: No active disease. Electronically Signed   By: Anner Crete M.D.   On: 09/27/2019 16:27   DG Chest Portable 1  View  Result Date: 09/21/2019 CLINICAL DATA:  Dysphagia EXAM: PORTABLE CHEST 1 VIEW COMPARISON:  10/10/2012.  CT 11/28/2018 FINDINGS: Heart is normal size. No  confluent opacities, effusions or edema. No acute bony abnormality. IMPRESSION: No active disease. Electronically Signed   By: Rolm Baptise M.D.   On: 09/21/2019 10:56   DG Swallowing Func-Speech Pathology  Result Date: 09/25/2019 Objective Swallowing Evaluation: Type of Study: MBS-Modified Barium Swallow Study  Patient Details Name: Amante Fomby MRN: [id number] Date of Birth: Feb 04, 1936 Today's Date: 09/25/2019 Time: SLP Start Time (ACUTE ONLY): 1224 -SLP Stop Time (ACUTE ONLY): 1257 SLP Time Calculation (min) (ACUTE ONLY): 33 min Past Medical History: Past Medical History: Diagnosis Date . Allergy  . Arthritis  . Cataract  . GERD (gastroesophageal reflux disease)  Past Surgical History: Past Surgical History: Procedure Laterality Date . REPLACEMENT TOTAL KNEE BILATERAL   . TOTAL HIP ARTHROPLASTY Left  . WRIST ARTHROPLASTY Right  HPI: Doug Bucklin is a 84 y.o. 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  Patient woke up earlier not noticing any significant abnormality,but after waking up and trying to eat breakfast noticing difficulty swallowing and at the moment was noted by family members difficulty on his speech and left facial droop.  Patient was brought to the hospital for further evaluation and management. MRI reveals "No evidence of recent infarction, hemorrhage, or mass. Stable chronic findings detailed above."  Subjective: "They gave me a suction in the room." Assessment / Plan / Recommendation CHL IP CLINICAL IMPRESSIONS 09/25/2019 Clinical Impression Pt presents with functionally severe pharyngeal phase dysphagia characterized by reduced tongue base  retraction, epiglottic deflection, pharyngeal stripping, and UES relaxation resulting in severe pharyngeal residuals with variable episodes of aspiration (generally sensed with cough/throat clear) during and after the swallow. Pt with improved pharyngeal clearance (however not consistent) with head turn to the RIGHT with chin tuck for all po trials. This appeared to facilitate increased epiglottic deflection, pharyngeal stripping, and UES opening, however was not consistent. Pt with occasional episodes of trace aspiration from residuals with thin and nectar thick liquids. Pt is at risk for aspiration and impaired ability to meet nutrition/hydration needs due to acute stroke. 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 Recommend NPO with dysphagia treatment (ice chips PRN after oral care and trials bites of puree with head turn to the RIGHT).  SLP Visit Diagnosis Dysphagia, pharyngeal phase (R13.13);Dysphagia, pharyngoesophageal phase (R13.14) Attention and concentration deficit following -- Frontal lobe and executive function deficit following -- Impact on safety and function Moderate aspiration risk;Severe aspiration risk;Risk for inadequate nutrition/hydration   CHL IP TREATMENT RECOMMENDATION 09/25/2019 Treatment Recommendations Therapy as outlined in treatment plan below   Prognosis 09/25/2019 Prognosis for Safe Diet Advancement Guarded Barriers to Reach Goals Severity of deficits Barriers/Prognosis Comment -- CHL IP DIET RECOMMENDATION 09/25/2019 SLP Diet Recommendations NPO;Alternative means - long-term;Ice chips PRN after oral care Liquid Administration via -- Medication Administration Via alternative means Compensations Small sips/bites;Multiple dry swallows after each bite/sip Postural Changes --   CHL IP OTHER RECOMMENDATIONS 09/25/2019  Recommended Consults Consider GI evaluation Oral Care  Recommendations Oral care QID Other Recommendations --   CHL IP FOLLOW UP RECOMMENDATIONS 09/25/2019 Follow up Recommendations Skilled Nursing facility   [facility] IP FREQUENCY AND DURATION 09/25/2019 Speech Therapy Frequency (ACUTE ONLY) min 2x/week Treatment Duration 1 week      CHL IP ORAL PHASE 09/25/2019 Oral Phase WFL Oral - Pudding Teaspoon -- Oral - Pudding Cup -- Oral - Honey Teaspoon -- Oral - Honey Cup -- Oral - Nectar Teaspoon -- Oral - Nectar Cup -- Oral - Nectar Straw -- Oral - Thin Teaspoon -- Oral - Thin Cup -- Oral - Thin Straw -- Oral - Puree -- Oral - Mech Soft -- Oral - Regular -- Oral - Multi-Consistency -- Oral - Pill -- Oral Phase - Comment --  CHL IP PHARYNGEAL PHASE 09/25/2019 Pharyngeal Phase Impaired Pharyngeal- Pudding Teaspoon -- Pharyngeal -- Pharyngeal- Pudding Cup -- Pharyngeal -- Pharyngeal- Honey Teaspoon Delayed swallow initiation-vallecula;Reduced pharyngeal peristalsis;Reduced epiglottic inversion;Reduced tongue base retraction;Pharyngeal residue - valleculae;Pharyngeal residue - pyriform Pharyngeal -- Pharyngeal- Honey Cup -- Pharyngeal -- Pharyngeal- Nectar Teaspoon Delayed swallow initiation-pyriform sinuses;Reduced epiglottic inversion;Reduced pharyngeal peristalsis;Reduced airway/laryngeal closure;Reduced tongue base retraction;Penetration/Aspiration during swallow;Penetration/Apiration after swallow;Trace aspiration;Pharyngeal residue - valleculae;Pharyngeal residue - pyriform;Pharyngeal residue - cp segment Pharyngeal Material enters airway, passes BELOW cords then ejected out;Material enters airway, CONTACTS cords and not ejected out Pharyngeal- Nectar Cup -- Pharyngeal -- Pharyngeal- Nectar Straw -- Pharyngeal -- Pharyngeal- Thin Teaspoon Delayed swallow initiation-pyriform sinuses;Reduced pharyngeal peristalsis;Reduced epiglottic inversion;Reduced tongue base retraction;Penetration/Apiration after swallow;Trace  aspiration;Pharyngeal residue - valleculae;Pharyngeal residue - pyriform Pharyngeal Material enters airway, passes BELOW cords and not ejected out despite cough attempt by patient;Material enters airway, passes BELOW cords then ejected out Pharyngeal- Thin Cup Reduced epiglottic inversion;Reduced pharyngeal peristalsis;Reduced airway/laryngeal closure;Penetration/Apiration after swallow;Trace aspiration;Pharyngeal residue - valleculae;Pharyngeal residue - pyriform Pharyngeal Material enters airway, passes BELOW cords then ejected out;Material enters airway, CONTACTS cords and not ejected out Pharyngeal- Thin Straw -- Pharyngeal -- Pharyngeal- Puree Delayed swallow initiation-vallecula;Reduced pharyngeal peristalsis;Reduced epiglottic inversion;Reduced tongue base retraction;Pharyngeal residue - valleculae;Pharyngeal residue - pyriform;Pharyngeal residue - posterior pharnyx Pharyngeal -- Pharyngeal- Mechanical Soft -- Pharyngeal -- Pharyngeal- Regular -- Pharyngeal -- Pharyngeal- Multi-consistency -- Pharyngeal -- Pharyngeal- Pill -- Pharyngeal -- Pharyngeal Comment reduced tongue base retraction, epiglottic deflection, and pharyngeal stripping  CHL IP CERVICAL ESOPHAGEAL PHASE 09/25/2019 Cervical Esophageal Phase Impaired Pudding Teaspoon -- Pudding Cup -- Honey Teaspoon -- Honey Cup -- Nectar Teaspoon -- Nectar Cup -- Nectar Straw -- Thin Teaspoon Reduced cricopharyngeal relaxation;Prominent cricopharyngeal segment Thin Cup -- Thin Straw -- Puree -- Mechanical Soft -- Regular -- Multi-consistency -- Pill -- Cervical Esophageal Comment Reduced CP opening Thank you, Genene Churn, [place] 09/25/2019, 2:38 PM              ECHOCARDIOGRAM COMPLETE  Result Date: 09/22/2019    ECHOCARDIOGRAM REPORT   Patient Name:   GAYLON BENTZ Date of Exam: 09/22/2019 Medical Rec #:  [id number]      Height:       71.0 in Accession #:    [id number]     Weight:       200.0 lb Date of Birth:  05-23-1936       BSA:          2.109 m Patient Age:    71 years       BP:           166/76 mmHg Patient Gender: M              HR:  63 bpm. Exam Location:  [facility] Procedure: 2D Echo, Cardiac Doppler and Color Doppler Indications:    CVA  History:        Patient has no prior history of Echocardiogram examinations.                 Stroke and Obesity; Risk Factors:Hypertension, Dyslipidemia and                 Diabetes.  Sonographer:    Dustin Flock RDCS Referring Phys: [phone] CARLOS MADERA  Sonographer Comments: Patient's position. IMPRESSIONS  1. Left ventricular ejection fraction, by estimation, is 60 to 65%. The left ventricle has normal function. The left ventricle has no regional wall motion abnormalities. There is mild left ventricular hypertrophy of the posterior and basal-septal segments. Left ventricular diastolic parameters are consistent with Grade I diastolic dysfunction (impaired relaxation).  2. Right ventricular systolic function is normal. The right ventricular size is normal. There is mildly elevated pulmonary artery systolic pressure.  3. The mitral valve is degenerative. No evidence of mitral valve regurgitation.  4. The aortic valve is tricuspid. Aortic valve regurgitation is not visualized. No aortic stenosis is present. FINDINGS  Left Ventricle: Left ventricular ejection fraction, by estimation, is 60 to 65%. The left ventricle has normal function. The left ventricle has no regional wall motion abnormalities. The left ventricular internal cavity size was normal in size. There is  mild left ventricular hypertrophy of the posterior and basal-septal segments. Left ventricular diastolic parameters are consistent with Grade I diastolic dysfunction (impaired relaxation). Indeterminate filling pressures. Right Ventricle: The right ventricular size is normal. No increase in right ventricular wall thickness. Right ventricular systolic function is normal. There is mildly elevated pulmonary artery systolic  pressure. The tricuspid regurgitant velocity is 2.75  m/s, and with an assumed right atrial pressure of 3 mmHg, the estimated right ventricular systolic pressure is 35.3 mmHg. Left Atrium: Left atrial size was normal in size. Right Atrium: Right atrial size was normal in size. Pericardium: There is no evidence of pericardial effusion. Mitral Valve: The mitral valve is degenerative in appearance. There is mild thickening of the mitral valve leaflet(s). Mild mitral annular calcification. No evidence of mitral valve regurgitation. Tricuspid Valve: The tricuspid valve is grossly normal. Tricuspid valve regurgitation is mild. Aortic Valve: The aortic valve is tricuspid. . There is mild thickening of the aortic valve. Aortic valve regurgitation is not visualized. No aortic stenosis is present. Mild aortic valve annular calcification. There is mild thickening of the aortic valve. Pulmonic Valve: The pulmonic valve was grossly normal. Pulmonic valve regurgitation is not visualized. Aorta: The aortic root is normal in size and structure. Venous: The inferior vena cava was not well visualized. IAS/Shunts: The interatrial septum was not well visualized.  LEFT VENTRICLE PLAX 2D LVIDd:         4.78 cm  Diastology LVIDs:         2.04 cm  LV e' lateral:   7.94 cm/s LV PW:         1.25 cm  LV E/e' lateral: 10.4 LV IVS:        0.97 cm  LV e' medial:    7.07 cm/s LVOT diam:     1.90 cm  LV E/e' medial:  11.7 LV SV:         90 LV SV Index:   43 LVOT Area:     2.84 cm  RIGHT VENTRICLE RV Basal diam:  3.00 cm RV S prime:  6.31 cm/s TAPSE (M-mode): 2.4 cm LEFT ATRIUM             Index LA diam:        3.10 cm 1.47 cm/m LA Vol (A2C):   61.1 ml 28.98 ml/m LA Vol (A4C):   49.5 ml 23.48 ml/m LA Biplane Vol: 60.7 ml 28.79 ml/m  AORTIC VALVE LVOT Vmax:   135.00 cm/s LVOT Vmean:  86.300 cm/s LVOT VTI:    0.317 m  AORTA Ao Root diam: 3.10 cm MITRAL VALVE               TRICUSPID VALVE MV Area (PHT): 2.59 cm    TR Peak grad:   30.2 mmHg MV  Decel Time: 293 msec    TR Vmax:        275.00 cm/s MV E velocity: 82.60 cm/s MV A velocity: 96.80 cm/s  SHUNTS MV E/A ratio:  0.85        Systemic VTI:  0.32 m                            Systemic Diam: 1.90 cm Kate Sable MD Electronically signed by Kate Sable MD Signature Date/Time: 09/22/2019/2:31:19 PM    Final    [place] EKG SITE RITE  Result Date: 09/28/2019 If Site Rite image not attached, placement could not be confirmed due to current cardiac rhythm.   Orson Eva, DO  [facility]  If 7PM-7AM, please contact night-coverage [email or web address] Password [facility] 09/29/2019, 10:23 AM   LOS: 6 days

## 2019-09-29 NOTE — Progress Notes (Signed)
Nsg Discharge Note  Admit Date:  09/21/2019 Discharge date: 09/29/2019   Deitra Mayo to be D/C'd Hospice house per MD order.  AVS completed.  Copy for chart, and copy for patient signed, and dated. Patient/caregiver able to verbalize understanding.  Discharge Medication: Allergies as of 09/29/2019   No Known Allergies     Medication List    STOP taking these medications   CALCIUM 1000 + D PO   clopidogrel 75 MG tablet Commonly known as: PLAVIX   doxazosin 8 MG tablet Commonly known as: CARDURA   fexofenadine 180 MG tablet Commonly known as: ALLEGRA   finasteride 5 MG tablet Commonly known as: PROSCAR   fluticasone 50 MCG/ACT nasal spray Commonly known as: FLONASE   KP Fish Oil 1200 MG Caps   metFORMIN 500 MG tablet Commonly known as: GLUCOPHAGE   omeprazole 20 MG capsule Commonly known as: PRILOSEC   psyllium 58.6 % powder Commonly known as: METAMUCIL   simvastatin 20 MG tablet Commonly known as: ZOCOR   vitamin C 1000 MG tablet   Vitamin D-1000 Max St 25 MCG (1000 UT) tablet Generic drug: Cholecalciferol     TAKE these medications   acetaminophen 650 MG CR tablet Commonly known as: TYLENOL Take 650 mg by mouth as needed.       Discharge Assessment: Vitals:   09/29/19 1000 09/29/19 1102  BP: (!) 179/70 119/72  Pulse:    Resp: (!) 27 (!) 9  Temp: 99 F (37.2 C) 99.1 F (37.3 C)  SpO2:     Skin clean, dry and intact without evidence of skin break down, no evidence of skin tears noted. IV catheter discontinued intact. Site without signs and symptoms of complications - no redness or edema noted at insertion site, patient denies c/o pain - only slight tenderness at site.  Dressing with slight pressure applied.  D/c Instructions-Education: Discharge instructions given to patient/family with verbalized understanding. D/c education completed with patient/family including follow up instructions, medication list, d/c activities limitations if  indicated, with other d/c instructions as indicated by MD - patient able to verbalize understanding, all questions fully answered. Patient instructed to return to ED, call 911, or call MD for any changes in condition.  Patient escorted via EMS and report given to Hospice RN.  Diego Cory, RN 09/29/2019 2:05 PM

## 2019-09-29 NOTE — TOC Progression Note (Addendum)
Transition of Care [facility]) - Progression Note    Patient Details  Name: Donald Conley MRN: [id number] Date of Birth: February 01, 1936  Transition [facility] Care [facility]) CM/SW Contact  Keno Caraway, Chrystine Oiler, RN Phone Number: 09/29/2019, 10:49 AM  Clinical Narrative: Daughter interested in residential hospice. Referred to [facility]. Cassandra of hospice to call [facility] and schedule a touring visit.    Bed available at [facility] house. Rapid covid test ordered.   Updated Kasie at [facility] of status.     Expected Discharge Plan: Skilled Nursing Facility Barriers to Discharge: Continued Medical Work up  Expected Discharge Plan and Services Expected Discharge Plan: Skilled Nursing Facility       Living arrangements for the past 2 months: Single Family Home                                       Social Determinants of Health (SDOH) Interventions    Readmission Risk Interventions No flowsheet data found.

## 2019-09-29 NOTE — TOC Transition Note (Signed)
Transition of Care [facility]) - CM/SW Discharge Note   Patient Details  Name: Brees Hounshell MRN: [id number] Date of Birth: [birth year]  [facility] of [facility]) CM/SW Contact:  Cleotis Sparr, Chrystine Oiler, RN Phone Number: 09/29/2019, 12:32 PM   Clinical Narrative:   Family has visited [facility] and is signing paperwork to have patient admitted. Patient's rapid covid has resulted. [facility] is ready for patient. Bedside RN to call report and EMS when ready.     Final next level of care: Hospice Medical Facility Barriers to Discharge: Barriers Resolved   Patient Goals and CMS Choice Patient states their goals for this hospitalization and ongoing recovery are:: to go to SNF and return home. CMS Medicare.gov Compare Post Acute Care list provided to:: Patient Represenative (must comment) Choice offered to / list presented to : Adult Children                         HH Agency: [facility] Date [facility] Agency Contacted: 09/29/19 Time HH Agency Contacted: [phone] Representative spoke with at [facility] Agency: Cassandra  Social Determinants of Health (SDOH) Interventions     Readmission Risk Interventions No flowsheet data found.

## 2019-09-29 NOTE — Progress Notes (Signed)
Pt and family advised by MD that comfort feeds may be given. Pt has tried Coke and ice cream so far with minimal distress, just the usual coughing with thick sputum. Vitals remain stable. Will continue to monitor and offer as requested.

## 2019-09-29 NOTE — Discharge Summary (Signed)
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 Patient woke up earlier not noticing any significant abnormality,he used the bathroom and then went to bed again; after waking up and trying to eat breakfast noticing difficulty swallowing and at the moment was noted by family members difficulty on his speech and left facial droop. Patient was brought to the hospital for further evaluation and management. Of note, patient reported while attempting to eat experiencing some nausea and vomiting after having difficulty passing the food from the back of his throat; it was just the content of the food that he was trying to wait seen in the vomitus. Patient also expressed having some left-sided weakness. No fever, no chest pain, no palpitation, no dysuria, no hematuria, no cough, no shortness of breath, no melena, no hematochezia or any other complaints.  Neurology was consulted to see the patient. They felt the patient likely had a brainstem stroke despite not being seen on MRI of the brain. The patient continued to have dysphagia throughout the hospitalization. He was evaluated by speech therapy. The patient's daughter wanted the patient to have a gastrostomy tube placed. This was performed on 09/27/2019.  On the evening after surgery, the patient was noted to be increasingly somnolent and hypoxic. He was difficult to arouse. The patient was intubated and moved to the ICU. He was started on Zosyn for concerns for aspiration pneumonia. Palliative medicine was consulted to assist with goals of care discussion. Pulmonary critical care was consulted.  On the morning of 09/28/2019, there was a family meeting during which goals of care discussion was undertaken.  I discussed the patient's overall poor prognosis given his advanced age, multiple comorbidities, and his recent brainstem stroke.  His daughter expressed that the patient would not have wanted to remain on life support.  She stated that he valued his quality of life.  After discussing advance care plans, the patient's family felt it was in the patient's best interest to change his focus of care to focus on full comfort to allow for natural and dignified death.  Subsequently, compassionate extubation was performed.  After extubation, the patient remained clinically stable.  Discussions regarding residential hospice were undertaken with the patient's family with which they agreed.  Social work was consulted to assist with transition.  Discharge Diagnoses:  nonhemorrhagicbrainstem stroke -Continue to have difficulty swallowing, right facial droop poor balance slurred speech -Concerns for brainstem stroke non visible on MRI. -appreciate neurology assistance -Borderline B12 deficiency--repleted -Speech therapy has performed MBS and half outpatient with severe pharyngeal dysphagia; there is anticipation that this will improve with rehabilitation but unclear what is the timeframe for that to happen. Alternate route for nutrition, hydration and medications recommended (PEG tube placement).  -General surgery has been consulted for PEG placement-PEGplacedon 09/27/19. -Continue aspirinand plavix -Physical therapy recommending skilled nursing facility  for rehabilitation -CTA H&N--no LVO; high grade stenosis of R-vertebral artery -ECHo--no PFO; EF 60-65%, no WMA -TSH  within normal limits. -Plan is for dual antiplatelet therapy using full dose aspirin and Plavix; subsequently plan is for him to pursuedASA -His daughter expressed that the patient would not have wanted to remain on life support.  She stated that he valued his quality of life.  After discussing advance care plans, the patient's family felt it was in the patient's best interest to change his focus of care to focus on full comfort to allow for natural and dignified death.   Acute Respiratory Failure with Hypoxia -new oxygen requirement after PEG -Intubated evening 09/27/2019 -Secondary to aspiration pneumonia and hypoventilation from altered mental status -CXR--personally reviewed--scattered patchy opacities bilateral -Zosyn started initially -Tracheal aspirate--GNR - His daughter expressed that the patient would not have wanted to remain on life support.  She stated that he valued his quality of life.  After discussing advance care plans, the patient's family felt it was in the patient's best interest to change his focus of care to focus on full comfort to allow for natural and dignified death.    Lactic acidosis -Secondary to volume depletion and hypoxia -Check procalcitonin <0.10 -Fluid resuscitated  Aspiration pneumonia -Check procalcitonin<0.10 -Tracheal aspirate -Start Zosyn initially -start enteral feeding -now focusing on comfort care  Acute metabolic encephalopathy -Multifactorial including effects of anesthesia, aspiration pneumonitis -Concerned about extension of stroke versus seizure -Reconsult neurology -EEG--cancelled due to change in focus to comfort care -09/27/2019 CT brain negative -09/27/2019 UA negative for pyuria  Essential hypertension -Patient became hypotensive after intubation -now focusing on comfort care  BPH -continue Proscar and  Cardura unable to tolerate p.o.'s.  hyperlipidemia -LDL 110. -change zocor to atorvastatin  type 2 diabetes mellitus(chronically not on insulin therapy) -holding oralmetformin -A1c 6.6 demonstrating good control -Continue sliding scale insulin with close CBG monitoring while NPO.  GERD -Continue PPI;    Discharge Instructions   Allergies as of 09/29/2019   No Known Allergies     Medication List    STOP taking these medications   CALCIUM 1000 + D PO   clopidogrel 75 MG tablet Commonly known as: PLAVIX   doxazosin 8 MG tablet Commonly known as: CARDURA   fexofenadine 180 MG tablet Commonly known as: ALLEGRA   finasteride 5 MG tablet Commonly known as: PROSCAR   fluticasone 50 MCG/ACT nasal spray Commonly known as: FLONASE   KP Fish Oil 1200 MG Caps   metFORMIN 500 MG tablet Commonly known as: GLUCOPHAGE   omeprazole 20 MG capsule Commonly known as: PRILOSEC   psyllium 58.6 % powder Commonly known as: METAMUCIL   simvastatin 20 MG tablet Commonly known as: ZOCOR   vitamin C 1000 MG tablet   Vitamin D-1000 Max St 25 MCG (1000 UT) tablet Generic drug: Cholecalciferol     TAKE these medications   acetaminophen 650 MG CR tablet Commonly known as: TYLENOL Take 650 mg by mouth as needed.      Contact information for after-discharge care    Destination    [facility] .   Service: Skilled Nursing Contact information: [address] [place] [address] [phone]             No Known Allergies  Consultations:  Neurology  Palliative medicine   Procedures/Studies: CT ANGIO HEAD W OR WO CONTRAST  Result Date: 09/22/2019 CLINICAL DATA:  Ataxia, stroke suspected. Limb drift. Right-sided facial numbness. Right-sided weakness. Abnormal speech. EXAM: CT ANGIOGRAPHY HEAD AND NECK TECHNIQUE: Multidetector CT imaging of the head and neck was performed using the  standard protocol during bolus  administration of intravenous contrast. Multiplanar CT image reconstructions and MIPs were obtained to evaluate the vascular anatomy. Carotid stenosis measurements (when applicable) are obtained utilizing NASCET criteria, using the distal internal carotid diameter as the denominator. CONTRAST:  OMNIPAQUE IOHEXOL 350 MG/ML SOLN COMPARISON:  CT head without contrast 09/22/19. FINDINGS: CTA NECK FINDINGS Aortic arch: A 3 vessel arch configuration is present. Atherosclerotic changes are noted within the arch and origins the great vessels without aneurysm focal stenosis. Right carotid system: Atherosclerotic changes are present at the right carotid bifurcation. Minimal luminal diameter is 3.5 mm. Mild tortuosity is present cervical right ICA without other significant stenosis. Left carotid system: The left common carotid artery within normal limits. Atherosclerotic calcifications are present at the left carotid bifurcation proximal left ICA. Minimal luminal diameter is 3 mm. Cervical left ICA is otherwise normal Vertebral arteries: Left vertebral artery is the dominant vessel. Both vertebral arteries originate from the subclavian arteries. High-grade stenosis is present the origin of the right vertebral artery. The right vertebral artery is occluded the V3 segment. Left vertebral artery is intact. Skeleton: Cervical spine is fused C4-7. Grade 1 anterolisthesis is present at C3-4. Alignment is otherwise intact. Vertebral body heights are normal. No focal lytic or blastic lesions are present Other neck: The soft tissues the neck are unremarkable. Upper chest: Lung apices are clear. Thoracic inlet is within normal limits. Review of the MIP images confirms the above findings CTA HEAD FINDINGS Anterior circulation: Atherosclerotic calcifications are present within the cavernous internal carotid arteries bilaterally. No significant stenosis is present through the ICA termini. The A1 and M1 segments are normal. The  anterior communicating artery is patent. MCA bifurcations are intact bilaterally. The ACA and MCA branch vessels are normal. Posterior circulation: Left vertebral artery feeds basilar. The V4 segment is reconstituted into the right AICA. The basilar artery is normal. Both posterior cerebral arteries originate from basilar tip. The PCA branch vessels are within bilaterally. Venous sinuses: The dural sinuses are patent. Straight sinus deep cerebral veins are intact. Cortical veins are unremarkable. No vascular malformations are present. Anatomic variants: None Review of the MIP images confirms the above findings IMPRESSION: 1. 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 2. The left vertebral artery is dominant. 3. The basilar artery is reconstituted into the right AICA. 4. Atherosclerotic changes at the carotid bifurcations bilaterally without significant stenosis. 5. No significant proximal stenosis, aneurysm, or branch vessel occlusion within the Circle of Willis. Electronically Signed   By: Marin Roberts M.D.   On: 09/22/2019 21:54   CT HEAD WO CONTRAST  Result Date: 09/28/2019 CLINICAL DATA:  Stroke, follow-up EXAM: CT HEAD WITHOUT CONTRAST TECHNIQUE: Contiguous axial images were obtained from the base of the skull through the vertex without intravenous contrast. COMPARISON:  09/22/2019 FINDINGS: Brain: There is atrophy and chronic small vessel disease changes. No acute intracranial abnormality. Specifically, no hemorrhage, hydrocephalus, mass lesion, acute infarction, or significant intracranial injury. Vascular: No hyperdense vessel or unexpected calcification. Skull: No acute calvarial abnormality. Sinuses/Orbits: Visualized paranasal sinuses and mastoids clear. Orbital soft tissues unremarkable. Other: Extensive air-fluid levels throughout the paranasal sinuses, new since prior study. IMPRESSION: Atrophy,  chronic microvascular disease. No acute intracranial abnormality. Electronically Signed   By: Charlett Nose M.D.   On: 09/28/2019 02:16   CT HEAD WO CONTRAST  Result Date: 09/22/2019 CLINICAL DATA:  Slurred speech. Dizziness  EXAM: CT HEAD WITHOUT CONTRAST TECHNIQUE: Contiguous axial images were obtained from the base of the skull through the vertex without intravenous contrast. COMPARISON:  September 21, 2019. FINDINGS: Brain: Mild chronic ischemic white matter disease is noted. No mass effect or midline shift is noted. Ventricular size is within normal limits. There is no evidence of mass lesion, hemorrhage or acute infarction. Vascular: No hyperdense vessel or unexpected calcification. Skull: Normal. Negative for fracture or focal lesion. Sinuses/Orbits: Minimal right sphenoid sinusitis is noted Other: None. IMPRESSION: Mild chronic ischemic white matter disease. Minimal right sphenoid sinusitis. No acute intracranial abnormality seen.31 Electronically Signed   By: Lupita RaiderJames  Green Jr M.D.   On: 09/22/2019 11:51   CT Head Wo Contrast  Result Date: 09/21/2019 CLINICAL DATA:  Dysphagia EXAM: CT HEAD WITHOUT CONTRAST CT NECK WITHOUT CONTRAST TECHNIQUE: Contiguous axial images were obtained from the base of the skull through the vertex without contrast. Multidetector CT imaging of the neck was performed using the standard protocol without intravenous contrast. COMPARISON:  MRI brain 03/30/2019 FINDINGS: CT HEAD Brain: There is no acute intracranial hemorrhage mass effect or edema. 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 Gray-white differentiation is otherwise preserved. Confluent areas of hypoattenuation in the supratentorial white matter nonspecific but may reflect moderate to advanced chronic microvascular ischemic changes. There is ex vacuo dilatation lateral ventricles. No extra-axial fluid collection. Vascular: There is intracranial atherosclerotic calcification at the skull base. Skull: Unremarkable. Orbits:  Unremarkable. CT NECK Pharynx and larynx: Unremarkable without mass or swelling. Salivary glands: Unremarkable. Thyroid: Subcentimeter hypodense nodule of the left thyroid lobe. No further follow-up required per current guidelines. Lymph nodes: No enlarged lymph nodes. Vascular: Calcified plaque at the common carotid bifurcations. Mastoids and visualized paranasal sinuses: Patchy retained secretions in right sphenoid and posterior ethmoid sinuses. Mild left maxillary sinus mucosal thickening. Included mastoid air cells are clear. Skeleton: Degenerative changes of the included spine. Upper chest: No apical lung mass. Other: None. IMPRESSION: No acute intracranial abnormality. Stable chronic findings detailed above No neck mass or adenopathy. Electronically Signed   By: Guadlupe SpanishPraneil  Patel M.D.   On: 09/21/2019 13:00   CT Soft Tissue Neck Wo Contrast  Result Date: 09/21/2019 CLINICAL DATA:  Dysphagia EXAM: CT HEAD WITHOUT CONTRAST CT NECK WITHOUT CONTRAST TECHNIQUE: Contiguous axial images were obtained from the base of the skull through the vertex without contrast. Multidetector CT imaging of the neck was performed using the standard protocol without intravenous contrast. COMPARISON:  MRI brain 03/30/2019 FINDINGS: CT HEAD Brain: There is no acute intracranial hemorrhage mass effect or edema. 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 Gray-white differentiation is otherwise preserved. Confluent areas of hypoattenuation in the supratentorial white matter nonspecific but may reflect moderate to advanced chronic microvascular ischemic changes. There is ex vacuo dilatation lateral ventricles. No extra-axial fluid collection. Vascular: There is intracranial atherosclerotic calcification at the skull base. Skull: Unremarkable. Orbits: Unremarkable. CT NECK Pharynx and larynx: Unremarkable without mass or swelling. Salivary glands: Unremarkable. Thyroid: Subcentimeter hypodense nodule of the left thyroid lobe. No further  follow-up required per current guidelines. Lymph nodes: No enlarged lymph nodes. Vascular: Calcified plaque at the common carotid bifurcations. Mastoids and visualized paranasal sinuses: Patchy retained secretions in right sphenoid and posterior ethmoid sinuses. Mild left maxillary sinus mucosal thickening. Included mastoid air cells are clear. Skeleton: Degenerative changes of the included spine. Upper chest: No apical lung mass. Other: None. IMPRESSION: No acute intracranial abnormality. Stable chronic findings detailed above No neck mass or adenopathy. Electronically Signed  By: Guadlupe Spanish M.D.   On: 09/21/2019 13:00   CT ANGIO NECK W OR WO CONTRAST  Result Date: 09/22/2019 CLINICAL DATA:  Ataxia, stroke suspected. Limb drift. Right-sided facial numbness. Right-sided weakness. Abnormal speech. EXAM: CT ANGIOGRAPHY HEAD AND NECK TECHNIQUE: Multidetector CT imaging of the head and neck was performed using the standard protocol during bolus administration of intravenous contrast. Multiplanar CT image reconstructions and MIPs were obtained to evaluate the vascular anatomy. Carotid stenosis measurements (when applicable) are obtained utilizing NASCET criteria, using the distal internal carotid diameter as the denominator. CONTRAST:  OMNIPAQUE IOHEXOL 350 MG/ML SOLN COMPARISON:  CT head without contrast 09/22/19. FINDINGS: CTA NECK FINDINGS Aortic arch: A 3 vessel arch configuration is present. Atherosclerotic changes are noted within the arch and origins the great vessels without aneurysm focal stenosis. Right carotid system: Atherosclerotic changes are present at the right carotid bifurcation. Minimal luminal diameter is 3.5 mm. Mild tortuosity is present cervical right ICA without other significant stenosis. Left carotid system: The left common carotid artery within normal limits. Atherosclerotic calcifications are present at the left carotid bifurcation proximal left ICA. Minimal luminal diameter is 3  mm. Cervical left ICA is otherwise normal Vertebral arteries: Left vertebral artery is the dominant vessel. Both vertebral arteries originate from the subclavian arteries. High-grade stenosis is present the origin of the right vertebral artery. The right vertebral artery is occluded the V3 segment. Left vertebral artery is intact. Skeleton: Cervical spine is fused C4-7. Grade 1 anterolisthesis is present at C3-4. Alignment is otherwise intact. Vertebral body heights are normal. No focal lytic or blastic lesions are present Other neck: The soft tissues the neck are unremarkable. Upper chest: Lung apices are clear. Thoracic inlet is within normal limits. Review of the MIP images confirms the above findings CTA HEAD FINDINGS Anterior circulation: Atherosclerotic calcifications are present within the cavernous internal carotid arteries bilaterally. No significant stenosis is present through the ICA termini. The A1 and M1 segments are normal. The anterior communicating artery is patent. MCA bifurcations are intact bilaterally. The ACA and MCA branch vessels are normal. Posterior circulation: Left vertebral artery feeds basilar. The V4 segment is reconstituted into the right AICA. The basilar artery is normal. Both posterior cerebral arteries originate from basilar tip. The PCA branch vessels are within bilaterally. Venous sinuses: The dural sinuses are patent. Straight sinus deep cerebral veins are intact. Cortical veins are unremarkable. No vascular malformations are present. Anatomic variants: None Review of the MIP images confirms the above findings IMPRESSION: 1. 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 2. The left vertebral artery is dominant. 3. The basilar artery is reconstituted into the right AICA. 4. Atherosclerotic changes at the carotid bifurcations bilaterally without significant stenosis. 5. No significant  proximal stenosis, aneurysm, or branch vessel occlusion within the Circle of Willis. Electronically Signed   By: Marin Roberts M.D.   On: 09/22/2019 21:54   MR BRAIN WO CONTRAST  Result Date: 09/21/2019 CLINICAL DATA:  Dizziness, headache EXAM: MRI HEAD WITHOUT CONTRAST TECHNIQUE: Multiplanar, multiecho pulse sequences of the brain and surrounding structures were obtained without intravenous contrast. COMPARISON:  October 2020 FINDINGS: Motion artifact is present. Brain: There is no acute infarction or intracranial hemorrhage. There is no intracranial mass, mass effect, or edema. There is no hydrocephalus or extra-axial fluid collection. Confluent areas of T2 hyperintensity in the supratentorial white matter are nonspecific probably reflect moderate  chronic microvascular ischemic changes. 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 Small chronic cerebellar infarcts are again identified. Prominence of the ventricles and sulci reflects stable parenchymal volume loss. Vascular: Loss of the expected flow void of the intracranial proximal to mid right vertebral artery is again noted. This may reflect chronic occlusion or slow flow. Major vessel flow voids at the skull base are otherwise preserved. Skull and upper cervical spine: Normal marrow signal is preserved. Sinuses/Orbits: Mild mucosal thickening.  Orbits are unremarkable. Other: Sella is unremarkable.  Mastoid air cells are clear. IMPRESSION: No evidence of recent infarction, hemorrhage, or mass. Stable chronic findings detailed above. Electronically Signed   By: Guadlupe Spanish M.D.   On: 09/21/2019 16:59   US Carotid Bilateral (at [facility] and [facility] only)  Result Date: 09/21/2019 CLINICAL DATA:  84 year old male with a history of dizziness EXAM: BILATERAL CAROTID DUPLEX ULTRASOUND TECHNIQUE: Wallace Cullens scale imaging, color Doppler and duplex ultrasound were performed of bilateral carotid and vertebral arteries in the neck. COMPARISON:  None. FINDINGS: Criteria:  Quantification of carotid stenosis is based on velocity parameters that correlate the residual internal carotid diameter with NASCET-based stenosis levels, using the diameter of the distal internal carotid lumen as the denominator for stenosis measurement. The following velocity measurements were obtained: RIGHT ICA:  Systolic 103 cm/sec, Diastolic 23 cm/sec CCA:  106 cm/sec SYSTOLIC ICA/CCA RATIO:  0.97 ECA:  120 cm/sec LEFT ICA:  Systolic 124 cm/sec, Diastolic 26 cm/sec CCA:  91 cm/sec SYSTOLIC ICA/CCA RATIO:  1.3 ECA:  93 cm/sec Right Brachial SBP: Not acquired Left Brachial SBP: Not acquired RIGHT CAROTID ARTERY: No significant calcifications of the right common carotid artery. Intermediate waveform maintained. Heterogeneous and partially calcified plaque at the right carotid bifurcation. No significant lumen shadowing. Low resistance waveform of the right ICA. Tortuosity RIGHT VERTEBRAL ARTERY: Antegrade flow with low resistance waveform. LEFT CAROTID ARTERY: No significant calcifications of the left common carotid artery. Intermediate waveform maintained. Heterogeneous and partially calcified plaque at the left carotid bifurcation without significant lumen shadowing. Low resistance waveform of the left ICA. Tortuosity LEFT VERTEBRAL ARTERY:  Antegrade flow with low resistance waveform. IMPRESSION: Color duplex indicates minimal heterogeneous and calcified plaque, with no hemodynamically significant stenosis by duplex criteria in the extracranial cerebrovascular circulation. Signed, Yvone Neu. Reyne Dumas, RPVI [facility] Electronically Signed   By: Gilmer Mor D.O.   On: 09/21/2019 16:22   DG CHEST PORT 1 VIEW  Result Date: 09/27/2019 CLINICAL DATA:  Post intubation EXAM: PORTABLE CHEST 1 VIEW COMPARISON:  09/27/2019 FINDINGS: Interval intubation. Tip of the endotracheal tube is just above the carina. Esophageal tube tip overlies the gastric  outlet. Low lung volumes. Mild opacity at the left base. Stable cardiomediastinal silhouette. No pneumothorax. IMPRESSION: 1. Endotracheal tube tip just above the carina 2. Esophageal tube tip overlies the gastric outlet 3. Low lung volumes. Increased opacity at the medial left base, atelectasis versus aspiration. Electronically Signed   By: Jasmine Pang M.D.   On: 09/27/2019 21:10   DG CHEST PORT 1 VIEW  Result Date: 09/27/2019 CLINICAL DATA:  84 year old male with hypoxia. EXAM: PORTABLE CHEST 1 VIEW COMPARISON:  Chest radiograph dated 09/21/2019. FINDINGS: Shallow inspiration with bibasilar atelectasis. No focal consolidation, pleural effusion, pneumothorax. Stable cardiomediastinal silhouette. No acute osseous pathology. IMPRESSION: No active disease. Electronically Signed   By: Elgie Collard M.D.   On: 09/27/2019 16:27   DG Chest Portable 1 View  Result Date: 09/21/2019 CLINICAL DATA:  Dysphagia  EXAM: PORTABLE CHEST 1 VIEW COMPARISON:  10/10/2012.  CT 11/28/2018 FINDINGS: Heart is normal size. No confluent opacities, effusions or edema. No acute bony abnormality. IMPRESSION: No active disease. Electronically Signed   By: Charlett Nose M.D.   On: 09/21/2019 10:56   DG Swallowing Func-Speech Pathology  Result Date: 09/25/2019 Objective Swallowing Evaluation: Type of Study: MBS-Modified Barium Swallow Study  Patient Details Name: Donald Conley MRN: [id number] Date of Birth: March 31, 1936 Today's Date: 09/25/2019 Time: SLP Start Time (ACUTE ONLY): 1224 -SLP Stop Time (ACUTE ONLY): 1257 SLP Time Calculation (min) (ACUTE ONLY): 33 min Past Medical History: Past Medical History: Diagnosis Date . Allergy  . Arthritis  . Cataract  . GERD (gastroesophageal reflux disease)  Past Surgical History: Past Surgical History: Procedure Laterality Date . REPLACEMENT TOTAL KNEE BILATERAL   . TOTAL HIP ARTHROPLASTY Left  . WRIST ARTHROPLASTY Right  HPI: Rankin Coolman is a 84 y.o. 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  Patient woke up earlier not noticing any significant abnormality,but after waking up and trying to eat breakfast noticing difficulty swallowing and at the moment was noted by family members difficulty on his speech and left facial droop.  Patient was brought to the hospital for further evaluation and management. MRI reveals "No evidence of recent infarction, hemorrhage, or mass. Stable chronic findings detailed above."  Subjective: "They gave me a suction in the room." Assessment / Plan / Recommendation CHL IP CLINICAL IMPRESSIONS 09/25/2019 Clinical Impression Pt presents with functionally severe pharyngeal phase dysphagia characterized by reduced tongue base retraction, epiglottic deflection, pharyngeal stripping, and UES relaxation resulting in severe pharyngeal residuals with variable episodes of aspiration (generally sensed with cough/throat clear) during and after the swallow. Pt with improved pharyngeal clearance (however not consistent) with head turn to the RIGHT with chin tuck for all po trials. This appeared to facilitate increased epiglottic deflection, pharyngeal stripping, and UES opening, however was not consistent. Pt with occasional episodes of trace aspiration from residuals with thin and nectar thick liquids. Pt is at risk for aspiration and impaired ability to meet nutrition/hydration needs due to acute stroke. 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 Recommend NPO with  dysphagia treatment (ice chips PRN after oral care and trials bites of puree with head turn to the RIGHT).  SLP Visit Diagnosis Dysphagia, pharyngeal phase (R13.13);Dysphagia, pharyngoesophageal phase (R13.14) Attention and concentration deficit following -- Frontal lobe and executive function deficit following -- Impact on safety and function Moderate aspiration risk;Severe aspiration risk;Risk for inadequate nutrition/hydration   CHL IP TREATMENT RECOMMENDATION 09/25/2019 Treatment Recommendations Therapy as outlined in treatment plan below   Prognosis 09/25/2019 Prognosis for Safe Diet Advancement Guarded Barriers to Reach Goals Severity of deficits Barriers/Prognosis Comment -- CHL IP DIET RECOMMENDATION 09/25/2019 SLP Diet Recommendations NPO;Alternative means - long-term;Ice chips PRN after oral care Liquid Administration via -- Medication Administration Via alternative means Compensations Small sips/bites;Multiple dry swallows after each bite/sip Postural Changes --   CHL IP OTHER RECOMMENDATIONS 09/25/2019 Recommended Consults Consider GI evaluation Oral Care Recommendations Oral care QID Other Recommendations --   CHL  IP FOLLOW UP RECOMMENDATIONS 09/25/2019 Follow up Recommendations Skilled Nursing facility   [facility] IP FREQUENCY AND DURATION 09/25/2019 Speech Therapy Frequency (ACUTE ONLY) min 2x/week Treatment Duration 1 week      CHL IP ORAL PHASE 09/25/2019 Oral Phase WFL Oral - Pudding Teaspoon -- Oral - Pudding Cup -- Oral - Honey Teaspoon -- Oral - Honey Cup -- Oral - Nectar Teaspoon -- Oral - Nectar Cup -- Oral - Nectar Straw -- Oral - Thin Teaspoon -- Oral - Thin Cup -- Oral - Thin Straw -- Oral - Puree -- Oral - Mech Soft -- Oral - Regular -- Oral - Multi-Consistency -- Oral - Pill -- Oral Phase - Comment --  CHL IP PHARYNGEAL PHASE 09/25/2019 Pharyngeal Phase Impaired Pharyngeal- Pudding Teaspoon -- Pharyngeal -- Pharyngeal- Pudding Cup -- Pharyngeal -- Pharyngeal- Honey Teaspoon Delayed swallow  initiation-vallecula;Reduced pharyngeal peristalsis;Reduced epiglottic inversion;Reduced tongue base retraction;Pharyngeal residue - valleculae;Pharyngeal residue - pyriform Pharyngeal -- Pharyngeal- Honey Cup -- Pharyngeal -- Pharyngeal- Nectar Teaspoon Delayed swallow initiation-pyriform sinuses;Reduced epiglottic inversion;Reduced pharyngeal peristalsis;Reduced airway/laryngeal closure;Reduced tongue base retraction;Penetration/Aspiration during swallow;Penetration/Apiration after swallow;Trace aspiration;Pharyngeal residue - valleculae;Pharyngeal residue - pyriform;Pharyngeal residue - cp segment Pharyngeal Material enters airway, passes BELOW cords then ejected out;Material enters airway, CONTACTS cords and not ejected out Pharyngeal- Nectar Cup -- Pharyngeal -- Pharyngeal- Nectar Straw -- Pharyngeal -- Pharyngeal- Thin Teaspoon Delayed swallow initiation-pyriform sinuses;Reduced pharyngeal peristalsis;Reduced epiglottic inversion;Reduced tongue base retraction;Penetration/Apiration after swallow;Trace aspiration;Pharyngeal residue - valleculae;Pharyngeal residue - pyriform Pharyngeal Material enters airway, passes BELOW cords and not ejected out despite cough attempt by patient;Material enters airway, passes BELOW cords then ejected out Pharyngeal- Thin Cup Reduced epiglottic inversion;Reduced pharyngeal peristalsis;Reduced airway/laryngeal closure;Penetration/Apiration after swallow;Trace aspiration;Pharyngeal residue - valleculae;Pharyngeal residue - pyriform Pharyngeal Material enters airway, passes BELOW cords then ejected out;Material enters airway, CONTACTS cords and not ejected out Pharyngeal- Thin Straw -- Pharyngeal -- Pharyngeal- Puree Delayed swallow initiation-vallecula;Reduced pharyngeal peristalsis;Reduced epiglottic inversion;Reduced tongue base retraction;Pharyngeal residue - valleculae;Pharyngeal residue - pyriform;Pharyngeal residue - posterior pharnyx Pharyngeal -- Pharyngeal- Mechanical  Soft -- Pharyngeal -- Pharyngeal- Regular -- Pharyngeal -- Pharyngeal- Multi-consistency -- Pharyngeal -- Pharyngeal- Pill -- Pharyngeal -- Pharyngeal Comment reduced tongue base retraction, epiglottic deflection, and pharyngeal stripping  CHL IP CERVICAL ESOPHAGEAL PHASE 09/25/2019 Cervical Esophageal Phase Impaired Pudding Teaspoon -- Pudding Cup -- Honey Teaspoon -- Honey Cup -- Nectar Teaspoon -- Nectar Cup -- Nectar Straw -- Thin Teaspoon Reduced cricopharyngeal relaxation;Prominent cricopharyngeal segment Thin Cup -- Thin Straw -- Puree -- Mechanical Soft -- Regular -- Multi-consistency -- Pill -- Cervical Esophageal Comment Reduced CP opening Thank you, Havery Moros, CCC-SLP [phone] PORTER,DABNEY 09/25/2019, 2:38 PM              ECHOCARDIOGRAM COMPLETE  Result Date: 09/22/2019    ECHOCARDIOGRAM REPORT   Patient Name:   SUSIE POUSSON Date of Exam: 09/22/2019 Medical Rec #:  [id number]      Height:       71.0 in Accession #:    [id number]     Weight:       200.0 lb Date of Birth:  [birth year]      BSA:          2.109 m Patient Age:    84 years       BP:           166/76 mmHg Patient Gender: M              HR:           63 bpm. Exam  Location:  [facility] Procedure: 2D Echo, Cardiac Doppler and Color Doppler Indications:    CVA  History:        Patient has no prior history of Echocardiogram examinations.                 Stroke and Obesity; Risk Factors:Hypertension, Dyslipidemia and                 Diabetes.  Sonographer:    Dustin Flock RDCS Referring Phys: [phone] CARLOS MADERA  Sonographer Comments: Patient's position. IMPRESSIONS  1. Left ventricular ejection fraction, by estimation, is 60 to 65%. The left ventricle has normal function. The left ventricle has no regional wall motion abnormalities. There is mild left ventricular hypertrophy of the posterior and basal-septal segments. Left ventricular diastolic parameters are consistent with Grade I diastolic dysfunction (impaired relaxation).  2. Right  ventricular systolic function is normal. The right ventricular size is normal. There is mildly elevated pulmonary artery systolic pressure.  3. The mitral valve is degenerative. No evidence of mitral valve regurgitation.  4. The aortic valve is tricuspid. Aortic valve regurgitation is not visualized. No aortic stenosis is present. FINDINGS  Left Ventricle: Left ventricular ejection fraction, by estimation, is 60 to 65%. The left ventricle has normal function. The left ventricle has no regional wall motion abnormalities. The left ventricular internal cavity size was normal in size. There is  mild left ventricular hypertrophy of the posterior and basal-septal segments. Left ventricular diastolic parameters are consistent with Grade I diastolic dysfunction (impaired relaxation). Indeterminate filling pressures. Right Ventricle: The right ventricular size is normal. No increase in right ventricular wall thickness. Right ventricular systolic function is normal. There is mildly elevated pulmonary artery systolic pressure. The tricuspid regurgitant velocity is 2.75  m/s, and with an assumed right atrial pressure of 3 mmHg, the estimated right ventricular systolic pressure is 73.7 mmHg. Left Atrium: Left atrial size was normal in size. Right Atrium: Right atrial size was normal in size. Pericardium: There is no evidence of pericardial effusion. Mitral Valve: The mitral valve is degenerative in appearance. There is mild thickening of the mitral valve leaflet(s). Mild mitral annular calcification. No evidence of mitral valve regurgitation. Tricuspid Valve: The tricuspid valve is grossly normal. Tricuspid valve regurgitation is mild. Aortic Valve: The aortic valve is tricuspid. . There is mild thickening of the aortic valve. Aortic valve regurgitation is not visualized. No aortic stenosis is present. Mild aortic valve annular calcification. There is mild thickening of the aortic valve. Pulmonic Valve: The pulmonic valve was  grossly normal. Pulmonic valve regurgitation is not visualized. Aorta: The aortic root is normal in size and structure. Venous: The inferior vena cava was not well visualized. IAS/Shunts: The interatrial septum was not well visualized.  LEFT VENTRICLE PLAX 2D LVIDd:         4.78 cm  Diastology LVIDs:         2.04 cm  LV e' lateral:   7.94 cm/s LV PW:         1.25 cm  LV E/e' lateral: 10.4 LV IVS:        0.97 cm  LV e' medial:    7.07 cm/s LVOT diam:     1.90 cm  LV E/e' medial:  11.7 LV SV:         90 LV SV Index:   43 LVOT Area:     2.84 cm  RIGHT VENTRICLE RV Basal diam:  3.00 cm RV S prime:  6.31 cm/s TAPSE (M-mode): 2.4 cm LEFT ATRIUM             Index LA diam:        3.10 cm 1.47 cm/m LA Vol (A2C):   61.1 ml 28.98 ml/m LA Vol (A4C):   49.5 ml 23.48 ml/m LA Biplane Vol: 60.7 ml 28.79 ml/m  AORTIC VALVE LVOT Vmax:   135.00 cm/s LVOT Vmean:  86.300 cm/s LVOT VTI:    0.317 m  AORTA Ao Root diam: 3.10 cm MITRAL VALVE               TRICUSPID VALVE MV Area (PHT): 2.59 cm    TR Peak grad:   30.2 mmHg MV Decel Time: 293 msec    TR Vmax:        275.00 cm/s MV E velocity: 82.60 cm/s MV A velocity: 96.80 cm/s  SHUNTS MV E/A ratio:  0.85        Systemic VTI:  0.32 m                            Systemic Diam: 1.90 cm Prentice Docker MD Electronically signed by Prentice Docker MD Signature Date/Time: 09/22/2019/2:31:19 PM    Final    [place] EKG SITE RITE  Result Date: 09/28/2019 If Site Rite image not attached, placement could not be confirmed due to current cardiac rhythm.        Discharge Exam: Vitals:   09/29/19 1000 09/29/19 1102  BP: (!) 179/70 119/72  Pulse:    Resp: (!) 27 (!) 9  Temp: 99 F (37.2 C) 99.1 F (37.3 C)  SpO2:     Vitals:   09/29/19 0800 09/29/19 0900 09/29/19 1000 09/29/19 1102  BP: (!) 163/65 (!) 158/77 (!) 179/70 119/72  Pulse:      Resp: (!) 30 12 (!) 27 (!) 9  Temp: 98.6 F (37 C) 98.8 F (37.1 C) 99 F (37.2 C) 99.1 F (37.3 C)  TempSrc:      SpO2:        Weight:      Height:        General: Pt is alert, awake, not in acute distress Cardiovascular: RRR, S1/S2 +, no rubs, no gallops Respiratory: bibasilar rales. No wheeze Abdominal: Soft, NT, ND, bowel sounds + Extremities: no edema, no cyanosis   The results of significant diagnostics from this hospitalization (including imaging, microbiology, ancillary and laboratory) are listed below for reference.    Significant Diagnostic Studies: CT ANGIO HEAD W OR WO CONTRAST  Result Date: 09/22/2019 CLINICAL DATA:  Ataxia, stroke suspected. Limb drift. Right-sided facial numbness. Right-sided weakness. Abnormal speech. EXAM: CT ANGIOGRAPHY HEAD AND NECK TECHNIQUE: Multidetector CT imaging of the head and neck was performed using the standard protocol during bolus administration of intravenous contrast. Multiplanar CT image reconstructions and MIPs were obtained to evaluate the vascular anatomy. Carotid stenosis measurements (when applicable) are obtained utilizing NASCET criteria, using the distal internal carotid diameter as the denominator. CONTRAST:  OMNIPAQUE IOHEXOL 350 MG/ML SOLN COMPARISON:  CT head without contrast 09/22/19. FINDINGS: CTA NECK FINDINGS Aortic arch: A 3 vessel arch configuration is present. Atherosclerotic changes are noted within the arch and origins the great vessels without aneurysm focal stenosis. Right carotid system: Atherosclerotic changes are present at the right carotid bifurcation. Minimal luminal diameter is 3.5 mm. Mild tortuosity is present cervical right ICA without other significant stenosis. Left carotid system: The left common carotid artery within  normal limits. Atherosclerotic calcifications are present at the left carotid bifurcation proximal left ICA. Minimal luminal diameter is 3 mm. Cervical left ICA is otherwise normal Vertebral arteries: Left vertebral artery is the dominant vessel. Both vertebral arteries originate from the subclavian arteries. High-grade  stenosis is present the origin of the right vertebral artery. The right vertebral artery is occluded the V3 segment. Left vertebral artery is intact. Skeleton: Cervical spine is fused C4-7. Grade 1 anterolisthesis is present at C3-4. Alignment is otherwise intact. Vertebral body heights are normal. No focal lytic or blastic lesions are present Other neck: The soft tissues the neck are unremarkable. Upper chest: Lung apices are clear. Thoracic inlet is within normal limits. Review of the MIP images confirms the above findings CTA HEAD FINDINGS Anterior circulation: Atherosclerotic calcifications are present within the cavernous internal carotid arteries bilaterally. No significant stenosis is present through the ICA termini. The A1 and M1 segments are normal. The anterior communicating artery is patent. MCA bifurcations are intact bilaterally. The ACA and MCA branch vessels are normal. Posterior circulation: Left vertebral artery feeds basilar. The V4 segment is reconstituted into the right AICA. The basilar artery is normal. Both posterior cerebral arteries originate from basilar tip. The PCA branch vessels are within bilaterally. Venous sinuses: The dural sinuses are patent. Straight sinus deep cerebral veins are intact. Cortical veins are unremarkable. No vascular malformations are present. Anatomic variants: None Review of the MIP images confirms the above findings IMPRESSION: 1. 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 2. The left vertebral artery is dominant. 3. The basilar artery is reconstituted into the right AICA. 4. Atherosclerotic changes at the carotid bifurcations bilaterally without significant stenosis. 5. No significant proximal stenosis, aneurysm, or branch vessel occlusion within the Circle of Willis. Electronically Signed   By: Marin Roberts M.D.   On: 09/22/2019 21:54   CT HEAD WO  CONTRAST  Result Date: 09/28/2019 CLINICAL DATA:  Stroke, follow-up EXAM: CT HEAD WITHOUT CONTRAST TECHNIQUE: Contiguous axial images were obtained from the base of the skull through the vertex without intravenous contrast. COMPARISON:  09/22/2019 FINDINGS: Brain: There is atrophy and chronic small vessel disease changes. No acute intracranial abnormality. Specifically, no hemorrhage, hydrocephalus, mass lesion, acute infarction, or significant intracranial injury. Vascular: No hyperdense vessel or unexpected calcification. Skull: No acute calvarial abnormality. Sinuses/Orbits: Visualized paranasal sinuses and mastoids clear. Orbital soft tissues unremarkable. Other: Extensive air-fluid levels throughout the paranasal sinuses, new since prior study. IMPRESSION: Atrophy, chronic microvascular disease. No acute intracranial abnormality. Electronically Signed   By: Charlett Nose M.D.   On: 09/28/2019 02:16   CT HEAD WO CONTRAST  Result Date: 09/22/2019 CLINICAL DATA:  Slurred speech. Dizziness EXAM: CT HEAD WITHOUT CONTRAST TECHNIQUE: Contiguous axial images were obtained from the base of the skull through the vertex without intravenous contrast. COMPARISON:  September 21, 2019. FINDINGS: Brain: Mild chronic ischemic white matter disease is noted. No mass effect or midline shift is noted. Ventricular size is within normal limits. There is no evidence of mass lesion, hemorrhage or acute infarction. Vascular: No hyperdense vessel or unexpected calcification. Skull: Normal. Negative for fracture or focal lesion. Sinuses/Orbits: Minimal right sphenoid sinusitis is noted Other: None. IMPRESSION: Mild chronic ischemic white matter disease. Minimal right sphenoid sinusitis. No acute intracranial abnormality seen.31 Electronically Signed   By: Lupita Raider M.D.   On: 09/22/2019 11:51   CT Head Wo  Contrast  Result Date: 09/21/2019 CLINICAL DATA:  Dysphagia EXAM: CT HEAD WITHOUT CONTRAST CT NECK WITHOUT CONTRAST  TECHNIQUE: Contiguous axial images were obtained from the base of the skull through the vertex without contrast. Multidetector CT imaging of the neck was performed using the standard protocol without intravenous contrast. COMPARISON:  MRI brain 03/30/2019 FINDINGS: CT HEAD Brain: There is no acute intracranial hemorrhage mass effect or edema. 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 Gray-white differentiation is otherwise preserved. Confluent areas of hypoattenuation in the supratentorial white matter nonspecific but may reflect moderate to advanced chronic microvascular ischemic changes. There is ex vacuo dilatation lateral ventricles. No extra-axial fluid collection. Vascular: There is intracranial atherosclerotic calcification at the skull base. Skull: Unremarkable. Orbits: Unremarkable. CT NECK Pharynx and larynx: Unremarkable without mass or swelling. Salivary glands: Unremarkable. Thyroid: Subcentimeter hypodense nodule of the left thyroid lobe. No further follow-up required per current guidelines. Lymph nodes: No enlarged lymph nodes. Vascular: Calcified plaque at the common carotid bifurcations. Mastoids and visualized paranasal sinuses: Patchy retained secretions in right sphenoid and posterior ethmoid sinuses. Mild left maxillary sinus mucosal thickening. Included mastoid air cells are clear. Skeleton: Degenerative changes of the included spine. Upper chest: No apical lung mass. Other: None. IMPRESSION: No acute intracranial abnormality. Stable chronic findings detailed above No neck mass or adenopathy. Electronically Signed   By: Guadlupe Spanish M.D.   On: 09/21/2019 13:00   CT Soft Tissue Neck Wo Contrast  Result Date: 09/21/2019 CLINICAL DATA:  Dysphagia EXAM: CT HEAD WITHOUT CONTRAST CT NECK WITHOUT CONTRAST TECHNIQUE: Contiguous axial images were obtained from the base of the skull through the vertex without contrast. Multidetector CT imaging of the neck was performed using the standard protocol  without intravenous contrast. COMPARISON:  MRI brain 03/30/2019 FINDINGS: CT HEAD Brain: There is no acute intracranial hemorrhage mass effect or edema. 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 Gray-white differentiation is otherwise preserved. Confluent areas of hypoattenuation in the supratentorial white matter nonspecific but may reflect moderate to advanced chronic microvascular ischemic changes. There is ex vacuo dilatation lateral ventricles. No extra-axial fluid collection. Vascular: There is intracranial atherosclerotic calcification at the skull base. Skull: Unremarkable. Orbits: Unremarkable. CT NECK Pharynx and larynx: Unremarkable without mass or swelling. Salivary glands: Unremarkable. Thyroid: Subcentimeter hypodense nodule of the left thyroid lobe. No further follow-up required per current guidelines. Lymph nodes: No enlarged lymph nodes. Vascular: Calcified plaque at the common carotid bifurcations. Mastoids and visualized paranasal sinuses: Patchy retained secretions in right sphenoid and posterior ethmoid sinuses. Mild left maxillary sinus mucosal thickening. Included mastoid air cells are clear. Skeleton: Degenerative changes of the included spine. Upper chest: No apical lung mass. Other: None. IMPRESSION: No acute intracranial abnormality. Stable chronic findings detailed above No neck mass or adenopathy. Electronically Signed   By: Guadlupe Spanish M.D.   On: 09/21/2019 13:00   CT ANGIO NECK W OR WO CONTRAST  Result Date: 09/22/2019 CLINICAL DATA:  Ataxia, stroke suspected. Limb drift. Right-sided facial numbness. Right-sided weakness. Abnormal speech. EXAM: CT ANGIOGRAPHY HEAD AND NECK TECHNIQUE: Multidetector CT imaging of the head and neck was performed using the standard protocol during bolus administration of intravenous contrast. Multiplanar CT image reconstructions and MIPs were obtained to evaluate the vascular anatomy. Carotid stenosis measurements (when applicable) are obtained  utilizing NASCET criteria, using the distal internal carotid diameter as the denominator. CONTRAST:  OMNIPAQUE IOHEXOL 350 MG/ML SOLN COMPARISON:  CT head without contrast 09/22/19. FINDINGS: CTA NECK FINDINGS Aortic arch: A 3  vessel arch configuration is present. Atherosclerotic changes are noted within the arch and origins the great vessels without aneurysm focal stenosis. Right carotid system: Atherosclerotic changes are present at the right carotid bifurcation. Minimal luminal diameter is 3.5 mm. Mild tortuosity is present cervical right ICA without other significant stenosis. Left carotid system: The left common carotid artery within normal limits. Atherosclerotic calcifications are present at the left carotid bifurcation proximal left ICA. Minimal luminal diameter is 3 mm. Cervical left ICA is otherwise normal Vertebral arteries: Left vertebral artery is the dominant vessel. Both vertebral arteries originate from the subclavian arteries. High-grade stenosis is present the origin of the right vertebral artery. The right vertebral artery is occluded the V3 segment. Left vertebral artery is intact. Skeleton: Cervical spine is fused C4-7. Grade 1 anterolisthesis is present at C3-4. Alignment is otherwise intact. Vertebral body heights are normal. No focal lytic or blastic lesions are present Other neck: The soft tissues the neck are unremarkable. Upper chest: Lung apices are clear. Thoracic inlet is within normal limits. Review of the MIP images confirms the above findings CTA HEAD FINDINGS Anterior circulation: Atherosclerotic calcifications are present within the cavernous internal carotid arteries bilaterally. No significant stenosis is present through the ICA termini. The A1 and M1 segments are normal. The anterior communicating artery is patent. MCA bifurcations are intact bilaterally. The ACA and MCA branch vessels are normal. Posterior circulation: Left vertebral artery feeds basilar. The V4 segment is  reconstituted into the right AICA. The basilar artery is normal. Both posterior cerebral arteries originate from basilar tip. The PCA branch vessels are within bilaterally. Venous sinuses: The dural sinuses are patent. Straight sinus deep cerebral veins are intact. Cortical veins are unremarkable. No vascular malformations are present. Anatomic variants: None Review of the MIP images confirms the above findings IMPRESSION: 1. 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 2. The left vertebral artery is dominant. 3. The basilar artery is reconstituted into the right AICA. 4. Atherosclerotic changes at the carotid bifurcations bilaterally without significant stenosis. 5. No significant proximal stenosis, aneurysm, or branch vessel occlusion within the Circle of Willis. Electronically Signed   By: Marin Roberts M.D.   On: 09/22/2019 21:54   MR BRAIN WO CONTRAST  Result Date: 09/21/2019 CLINICAL DATA:  Dizziness, headache EXAM: MRI HEAD WITHOUT CONTRAST TECHNIQUE: Multiplanar, multiecho pulse sequences of the brain and surrounding structures were obtained without intravenous contrast. COMPARISON:  October 2020 FINDINGS: Motion artifact is present. Brain: There is no acute infarction or intracranial hemorrhage. There is no intracranial mass, mass effect, or edema. There is no hydrocephalus or extra-axial fluid collection. Confluent areas of T2 hyperintensity in the supratentorial white matter are nonspecific probably reflect moderate chronic microvascular ischemic changes. 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 Small chronic cerebellar infarcts are again identified. Prominence of the ventricles and sulci reflects stable parenchymal volume loss. Vascular: Loss of the expected flow void of the intracranial proximal to mid right vertebral artery is again noted. This may reflect chronic occlusion or slow flow. Major  vessel flow voids at the skull base are otherwise preserved. Skull and upper cervical spine: Normal marrow signal is preserved. Sinuses/Orbits: Mild mucosal thickening.  Orbits are unremarkable. Other: Sella is unremarkable.  Mastoid air cells are clear. IMPRESSION: No evidence of recent infarction, hemorrhage, or mass. Stable chronic findings detailed above. Electronically Signed   By: Jackquline Berlin.D.  On: 09/21/2019 16:59   US Carotid Bilateral (at [facility] and [facility] only)  Result Date: 09/21/2019 CLINICAL DATA:  84 year old male with a history of dizziness EXAM: BILATERAL CAROTID DUPLEX ULTRASOUND TECHNIQUE: Wallace Cullens scale imaging, color Doppler and duplex ultrasound were performed of bilateral carotid and vertebral arteries in the neck. COMPARISON:  None. FINDINGS: Criteria: Quantification of carotid stenosis is based on velocity parameters that correlate the residual internal carotid diameter with NASCET-based stenosis levels, using the diameter of the distal internal carotid lumen as the denominator for stenosis measurement. The following velocity measurements were obtained: RIGHT ICA:  Systolic 103 cm/sec, Diastolic 23 cm/sec CCA:  106 cm/sec SYSTOLIC ICA/CCA RATIO:  0.97 ECA:  120 cm/sec LEFT ICA:  Systolic 124 cm/sec, Diastolic 26 cm/sec CCA:  91 cm/sec SYSTOLIC ICA/CCA RATIO:  1.3 ECA:  93 cm/sec Right Brachial SBP: Not acquired Left Brachial SBP: Not acquired RIGHT CAROTID ARTERY: No significant calcifications of the right common carotid artery. Intermediate waveform maintained. Heterogeneous and partially calcified plaque at the right carotid bifurcation. No significant lumen shadowing. Low resistance waveform of the right ICA. Tortuosity RIGHT VERTEBRAL ARTERY: Antegrade flow with low resistance waveform. LEFT CAROTID ARTERY: No significant calcifications of the left common carotid artery. Intermediate waveform maintained. Heterogeneous and partially calcified plaque at the left carotid bifurcation  without significant lumen shadowing. Low resistance waveform of the left ICA. Tortuosity LEFT VERTEBRAL ARTERY:  Antegrade flow with low resistance waveform. IMPRESSION: Color duplex indicates minimal heterogeneous and calcified plaque, with no hemodynamically significant stenosis by duplex criteria in the extracranial cerebrovascular circulation. Signed, Yvone Neu. Reyne Dumas, RPVI [facility] Electronically Signed   By: Gilmer Mor D.O.   On: 09/21/2019 16:22   DG CHEST PORT 1 VIEW  Result Date: 09/27/2019 CLINICAL DATA:  Post intubation EXAM: PORTABLE CHEST 1 VIEW COMPARISON:  09/27/2019 FINDINGS: Interval intubation. Tip of the endotracheal tube is just above the carina. Esophageal tube tip overlies the gastric outlet. Low lung volumes. Mild opacity at the left base. Stable cardiomediastinal silhouette. No pneumothorax. IMPRESSION: 1. Endotracheal tube tip just above the carina 2. Esophageal tube tip overlies the gastric outlet 3. Low lung volumes. Increased opacity at the medial left base, atelectasis versus aspiration. Electronically Signed   By: Jasmine Pang M.D.   On: 09/27/2019 21:10   DG CHEST PORT 1 VIEW  Result Date: 09/27/2019 CLINICAL DATA:  84 year old male with hypoxia. EXAM: PORTABLE CHEST 1 VIEW COMPARISON:  Chest radiograph dated 09/21/2019. FINDINGS: Shallow inspiration with bibasilar atelectasis. No focal consolidation, pleural effusion, pneumothorax. Stable cardiomediastinal silhouette. No acute osseous pathology. IMPRESSION: No active disease. Electronically Signed   By: Elgie Collard M.D.   On: 09/27/2019 16:27   DG Chest Portable 1 View  Result Date: 09/21/2019 CLINICAL DATA:  Dysphagia EXAM: PORTABLE CHEST 1 VIEW COMPARISON:  10/10/2012.  CT 11/28/2018 FINDINGS: Heart is normal size. No confluent opacities, effusions or edema. No acute bony abnormality. IMPRESSION: No active disease. Electronically Signed   By: Charlett Nose M.D.   On: 09/21/2019 10:56   DG Swallowing Func-Speech Pathology  Result Date: 09/25/2019 Objective Swallowing Evaluation: Type of Study: MBS-Modified Barium Swallow Study  Patient Details Name: Donald Conley MRN: [id number] Date of Birth: 1935/07/15 Today's Date: 09/25/2019 Time: SLP Start Time (ACUTE ONLY): 1224 -SLP Stop Time (ACUTE ONLY): 1257 SLP Time Calculation (min) (ACUTE ONLY): 33 min Past Medical History: Past Medical History: Diagnosis Date . Allergy  . Arthritis  . Cataract  . GERD (gastroesophageal  reflux disease)  Past Surgical History: Past Surgical History: Procedure Laterality Date . REPLACEMENT TOTAL KNEE BILATERAL   . TOTAL HIP ARTHROPLASTY Left  . WRIST ARTHROPLASTY Right  HPI: Dontavius Keim is a 84 y.o. 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  Patient woke up earlier not noticing any significant abnormality,but after waking up and trying to eat breakfast noticing difficulty swallowing and at the moment was noted by family members difficulty on his speech and left facial droop.  Patient was brought to the hospital for further evaluation and management. MRI reveals "No evidence of recent infarction, hemorrhage, or mass. Stable chronic findings detailed above."  Subjective: "They gave me a suction in the room." Assessment / Plan / Recommendation CHL IP CLINICAL IMPRESSIONS 09/25/2019 Clinical Impression Pt presents with functionally severe pharyngeal phase dysphagia characterized by reduced tongue base retraction, epiglottic deflection, pharyngeal stripping, and UES relaxation resulting in severe pharyngeal residuals with variable episodes  of aspiration (generally sensed with cough/throat clear) during and after the swallow. Pt with improved pharyngeal clearance (however not consistent) with head turn to the RIGHT with chin tuck for all po trials. This appeared to facilitate increased epiglottic deflection, pharyngeal stripping, and UES opening, however was not consistent. Pt with occasional episodes of trace aspiration from residuals with thin and nectar thick liquids. Pt is at risk for aspiration and impaired ability to meet nutrition/hydration needs due to acute stroke. 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 Recommend NPO with dysphagia treatment (ice chips PRN after oral care and trials bites of puree with head turn to the RIGHT).  SLP Visit Diagnosis Dysphagia, pharyngeal phase (R13.13);Dysphagia, pharyngoesophageal phase (R13.14) Attention and concentration deficit following -- Frontal lobe and executive function deficit following -- Impact on safety and function Moderate aspiration risk;Severe aspiration risk;Risk for inadequate nutrition/hydration   CHL IP TREATMENT RECOMMENDATION 09/25/2019 Treatment Recommendations Therapy as outlined in treatment plan below   Prognosis 09/25/2019 Prognosis for Safe Diet Advancement Guarded Barriers to Reach Goals Severity of deficits Barriers/Prognosis Comment -- CHL IP DIET RECOMMENDATION 09/25/2019 SLP Diet Recommendations NPO;Alternative means - long-term;Ice chips PRN after oral care Liquid Administration via -- Medication Administration Via alternative means Compensations Small sips/bites;Multiple dry swallows after each bite/sip Postural Changes --   CHL IP OTHER RECOMMENDATIONS 09/25/2019 Recommended Consults Consider GI evaluation Oral Care Recommendations Oral care QID Other Recommendations --   CHL IP FOLLOW UP RECOMMENDATIONS  09/25/2019 Follow up Recommendations Skilled Nursing facility   [facility] IP FREQUENCY AND DURATION 09/25/2019 Speech Therapy Frequency (ACUTE ONLY) min 2x/week Treatment Duration 1 week      CHL IP ORAL PHASE 09/25/2019 Oral Phase WFL Oral - Pudding Teaspoon -- Oral - Pudding Cup -- Oral - Honey Teaspoon -- Oral - Honey Cup -- Oral - Nectar Teaspoon -- Oral - Nectar Cup -- Oral - Nectar Straw -- Oral - Thin Teaspoon -- Oral - Thin Cup -- Oral - Thin Straw -- Oral - Puree -- Oral - Mech Soft -- Oral - Regular -- Oral - Multi-Consistency -- Oral - Pill -- Oral Phase - Comment --  CHL IP PHARYNGEAL PHASE 09/25/2019 Pharyngeal Phase Impaired Pharyngeal- Pudding Teaspoon -- Pharyngeal -- Pharyngeal- Pudding Cup -- Pharyngeal -- Pharyngeal- Honey Teaspoon Delayed swallow initiation-vallecula;Reduced pharyngeal peristalsis;Reduced epiglottic inversion;Reduced tongue base retraction;Pharyngeal residue - valleculae;Pharyngeal residue - pyriform Pharyngeal -- Pharyngeal- Honey Cup -- Pharyngeal -- Pharyngeal- Nectar Teaspoon Delayed swallow initiation-pyriform sinuses;Reduced epiglottic inversion;Reduced pharyngeal peristalsis;Reduced airway/laryngeal closure;Reduced tongue base retraction;Penetration/Aspiration during swallow;Penetration/Apiration after swallow;Trace aspiration;Pharyngeal residue - valleculae;Pharyngeal residue - pyriform;Pharyngeal residue - cp segment Pharyngeal Material enters airway, passes BELOW cords then ejected out;Material enters airway, CONTACTS cords and not ejected out Pharyngeal- Nectar Cup -- Pharyngeal -- Pharyngeal- Nectar Straw -- Pharyngeal -- Pharyngeal- Thin Teaspoon Delayed swallow initiation-pyriform sinuses;Reduced pharyngeal peristalsis;Reduced epiglottic inversion;Reduced tongue base retraction;Penetration/Apiration after swallow;Trace aspiration;Pharyngeal residue - valleculae;Pharyngeal residue - pyriform Pharyngeal Material enters airway, passes BELOW cords and not ejected out despite  cough attempt by patient;Material enters airway, passes BELOW cords then ejected out Pharyngeal- Thin Cup Reduced epiglottic inversion;Reduced pharyngeal peristalsis;Reduced airway/laryngeal closure;Penetration/Apiration after swallow;Trace aspiration;Pharyngeal residue - valleculae;Pharyngeal residue - pyriform Pharyngeal Material enters airway, passes BELOW cords then ejected out;Material enters airway, CONTACTS cords and not ejected out Pharyngeal- Thin Straw -- Pharyngeal -- Pharyngeal- Puree Delayed swallow initiation-vallecula;Reduced pharyngeal peristalsis;Reduced epiglottic inversion;Reduced tongue base retraction;Pharyngeal residue - valleculae;Pharyngeal residue - pyriform;Pharyngeal residue - posterior pharnyx Pharyngeal -- Pharyngeal- Mechanical Soft -- Pharyngeal -- Pharyngeal- Regular -- Pharyngeal -- Pharyngeal- Multi-consistency -- Pharyngeal -- Pharyngeal- Pill -- Pharyngeal -- Pharyngeal Comment reduced tongue base retraction, epiglottic deflection, and pharyngeal stripping  CHL IP CERVICAL ESOPHAGEAL PHASE 09/25/2019 Cervical Esophageal Phase Impaired Pudding Teaspoon -- Pudding Cup -- Honey Teaspoon -- Honey Cup -- Nectar Teaspoon -- Nectar Cup -- Nectar Straw -- Thin Teaspoon Reduced cricopharyngeal relaxation;Prominent cricopharyngeal segment Thin Cup -- Thin Straw -- Puree -- Mechanical Soft -- Regular -- Multi-consistency -- Pill -- Cervical Esophageal Comment Reduced CP opening Thank you, Havery Moros, CCC-SLP [phone] PORTER,DABNEY 09/25/2019, 2:38 PM              ECHOCARDIOGRAM COMPLETE  Result Date: 09/22/2019    ECHOCARDIOGRAM REPORT   Patient Name:   Donald Conley Date of Exam: 09/22/2019 Medical Rec #:  [id number]      Height:       71.0 in Accession #:    [id number]     Weight:       200.0 lb Date of Birth:  1935-08-09      BSA:          2.109 m Patient Age:    84 years       BP:           166/76 mmHg Patient Gender: M              HR:           63 bpm. [facility] Procedure: 2D Echo, Cardiac Doppler and Color Doppler Indications:    CVA  History:        Patient has no prior history of Echocardiogram examinations.                 Stroke and Obesity; Risk Factors:Hypertension, Dyslipidemia and                 Diabetes.  Sonographer:    Lavenia Atlas RDCS Referring Phys: [phone] CARLOS MADERA  Sonographer Comments: Patient's position. IMPRESSIONS  1. Left ventricular ejection fraction, by estimation, is 60 to 65%. The left ventricle has normal function. The left ventricle has no regional wall motion abnormalities.  There is mild left ventricular hypertrophy of the posterior and basal-septal segments. Left ventricular diastolic parameters are consistent with Grade I diastolic dysfunction (impaired relaxation).  2. Right ventricular systolic function is normal. The right ventricular size is normal. There is mildly elevated pulmonary artery systolic pressure.  3. The mitral valve is degenerative. No evidence of mitral valve regurgitation.  4. The aortic valve is tricuspid. Aortic valve regurgitation is not visualized. No aortic stenosis is present. FINDINGS  Left Ventricle: Left ventricular ejection fraction, by estimation, is 60 to 65%. The left ventricle has normal function. The left ventricle has no regional wall motion abnormalities. The left ventricular internal cavity size was normal in size. There is  mild left ventricular hypertrophy of the posterior and basal-septal segments. Left ventricular diastolic parameters are consistent with Grade I diastolic dysfunction (impaired relaxation). Indeterminate filling pressures. Right Ventricle: The right ventricular size is normal. No increase in right ventricular wall thickness. Right ventricular systolic function is normal. There is mildly elevated pulmonary artery systolic pressure. The tricuspid regurgitant velocity is 2.75  m/s, and with an assumed right atrial pressure of 3 mmHg, the estimated right ventricular systolic  pressure is 33.2 mmHg. Left Atrium: Left atrial size was normal in size. Right Atrium: Right atrial size was normal in size. Pericardium: There is no evidence of pericardial effusion. Mitral Valve: The mitral valve is degenerative in appearance. There is mild thickening of the mitral valve leaflet(s). Mild mitral annular calcification. No evidence of mitral valve regurgitation. Tricuspid Valve: The tricuspid valve is grossly normal. Tricuspid valve regurgitation is mild. Aortic Valve: The aortic valve is tricuspid. . There is mild thickening of the aortic valve. Aortic valve regurgitation is not visualized. No aortic stenosis is present. Mild aortic valve annular calcification. There is mild thickening of the aortic valve. Pulmonic Valve: The pulmonic valve was grossly normal. Pulmonic valve regurgitation is not visualized. Aorta: The aortic root is normal in size and structure. Venous: The inferior vena cava was not well visualized. IAS/Shunts: The interatrial septum was not well visualized.  LEFT VENTRICLE PLAX 2D LVIDd:         4.78 cm  Diastology LVIDs:         2.04 cm  LV e' lateral:   7.94 cm/s LV PW:         1.25 cm  LV E/e' lateral: 10.4 LV IVS:        0.97 cm  LV e' medial:    7.07 cm/s LVOT diam:     1.90 cm  LV E/e' medial:  11.7 LV SV:         90 LV SV Index:   43 LVOT Area:     2.84 cm  RIGHT VENTRICLE RV Basal diam:  3.00 cm RV S prime:     6.31 cm/s TAPSE (M-mode): 2.4 cm LEFT ATRIUM             Index LA diam:        3.10 cm 1.47 cm/m LA Vol (A2C):   61.1 ml 28.98 ml/m LA Vol (A4C):   49.5 ml 23.48 ml/m LA Biplane Vol: 60.7 ml 28.79 ml/m  AORTIC VALVE LVOT Vmax:   135.00 cm/s LVOT Vmean:  86.300 cm/s LVOT VTI:    0.317 m  AORTA Ao Root diam: 3.10 cm MITRAL VALVE               TRICUSPID VALVE MV Area (PHT): 2.59 cm    TR Peak grad:   30.2 mmHg  MV Decel Time: 293 msec    TR Vmax:        275.00 cm/s MV E velocity: 82.60 cm/s MV A velocity: 96.80 cm/s  SHUNTS MV E/A ratio:  0.85        Systemic VTI:   0.32 m                            Systemic Diam: 1.90 cm Prentice Docker MD Electronically signed by Prentice Docker MD Signature Date/Time: 09/22/2019/2:31:19 PM    Final    [place] EKG SITE RITE  Result Date: 09/28/2019 If Site Rite image not attached, placement could not be confirmed due to current cardiac rhythm.    Microbiology: Recent Results (from the past 240 hour(s))  Respiratory Panel by RT PCR (Flu A&B, Covid) - Nasopharyngeal Swab     Status: None   Collection Time: 09/21/19 10:53 AM   Specimen: Nasopharyngeal Swab  Result Value Ref Range Status   SARS Coronavirus 2 by RT PCR NEGATIVE NEGATIVE Final    Comment: (NOTE) SARS-CoV-2 target nucleic acids are NOT DETECTED. The SARS-CoV-2 RNA is generally detectable in upper respiratoy specimens during the acute phase of infection. The lowest concentration of SARS-CoV-2 viral copies this assay can detect is 131 copies/mL. A negative result does not preclude SARS-Cov-2 infection and should not be used as the sole basis for treatment or other patient management decisions. A negative result may occur with  improper specimen collection/handling, submission of specimen other than nasopharyngeal swab, presence of viral mutation(s) within the areas targeted by this assay, and inadequate number of viral copies (<131 copies/mL). A negative result must be combined with clinical observations, patient history, and epidemiological information. The expected result is Negative. Fact Sheet for Patients:  https://www.moore.com/ Fact Sheet for Healthcare Providers:  https://www.young.biz/ This test is not yet ap proved or cleared by the [place] FDA and  has been authorized for detection and/or diagnosis of SARS-CoV-2 by FDA under an Emergency Use Authorization (EUA). This EUA will remain  in effect (meaning this test can be used) for the duration of the COVID-19 declaration under Section 564(b)(1) of  the Act, 21 U.S.C. section 360bbb-3(b)(1), unless the authorization is terminated or revoked sooner.    Influenza A by PCR NEGATIVE NEGATIVE Final   Influenza B by PCR NEGATIVE NEGATIVE Final    Comment: (NOTE) The Xpert Xpress SARS-CoV-2/FLU/RSV assay is intended as an aid in  the diagnosis of influenza from Nasopharyngeal swab specimens and  should not be used as a sole basis for treatment. Nasal washings and  aspirates are unacceptable for Xpert Xpress SARS-CoV-2/FLU/RSV  testing. Fact Sheet for Patients: https://www.moore.com/ Fact Sheet for Healthcare Providers: https://www.young.biz/ This test is not yet approved or cleared by the [place] FDA and  has been authorized for detection and/or diagnosis of SARS-CoV-2 by  FDA under an Emergency Use Authorization (EUA). This EUA will remain  in effect (meaning this test can be used) for the duration of the  Covid-19 declaration under Section 564(b)(1) of the Act, 21  U.S.C. section 360bbb-3(b)(1), unless the authorization is  terminated or revoked. Performed at [facility], [address], [place], [place] [address]   Surgical pcr screen     Status: Abnormal   Collection Time: 09/26/19  9:09 PM   Specimen: Nasal Mucosa; Nasal Swab  Result Value Ref Range Status   MRSA, PCR NEGATIVE NEGATIVE Final   Staphylococcus aureus POSITIVE (A) NEGATIVE Final  Comment: (NOTE) The Xpert SA Assay (FDA approved for NASAL specimens in patients 2 years of age and older), is one component of a comprehensive surveillance program. It is not intended to diagnose infection nor to guide or monitor treatment. Performed at [facility], [address], [place], [place] [address]   Culture, Urine     Status: None   Collection Time: 09/27/19  8:52 PM   Specimen: Urine, Catheterized  Result Value Ref Range Status   Specimen Description   Final    URINE, CATHETERIZED Performed at [facility], [address] [place], [place] [address]    Special Requests   Final    Normal Performed at [facility], [address] [place], [place] [address]    Culture   Final    NO GROWTH Performed at [facility], [address] [place], [place] [address]    Report Status 09/29/2019 FINAL  Final  Culture, respiratory (non-expectorated)     Status: None (Preliminary result)   Collection Time: 09/28/19  7:45 AM   Specimen: Tracheal Aspirate; Respiratory  Result Value Ref Range Status   Specimen Description   Final    TRACHEAL ASPIRATE Performed at [facility], [address] [place], [place] [address]    Special Requests   Final    NONE Performed at [facility], [address] [place], [place] [address]    Gram Stain   Final    ABUNDANT WBC PRESENT, PREDOMINANTLY PMN RARE GRAM NEGATIVE RODS    Culture   Final    FEW ESCHERICHIA COLI SUSCEPTIBILITIES TO FOLLOW Performed at [facility], [address], [place], [place] [address]    Report Status PENDING  Incomplete  Respiratory Panel by RT PCR (Flu A&B, Covid) - Nasopharyngeal Swab     Status: None   Collection Time: 09/29/19 10:39 AM   Specimen: Nasopharyngeal Swab  Result Value Ref Range Status   SARS Coronavirus 2 by RT PCR NEGATIVE NEGATIVE Final    Comment: (NOTE) SARS-CoV-2 target nucleic acids are NOT DETECTED. The SARS-CoV-2 RNA is generally detectable in upper respiratoy specimens during the acute phase of infection. The lowest concentration of SARS-CoV-2 viral copies this assay can detect is 131 copies/mL. A negative result does not preclude SARS-Cov-2 infection and should not be used as the sole basis for treatment or other patient management decisions. A negative result may occur with  improper specimen collection/handling, submission of specimen other than nasopharyngeal swab, presence of viral mutation(s) within the areas targeted by this assay, and inadequate number of viral copies (<131 copies/mL). A  negative result must be combined with clinical observations, patient history, and epidemiological information. The expected result is Negative. Fact Sheet for Patients:  https://www.moore.com/ Fact Sheet for Healthcare Providers:  https://www.young.biz/ This test is not yet ap proved or cleared by the [place] FDA and  has been authorized for detection and/or diagnosis of SARS-CoV-2 by FDA under an Emergency Use Authorization (EUA). This EUA will remain  in effect (meaning this test can be used) for the duration of the COVID-19 declaration under Section 564(b)(1) of the Act, 21 U.S.C. section 360bbb-3(b)(1), unless the authorization is terminated or revoked sooner.    Influenza A by PCR NEGATIVE NEGATIVE Final   Influenza B by PCR NEGATIVE NEGATIVE Final    Comment: (NOTE) The Xpert Xpress SARS-CoV-2/FLU/RSV assay is intended as an aid in  the diagnosis of influenza from Nasopharyngeal swab specimens and  should not be used as  a sole basis for treatment. Nasal washings and  aspirates are unacceptable for Xpert Xpress SARS-CoV-2/FLU/RSV  testing. Fact Sheet for Patients: https://www.moore.com/ Fact Sheet for Healthcare Providers: https://www.young.biz/ This test is not yet approved or cleared by the [place] FDA and  has been authorized for detection and/or diagnosis of SARS-CoV-2 by  FDA under an Emergency Use Authorization (EUA). This EUA will remain  in effect (meaning this test can be used) for the duration of the  Covid-19 declaration under Section 564(b)(1) of the Act, 21  U.S.C. section 360bbb-3(b)(1), unless the authorization is  terminated or revoked. Performed at [facility], [address], [place], [place] [address]      Labs: Basic Metabolic Panel: Recent Labs  Lab 09/27/19 0857 09/27/19 0857 09/27/19 2118 09/28/19 0726  NA 139  --  140 141  K 3.7   < > 4.7 3.5  CL 102  --   102 105  CO2 24  --  28 24  GLUCOSE 122*  --  167* 157*  BUN 15  --  18 19  CREATININE 0.72  --  1.26* 1.05  CALCIUM 8.3*  --  8.0* 8.0*  MG 2.3  --   --   --    < > = values in this interval not displayed.   Liver Function Tests: Recent Labs  Lab 09/27/19 2118 09/28/19 0726  AST 27 19  ALT 25 21  ALKPHOS 51 45  BILITOT 1.2 1.8*  PROT 5.9* 5.4*  ALBUMIN 3.5 2.9*   No results for input(s): LIPASE, AMYLASE in the last 168 hours. No results for input(s): AMMONIA in the last 168 hours. CBC: Recent Labs  Lab 09/27/19 0857 09/27/19 2118 09/28/19 0418  WBC 8.9 14.2* 12.1*  NEUTROABS  --  12.5*  --   HGB 15.9 15.7 14.6  HCT 46.8 48.4 43.7  MCV 89.0 92.7 89.0  PLT 286 347 262   Cardiac Enzymes: Recent Labs  Lab 09/27/19 2118  CKTOTAL 42*   BNP: Invalid input(s): POCBNP CBG: Recent Labs  Lab 09/27/19 1718 09/27/19 2009 09/27/19 2210 09/28/19 0408 09/28/19 0840  GLUCAP 130* 153* 170* 146* 126*    Time coordinating discharge:  36 minutes  Signed:  Catarina Hartshorn, DO [facility] Pager: [phone] 09/29/2019, 12:43 PM

## 2019-09-29 NOTE — Progress Notes (Signed)
Palliative: [name] is sitting up in bed.  He is being fed ice cream by his daughter [name].  He appears calm and comfortable, but is noted to cough after every bite.  Patient and family aware of risk for aspiration but at this point are wanting to focus on pleasure feedings.  Referral made to residential hospice, [facility], for comfort and dignity at end-of-life.  Conference with attending, bedside nursing staff, transition of care team related to patient condition, needs, goals of care, disposition to residential hospice.  Plan:   Comfort and dignity at end-of-life, residential hospice with hospice of [facility]. Prognosis: 2 weeks or less expected based on stroke, dysphagia, patient and family's desire to focus on comfort and dignity.  25 minutes Lillia Carmel, NP Palliative Medicine Team Team Phone # [phone] Greater than 50% of this time was spent counseling and coordinating care related to the above assessment and plan.

## 2019-09-30 LAB — CULTURE, RESPIRATORY W GRAM STAIN

## 2019-10-02 MED FILL — Medication: Qty: 1 | Status: AC

## 2019-10-07 DEATH — deceased
